# Patient Record
Sex: Female | Born: 1950 | Race: White | Hispanic: No | Smoking: Never smoker
Health system: Southern US, Community
[De-identification: ages and names within clinical notes are randomized; demographics above are authoritative.]

## PROBLEM LIST (undated history)

## (undated) DIAGNOSIS — F410 Panic disorder [episodic paroxysmal anxiety] without agoraphobia: Secondary | ICD-10-CM

## (undated) DIAGNOSIS — F411 Generalized anxiety disorder: Secondary | ICD-10-CM

## (undated) DIAGNOSIS — I10 Essential (primary) hypertension: Secondary | ICD-10-CM

## (undated) DIAGNOSIS — M5430 Sciatica, unspecified side: Secondary | ICD-10-CM

## (undated) DIAGNOSIS — E119 Type 2 diabetes mellitus without complications: Secondary | ICD-10-CM

## (undated) DIAGNOSIS — E039 Hypothyroidism, unspecified: Secondary | ICD-10-CM

## (undated) DIAGNOSIS — M109 Gout, unspecified: Secondary | ICD-10-CM

## (undated) DIAGNOSIS — E079 Disorder of thyroid, unspecified: Secondary | ICD-10-CM

## (undated) DIAGNOSIS — I82409 Acute embolism and thrombosis of unspecified deep veins of unspecified lower extremity: Secondary | ICD-10-CM

## (undated) HISTORY — PX: TONSILLECTOMY: SUR1361

## (undated) HISTORY — PX: CHOLECYSTECTOMY: SHX55

## (undated) HISTORY — PX: ABDOMINAL HYSTERECTOMY: SHX81

---

## 2005-07-05 ENCOUNTER — Emergency Department: Payer: Self-pay | Admitting: Emergency Medicine

## 2005-08-22 ENCOUNTER — Ambulatory Visit (HOSPITAL_BASED_OUTPATIENT_CLINIC_OR_DEPARTMENT_OTHER): Admission: RE | Admit: 2005-08-22 | Discharge: 2005-08-22 | Payer: Self-pay | Admitting: Orthopedic Surgery

## 2006-09-30 ENCOUNTER — Ambulatory Visit: Payer: Self-pay | Admitting: Emergency Medicine

## 2006-10-16 ENCOUNTER — Ambulatory Visit: Payer: Self-pay | Admitting: Internal Medicine

## 2011-08-15 ENCOUNTER — Emergency Department (HOSPITAL_BASED_OUTPATIENT_CLINIC_OR_DEPARTMENT_OTHER): Payer: Self-pay

## 2011-08-15 ENCOUNTER — Encounter (HOSPITAL_BASED_OUTPATIENT_CLINIC_OR_DEPARTMENT_OTHER): Payer: Self-pay | Admitting: *Deleted

## 2011-08-15 ENCOUNTER — Emergency Department (HOSPITAL_BASED_OUTPATIENT_CLINIC_OR_DEPARTMENT_OTHER)
Admission: EM | Admit: 2011-08-15 | Discharge: 2011-08-16 | Disposition: A | Discharge status: Home / Self Care | Payer: Self-pay | Attending: Emergency Medicine | Admitting: Emergency Medicine

## 2011-08-15 DIAGNOSIS — S91109A Unspecified open wound of unspecified toe(s) without damage to nail, initial encounter: Secondary | ICD-10-CM | POA: Insufficient documentation

## 2011-08-15 DIAGNOSIS — IMO0002 Reserved for concepts with insufficient information to code with codable children: Secondary | ICD-10-CM | POA: Insufficient documentation

## 2011-08-15 DIAGNOSIS — L539 Erythematous condition, unspecified: Secondary | ICD-10-CM | POA: Insufficient documentation

## 2011-08-15 DIAGNOSIS — M109 Gout, unspecified: Secondary | ICD-10-CM | POA: Insufficient documentation

## 2011-08-15 DIAGNOSIS — M79609 Pain in unspecified limb: Secondary | ICD-10-CM | POA: Insufficient documentation

## 2011-08-15 HISTORY — DX: Disorder of thyroid, unspecified: E07.9

## 2011-08-15 NOTE — ED Notes (Signed)
Pt c/o left toe pain and injury x last week

## 2011-08-15 NOTE — ED Notes (Signed)
MD at bedside. 

## 2011-08-15 NOTE — ED Provider Notes (Signed)
History     CSN: [id number]  Arrival date & time 08/15/11  2241   First MD Initiated Contact with Patient 08/15/11 2344      Chief Complaint  Patient presents with  . Toe Injury    (Consider location/radiation/quality/duration/timing/severity/associated sxs/prior treatment) HPI This is a 61 year old white female who had her left great toe stepped on about 2 weeks ago. This partially avulse the nail of that toe. She has been treating her toe with elevation and wearing nonconstrictive shoes. Pain in that toe had been improving until this afternoon when she developed severe, burning pain in the left first metatarsophalangeal joint. The pain is worse with movement of the toe, palpation of the joint or ambulation. There is some mild erythema associated with this but no ecchymosis, swelling or warmth. She denies new injury. She has no history of gout.  Past Medical History  Diagnosis Date  . Thyroid disease     Past Surgical History  Procedure Date  . Abdominal hysterectomy   . Tonsillectomy   . Cholecystectomy     History reviewed. No pertinent family history.  History  Substance Use Topics  . Smoking status: Never Smoker   . Smokeless tobacco: Not on file  . Alcohol Use: No    OB History    Grav Para Term Preterm Abortions TAB SAB Ect Mult Living                  Review of Systems  All other systems reviewed and are negative.    Allergies  Ivp dye; Penicillins; and Sulfa antibiotics  Home Medications   Current Outpatient Rx  Name Route Sig Dispense Refill  . THYROID 60 MG PO TABS Oral Take 60 mg by mouth daily.      BP 188/87  Pulse 93  Temp 98.3 F (36.8 C) (Oral)  Ht 5\' 7"  (1.702 m)  Wt 218 lb (98.884 kg)  BMI 34.14 kg/m2  SpO2 100%  Physical Exam General: Well-developed, well-nourished female in no acute distress; appearance consistent with age of record HENT: normocephalic, atraumatic Eyes: Normal appearance Neck: supple Heart: regular rate  and rhythm Lungs: Normal respiratory effort and excursion Abdomen: soft; nondistended Extremities: No deformity; partial avulsion of the left first toenail without significant tenderness; moderate to severe tenderness of the left first metatarsophalangeal joint without warmth, erythema, swelling or ecchymosis Neurologic: Awake, alert and oriented; motor function intact in all extremities and symmetric; no facial droop Skin: Warm and dry Psychiatric: Anxious    ED Course  Procedures (including critical care time)     MDM  Nursing notes and vitals signs, including pulse oximetry, reviewed.  Summary of this visit's results, reviewed by myself:  Results for orders placed during the hospital encounter of 08/15/11  URIC ACID      Component Value Range   Uric Acid, Serum 7.4 (*) 2.4 - 7.0 mg/dL     Imaging Studies: Dg Foot Complete Left  08/15/2011  *RADIOLOGY REPORT*  Clinical Data: Left great toe stepped on by steel-toed boot; burning and pain on palpation.  LEFT FOOT - COMPLETE 3+ VIEW  Comparison: None.  Findings: There is no evidence of fracture or dislocation.  The joint spaces are preserved.  There is no evidence of talar subluxation; the subtalar joint is unremarkable in appearance.  A small os peroneum is noted.  The left great toe is grossly unremarkable in appearance.  No significant soft tissue abnormalities are seen.  IMPRESSION:  1.  No evidence of  fracture or dislocation. 2.  Small os peroneum noted.  Original Report Authenticated By: Tonia Ghent, M.D.   Suspect patient's acute symptoms are due to gout rather than a complication of the patient's distal phalangeal fracture.         Hanley Seamen, MD 08/16/11 [phone]

## 2011-08-16 LAB — URIC ACID: Uric Acid, Serum: 7.4 mg/dL — ABNORMAL HIGH (ref 2.4–7.0)

## 2011-08-16 MED ORDER — NAPROXEN SODIUM 220 MG PO TABS: ORAL_TABLET | ORAL | Status: DC

## 2011-08-16 MED ORDER — HYDROCODONE-ACETAMINOPHEN 5-325 MG PO TABS: 1.0000 | ORAL_TABLET | Freq: Four times a day (QID) | ORAL | Status: AC | PRN

## 2011-08-16 MED ORDER — KETOROLAC TROMETHAMINE 60 MG/2ML IM SOLN
60.0000 mg | Freq: Once | INTRAMUSCULAR | Status: AC
  Administered 2011-08-16: 60 mg via INTRAMUSCULAR
  Filled 2011-08-16: qty 2

## 2011-08-16 NOTE — ED Notes (Signed)
MD at bedside. 

## 2011-08-30 ENCOUNTER — Emergency Department (HOSPITAL_BASED_OUTPATIENT_CLINIC_OR_DEPARTMENT_OTHER)
Admission: EM | Admit: 2011-08-30 | Discharge: 2011-08-31 | Disposition: A | Discharge status: Home / Self Care | Payer: Self-pay | Attending: Emergency Medicine | Admitting: Emergency Medicine

## 2011-08-30 ENCOUNTER — Emergency Department (HOSPITAL_BASED_OUTPATIENT_CLINIC_OR_DEPARTMENT_OTHER): Payer: Self-pay

## 2011-08-30 ENCOUNTER — Encounter (HOSPITAL_BASED_OUTPATIENT_CLINIC_OR_DEPARTMENT_OTHER): Payer: Self-pay

## 2011-08-30 DIAGNOSIS — R0602 Shortness of breath: Secondary | ICD-10-CM | POA: Insufficient documentation

## 2011-08-30 DIAGNOSIS — M546 Pain in thoracic spine: Secondary | ICD-10-CM | POA: Insufficient documentation

## 2011-08-30 DIAGNOSIS — Z7982 Long term (current) use of aspirin: Secondary | ICD-10-CM | POA: Insufficient documentation

## 2011-08-30 DIAGNOSIS — M549 Dorsalgia, unspecified: Secondary | ICD-10-CM

## 2011-08-30 DIAGNOSIS — E079 Disorder of thyroid, unspecified: Secondary | ICD-10-CM | POA: Insufficient documentation

## 2011-08-30 HISTORY — DX: Panic disorder (episodic paroxysmal anxiety): F41.0

## 2011-08-30 HISTORY — DX: Gout, unspecified: M10.9

## 2011-08-30 LAB — CBC WITH DIFFERENTIAL/PLATELET
HCT: 35.5 % — ABNORMAL LOW (ref 36.0–46.0)
Hemoglobin: 12.2 g/dL (ref 12.0–15.0)
Lymphocytes Relative: 39 % (ref 12–46)
Lymphs Abs: 3.1 10*3/uL (ref 0.7–4.0)
Monocytes Absolute: 0.6 10*3/uL (ref 0.1–1.0)
Monocytes Relative: 7 % (ref 3–12)
Neutro Abs: 3.4 10*3/uL (ref 1.7–7.7)
Neutrophils Relative %: 44 % (ref 43–77)
RBC: 3.74 MIL/uL — ABNORMAL LOW (ref 3.87–5.11)

## 2011-08-30 LAB — COMPREHENSIVE METABOLIC PANEL
Albumin: 3.8 g/dL (ref 3.5–5.2)
Alkaline Phosphatase: 88 U/L (ref 39–117)
BUN: 13 mg/dL (ref 6–23)
CO2: 25 mEq/L (ref 19–32)
Chloride: 102 mEq/L (ref 96–112)
Creatinine, Ser: 1 mg/dL (ref 0.50–1.10)
GFR calc non Af Amer: 61 mL/min — ABNORMAL LOW (ref >90)
Glucose, Bld: 173 mg/dL — ABNORMAL HIGH (ref 70–99)
Potassium: 3.3 mEq/L — ABNORMAL LOW (ref 3.5–5.1)
Total Bilirubin: 0.6 mg/dL (ref 0.3–1.2)

## 2011-08-30 LAB — TROPONIN I: Troponin I: <0.3 ng/mL (ref <0.30)

## 2011-08-30 MED ORDER — DIPHENHYDRAMINE HCL 50 MG/ML IJ SOLN
25.0000 mg | Freq: Once | INTRAMUSCULAR | Status: AC
  Administered 2011-08-30: 50 mg via INTRAVENOUS
  Filled 2011-08-30: qty 1

## 2011-08-30 MED ORDER — DIPHENHYDRAMINE HCL 50 MG/ML IJ SOLN
25.0000 mg | Freq: Once | INTRAMUSCULAR | Status: AC
  Administered 2011-08-30: 50 mg via INTRAVENOUS

## 2011-08-30 MED ORDER — HYDROCORTISONE SOD SUCCINATE 100 MG IJ SOLR
200.0000 mg | Freq: Once | INTRAMUSCULAR | Status: AC
  Administered 2011-08-30: 200 mg via INTRAVENOUS
  Filled 2011-08-30 (×2): qty 2

## 2011-08-30 MED ORDER — ONDANSETRON HCL 4 MG/2ML IJ SOLN
4.0000 mg | Freq: Once | INTRAMUSCULAR | Status: AC
  Administered 2011-08-30: 4 mg via INTRAVENOUS
  Filled 2011-08-30: qty 2

## 2011-08-30 MED ORDER — MORPHINE SULFATE 4 MG/ML IJ SOLN
4.0000 mg | Freq: Once | INTRAMUSCULAR | Status: AC
  Administered 2011-08-30: 4 mg via INTRAVENOUS
  Filled 2011-08-30: qty 1

## 2011-08-30 NOTE — ED Provider Notes (Signed)
History     CSN: [id number]  Arrival date & time 08/30/11  4098   First MD Initiated Contact with Patient 08/30/11 2035      Chief Complaint  Patient presents with  . Back Pain    (Consider location/radiation/quality/duration/timing/severity/associated sxs/prior treatment) HPI Pt presents with c/o pain in upper back between her shoulder blades. She states the pain has been present for the past 24 hours, pain is worse with movement and palpation.  She denies any injury.  Pt also states the pain is sharp and worse with deep breathing and cough.  No fevers or productive cough.  No leg swelling.  No hx of DVT/PE, no recent travel or trauma or surgeries, no hormone replacement use.  Denies chest pain or difficulty breathing. Pt very anxious about these symptoms.  Has not taken anything for symptoms prior to arrival.  There are no other associated systemic symptoms, there are no other alleviating or modifying factors.   Past Medical History  Diagnosis Date  . Thyroid disease   . Panic attacks   . Gout     Past Surgical History  Procedure Date  . Abdominal hysterectomy   . Tonsillectomy   . Cholecystectomy     No family history on file.  History  Substance Use Topics  . Smoking status: Never Smoker   . Smokeless tobacco: Not on file  . Alcohol Use: No    OB History    Grav Para Term Preterm Abortions TAB SAB Ect Mult Living                  Review of Systems ROS reviewed and all otherwise negative except for mentioned in HPI  Allergies  Bee venom; Penicillins; Sulfa antibiotics; Hydrocodone; Ivp dye; and Mango flavor  Home Medications   Current Outpatient Rx  Name Route Sig Dispense Refill  . ASPIRIN EC 81 MG PO TBEC Oral Take 81 mg by mouth once.    Marland Kitchen EPINEPHRINE 0.3 MG/0.3ML IJ DEVI Intramuscular Inject 0.3 mg into the muscle as needed. For allergic reaction    . ADULT MULTIVITAMIN W/MINERALS CH Oral Take 1 tablet by mouth daily.    Marland Kitchen NAPROXEN SODIUM 220 MG PO  TABS  Take 2 tablets every 12 hours as needed for pain. Best taken an full stomach.    . THYROID 60 MG PO TABS Oral Take 120 mg by mouth every morning.     . CYCLOBENZAPRINE HCL 5 MG PO TABS Oral Take 1 tablet (5 mg total) by mouth 3 (three) times daily as needed for muscle spasms. 20 tablet 0  . IBUPROFEN 600 MG PO TABS Oral Take 1 tablet (600 mg total) by mouth every 6 (six) hours as needed for pain. 30 tablet 0  . ONDANSETRON HCL 4 MG PO TABS Oral Take 1 tablet (4 mg total) by mouth every 6 (six) hours. 12 tablet 0  . OXYCODONE-ACETAMINOPHEN 5-325 MG PO TABS Oral Take 1-2 tablets by mouth every 6 (six) hours as needed for pain. 15 tablet 0    BP 165/76  Pulse 83  Temp 98.1 F (36.7 C) (Oral)  Resp 18  Ht 5\' 7"  (1.702 m)  Wt 220 lb (99.791 kg)  BMI 34.46 kg/m2  SpO2 96% Vitals reviewed Physical Exam Physical Examination: General appearance - alert, concerned appearing, and in no distress Mental status - alert, oriented to person, place, and time Eyes - no conjunctival injection, no scleral icterus Mouth - mucous membranes moist, pharynx normal without lesions  Chest - clear to auscultation, no wheezes, rales or rhonchi, symmetric air entry Heart - normal rate, regular rhythm, normal S1, S2, no murmurs, rubs, clicks or gallops Abdomen - soft, nontender, nondistended, no masses or organomegaly Back exam - no midline tenderness, ttp over right paraspinous thoracic region and under shoulder blade on right, no CVA tenderness Extremities - peripheral pulses normal, no pedal edema, no clubbing or cyanosis Skin - normal coloration and turgor, no rashes, warm and dry Psych- anxious, tearful, but cooperative  ED Course  Procedures (including critical care time)   Date: 08/30/2011  Rate: 86  Rhythm: normal sinus rhythm  QRS Axis: normal  Intervals: normal  ST/T Wave abnormalities: normal  Conduction Disutrbances: none  Narrative Interpretation: unremarkable, no significant change  compared to prior ekg of 2007     Labs Reviewed  CBC WITH DIFFERENTIAL - Abnormal; Notable for the following:    RBC 3.74 (*)     HCT 35.5 (*)     Eosinophils Relative 8 (*)     All other components within normal limits  COMPREHENSIVE METABOLIC PANEL - Abnormal; Notable for the following:    Potassium 3.3 (*)     Glucose, Bld 173 (*)     GFR calc non Af Amer 61 (*)     GFR calc Af Amer 71 (*)     All other components within normal limits  D-DIMER, QUANTITATIVE - Abnormal; Notable for the following:    D-Dimer, Quant 1.12 (*)     All other components within normal limits  TROPONIN I   Dg Chest 2 View  08/30/2011  *RADIOLOGY REPORT*  Clinical Data: Intermittent interscapular pain for 1 week worsening with deep breathing and coughing.  Mild shortness of breath.  CHEST - 2 VIEW  Comparison: None.  Findings: The heart size and mediastinal contours are normal. There is mild central airway thickening without overt pulmonary edema or confluent airspace opacity.  There is a small calcified granuloma in the left lower lobe.  No pleural effusion or pneumothorax is seen.  There are scattered degenerative changes of the spine.  No acute osseous or paraspinal abnormalities are identified.  Telemetry leads are noted.  IMPRESSION: No acute cardiopulmonary process.  Central airway thickening and left lower lobe granuloma noted.  Original Report Authenticated By: Gerrianne Scale, M.D.   Ct Angio Chest W/cm &/or Wo Cm  08/31/2011  *RADIOLOGY REPORT*  Clinical Data: Shortness of breath, chest pain and back pain.  CT ANGIOGRAPHY CHEST  Technique:  Multidetector CT imaging of the chest using the standard protocol during bolus administration of intravenous contrast. Multiplanar reconstructed images including MIPs were obtained and reviewed to evaluate the vascular anatomy.  Contrast: 80mL OMNIPAQUE IOHEXOL 350 MG/ML SOLN the patient was emergently premedicated with IV steroids and Benadryl prior to the study  due to history of prior contrast allergy.  Comparison: None.  Findings: The pulmonary arteries are adequately opacified.  There is no evidence of pulmonary embolism.  The thoracic aorta is also well opacified and shows no evidence of aneurysmal disease or dissection.  The lungs show scattered scarring and bibasilar atelectasis.  There is a densely calcified granuloma in the left lower lobe measuring approximately 8 mm.  No evidence of pulmonary consolidation, edema or pleural fluid.  Heart size is normal.  No pericardial fluid identified.  No enlarged lymph nodes.  The visualized airway and bronchi are normally patent.  Degenerative changes are present in the spine.  IMPRESSION: No evidence of pulmonary  embolism or other acute process in the chest.  Original Report Authenticated By: Reola Calkins, M.D.     1. Pain, upper back       MDM  Pt presenting with pain in right upper back- seems most c/w muscular pain on examination, but patient with c/o pleuritic nature of symptoms also and quite uncomfortable appearing, d-dimer obtained and elevated.  CT angio ordered. Pt states she has a "sensitivity, not an allergy" to IV contrast and that she needs benadryl before hand.  States she has had benadryl before IV contrast in the past and had no adverse reactions.  Tech d/w radiologist- I have premedicated her with hydrocortisone and benadryl prior to obtaining CT angio.  Other labs and workup reassuring.  Pt's pain improved after pain meds.  If no PE on scan, will treat with ibuprofen, pain meds and muscle relaxer.    12:29 AM  NO PE or other acute process on CT scan.  Of note, patient had NO adverse reaction to the IV contrast.         Ethelda Chick, MD 08/31/11 0030

## 2011-08-30 NOTE — ED Notes (Addendum)
C/o pain between shoulder blades intermittent x 1 week-pt was at [employer] yesterday/had increase in pain last night-drove self here today-c/o jaw feels clenched and nausea-pt drove self to ED-called en route and requested assist upon arrival-pt was assisted by this RN and A Bayse, RN from truck in parking lot to w/c to ED

## 2011-08-30 NOTE — ED Notes (Signed)
Patient transported to X-ray 

## 2011-08-30 NOTE — ED Notes (Signed)
Pt reports intermittent pain in between shoulder blades x 1 week, but has been constant x the past 24hours. Worsens with deep breathing or coughing. Mild SOB-intermittent. Also c/o "flashing lights" in her field of vision, bilateral jaw cramping since yesterday, nausea with no vomiting. Chills, and "fluid on my right side". Pt appears very anxious during exam.

## 2011-08-30 NOTE — ED Notes (Signed)
Pt aware of plan to premedicate with benadryl and solucortef and wait two hours prior to having CT with contrast. Pt reported a reaction involving possible anaphylaxis with a previous scan that had contrast dye

## 2011-08-31 MED ORDER — ONDANSETRON HCL 4 MG PO TABS: 4.0000 mg | ORAL_TABLET | Freq: Four times a day (QID) | ORAL | Status: AC

## 2011-08-31 MED ORDER — OXYCODONE-ACETAMINOPHEN 5-325 MG PO TABS: 1.0000 | ORAL_TABLET | Freq: Four times a day (QID) | ORAL | Status: AC | PRN

## 2011-08-31 MED ORDER — CYCLOBENZAPRINE HCL 5 MG PO TABS: 5.0000 mg | ORAL_TABLET | Freq: Three times a day (TID) | ORAL | Status: AC | PRN

## 2011-08-31 MED ORDER — IBUPROFEN 600 MG PO TABS: 600.0000 mg | ORAL_TABLET | Freq: Four times a day (QID) | ORAL | Status: AC | PRN

## 2011-08-31 MED ORDER — IOHEXOL 350 MG/ML SOLN
80.0000 mL | Freq: Once | INTRAVENOUS | Status: AC | PRN
  Administered 2011-08-31: 80 mL via INTRAVENOUS

## 2013-12-21 DIAGNOSIS — E1165 Type 2 diabetes mellitus with hyperglycemia: Secondary | ICD-10-CM | POA: Insufficient documentation

## 2014-02-05 HISTORY — PX: BACK SURGERY: SHX140

## 2014-03-21 ENCOUNTER — Emergency Department (HOSPITAL_COMMUNITY): Payer: BLUE CROSS/BLUE SHIELD

## 2014-03-21 ENCOUNTER — Emergency Department (HOSPITAL_COMMUNITY)
Admission: EM | Admit: 2014-03-21 | Discharge: 2014-03-21 | Disposition: A | Discharge status: Home / Self Care | Payer: BLUE CROSS/BLUE SHIELD | Attending: Emergency Medicine | Admitting: Emergency Medicine

## 2014-03-21 ENCOUNTER — Encounter (HOSPITAL_COMMUNITY): Payer: Self-pay | Admitting: Emergency Medicine

## 2014-03-21 DIAGNOSIS — R079 Chest pain, unspecified: Secondary | ICD-10-CM | POA: Diagnosis present

## 2014-03-21 DIAGNOSIS — F419 Anxiety disorder, unspecified: Secondary | ICD-10-CM | POA: Diagnosis not present

## 2014-03-21 DIAGNOSIS — Z79899 Other long term (current) drug therapy: Secondary | ICD-10-CM | POA: Diagnosis not present

## 2014-03-21 DIAGNOSIS — E079 Disorder of thyroid, unspecified: Secondary | ICD-10-CM | POA: Insufficient documentation

## 2014-03-21 DIAGNOSIS — R0789 Other chest pain: Secondary | ICD-10-CM | POA: Diagnosis not present

## 2014-03-21 DIAGNOSIS — M791 Myalgia: Secondary | ICD-10-CM | POA: Insufficient documentation

## 2014-03-21 DIAGNOSIS — Z880 Allergy status to penicillin: Secondary | ICD-10-CM | POA: Insufficient documentation

## 2014-03-21 DIAGNOSIS — T148XXA Other injury of unspecified body region, initial encounter: Secondary | ICD-10-CM

## 2014-03-21 DIAGNOSIS — Z7982 Long term (current) use of aspirin: Secondary | ICD-10-CM | POA: Diagnosis not present

## 2014-03-21 LAB — BASIC METABOLIC PANEL
Anion gap: 11 (ref 5–15)
BUN: 22 mg/dL (ref 6–23)
CALCIUM: 9.7 mg/dL (ref 8.4–10.5)
CO2: 24 mmol/L (ref 19–32)
CREATININE: 0.96 mg/dL (ref 0.50–1.10)
Chloride: 101 mmol/L (ref 96–112)
GFR calc Af Amer: 72 mL/min — ABNORMAL LOW (ref >90)
GFR calc non Af Amer: 63 mL/min — ABNORMAL LOW (ref >90)
Glucose, Bld: 127 mg/dL — ABNORMAL HIGH (ref 70–99)
POTASSIUM: 3.5 mmol/L (ref 3.5–5.1)
Sodium: 136 mmol/L (ref 135–145)

## 2014-03-21 LAB — I-STAT TROPONIN, ED
Troponin i, poc: 0.01 ng/mL (ref 0.00–0.08)
Troponin i, poc: 0.01 ng/mL (ref 0.00–0.08)

## 2014-03-21 LAB — CBC
HCT: 38.2 % (ref 36.0–46.0)
Hemoglobin: 12.9 g/dL (ref 12.0–15.0)
MCH: 31.9 pg (ref 26.0–34.0)
MCHC: 33.8 g/dL (ref 30.0–36.0)
MCV: 94.3 fL (ref 78.0–100.0)
Platelets: 270 10*3/uL (ref 150–400)
RBC: 4.05 MIL/uL (ref 3.87–5.11)
RDW: 13.2 % (ref 11.5–15.5)
WBC: 7.4 10*3/uL (ref 4.0–10.5)

## 2014-03-21 LAB — BRAIN NATRIURETIC PEPTIDE: B Natriuretic Peptide: 18.2 pg/mL (ref 0.0–100.0)

## 2014-03-21 LAB — LIPASE, BLOOD: Lipase: 75 U/L — ABNORMAL HIGH (ref 11–59)

## 2014-03-21 MED ORDER — METHOCARBAMOL 500 MG PO TABS
1000.0000 mg | ORAL_TABLET | Freq: Once | ORAL | Status: AC
  Administered 2014-03-21: 1000 mg via ORAL
  Filled 2014-03-21: qty 2

## 2014-03-21 MED ORDER — METHOCARBAMOL 500 MG PO TABS: 1000.0000 mg | ORAL_TABLET | Freq: Three times a day (TID) | ORAL | Status: DC | PRN

## 2014-03-21 NOTE — Discharge Instructions (Signed)
Chest Pain (Nonspecific) °It is often hard to give a specific diagnosis for the cause of chest pain. There is always a chance that your pain could be related to something serious, such as a heart attack or a blood clot in the lungs. You need to follow up with your health care provider for further evaluation. °CAUSES  °· Heartburn. °· Pneumonia or bronchitis. °· Anxiety or stress. °· Inflammation around your heart (pericarditis) or lung (pleuritis or pleurisy). °· A blood clot in the lung. °· A collapsed lung (pneumothorax). It can develop suddenly on its own (spontaneous pneumothorax) or from trauma to the chest. °· Shingles infection (herpes zoster virus). °The chest wall is composed of bones, muscles, and cartilage. Any of these can be the source of the pain. °· The bones can be bruised by injury. °· The muscles or cartilage can be strained by coughing or overwork. °· The cartilage can be affected by inflammation and become sore (costochondritis). °DIAGNOSIS  °Lab tests or other studies may be needed to find the cause of your pain. Your health care provider may have you take a test called an ambulatory electrocardiogram (ECG). An ECG records your heartbeat patterns over a 24-hour period. You may also have other tests, such as: °· Transthoracic echocardiogram (TTE). During echocardiography, sound waves are used to evaluate how blood flows through your heart. °· Transesophageal echocardiogram (TEE). °· Cardiac monitoring. This allows your health care provider to monitor your heart rate and rhythm in real time. °· Holter monitor. This is a portable device that records your heartbeat and can help diagnose heart arrhythmias. It allows your health care provider to track your heart activity for several days, if needed. °· Stress tests by exercise or by giving medicine that makes the heart beat faster. °TREATMENT  °· Treatment depends on what may be causing your chest pain. Treatment may include: °· Acid blockers for  heartburn. °· Anti-inflammatory medicine. °· Pain medicine for inflammatory conditions. °· Antibiotics if an infection is present. °· You may be advised to change lifestyle habits. This includes stopping smoking and avoiding alcohol, caffeine, and chocolate. °· You may be advised to keep your head raised (elevated) when sleeping. This reduces the chance of acid going backward from your stomach into your esophagus. °Most of the time, nonspecific chest pain will improve within 2-3 days with rest and mild pain medicine.  °HOME CARE INSTRUCTIONS  °· If antibiotics were prescribed, take them as directed. Finish them even if you start to feel better. °· For the next few days, avoid physical activities that bring on chest pain. Continue physical activities as directed. °· Do not use any tobacco products, including cigarettes, chewing tobacco, or electronic cigarettes. °· Avoid drinking alcohol. °· Only take medicine as directed by your health care provider. °· Follow your health care provider's suggestions for further testing if your chest pain does not go away. °· Keep any follow-up appointments you made. If you do not go to an appointment, you could develop lasting (chronic) problems with pain. If there is any problem keeping an appointment, call to reschedule. °SEEK MEDICAL CARE IF:  °· Your chest pain does not go away, even after treatment. °· You have a rash with blisters on your chest. °· You have a fever. °SEEK IMMEDIATE MEDICAL CARE IF:  °· You have increased chest pain or pain that spreads to your arm, neck, jaw, back, or abdomen. °· You have shortness of breath. °· You have an increasing cough, or you cough   up blood.  You have severe back or abdominal pain.  You feel nauseous or vomit.  You have severe weakness.  You faint.  You have chills. This is an emergency. Do not wait to see if the pain will go away. Get medical help at once. Call your local emergency services (911 in [place] Do not drive  yourself to the hospital. MAKE SURE YOU:   Understand these instructions.  Will watch your condition.  Will get help right away if you are not doing well or get worse. Document Released: 11/01/2004 Document Revised: 01/27/2013 Document Reviewed: 08/28/2007 [facility] Patient Information 2015 [place], [place]. This information is not intended to replace advice given to you by your health care provider. Make sure you discuss any questions you have with your health care provider.  Muscle Strain A muscle strain is an injury that occurs when a muscle is stretched beyond its normal length. Usually a small number of muscle fibers are torn when this happens. Muscle strain is rated in degrees. First-degree strains have the least amount of muscle fiber tearing and pain. Second-degree and third-degree strains have increasingly more tearing and pain.  Usually, recovery from muscle strain takes 1-2 weeks. Complete healing takes 5-6 weeks.  CAUSES  Muscle strain happens when a sudden, violent force placed on a muscle stretches it too far. This may occur with lifting, sports, or a fall.  RISK FACTORS Muscle strain is especially common in athletes.  SIGNS AND SYMPTOMS At the site of the muscle strain, there may be:  Pain.  Bruising.  Swelling.  Difficulty using the muscle due to pain or lack of normal function. DIAGNOSIS  Your health care provider will perform a physical exam and ask about your medical history. TREATMENT  Often, the best treatment for a muscle strain is resting, icing, and applying cold compresses to the injured area.  HOME CARE INSTRUCTIONS   Use the PRICE method of treatment to promote muscle healing during the first 2-3 days after your injury. The PRICE method involves:  Protecting the muscle from being injured again.  Restricting your activity and resting the injured body part.  Icing your injury. To do this, put ice in a plastic bag. Place a towel between your skin and the  bag. Then, apply the ice and leave it on from 15-20 minutes each hour. After the third day, switch to moist heat packs.  Apply compression to the injured area with a splint or elastic bandage. Be careful not to wrap it too tightly. This may interfere with blood circulation or increase swelling.  Elevate the injured body part above the level of your heart as often as you can.  Only take over-the-counter or prescription medicines for pain, discomfort, or fever as directed by your health care provider.  Warming up prior to exercise helps to prevent future muscle strains. SEEK MEDICAL CARE IF:   You have increasing pain or swelling in the injured area.  You have numbness, tingling, or a significant loss of strength in the injured area. MAKE SURE YOU:   Understand these instructions.  Will watch your condition.  Will get help right away if you are not doing well or get worse. Document Released: 01/22/2005 Document Revised: 11/12/2012 Document Reviewed: 08/21/2012 [facility] Patient Information 2015 [place], [place]. This information is not intended to replace advice given to you by your health care provider. Make sure you discuss any questions you have with your health care provider.  Panic Attacks Panic attacks are sudden, short-livedsurges of  severe anxiety, fear, or discomfort. They may occur for no reason when you are relaxed, when you are anxious, or when you are sleeping. Panic attacks may occur for a number of reasons:   Healthy people occasionally have panic attacks in extreme, life-threatening situations, such as war or natural disasters. Normal anxiety is a protective mechanism of the body that helps us react to danger (fight or flight response).  Panic attacks are often seen with anxiety disorders, such as panic disorder, social anxiety disorder, generalized anxiety disorder, and phobias. Anxiety disorders cause excessive or uncontrollable anxiety. They may interfere with your  relationships or other life activities.  Panic attacks are sometimes seen with other mental illnesses, such as depression and posttraumatic stress disorder.  Certain medical conditions, prescription medicines, and drugs of abuse can cause panic attacks. SYMPTOMS  Panic attacks start suddenly, peak within 20 minutes, and are accompanied by four or more of the following symptoms:  Pounding heart or fast heart rate (palpitations).  Sweating.  Trembling or shaking.  Shortness of breath or feeling smothered.  Feeling choked.  Chest pain or discomfort.  Nausea or strange feeling in your stomach.  Dizziness, light-headedness, or feeling like you will faint.  Chills or hot flushes.  Numbness or tingling in your lips or hands and feet.  Feeling that things are not real or feeling that you are not yourself.  Fear of losing control or going crazy.  Fear of dying. Some of these symptoms can mimic serious medical conditions. For example, you may think you are having a heart attack. Although panic attacks can be very scary, they are not life threatening. DIAGNOSIS  Panic attacks are diagnosed through an assessment by your health care provider. Your health care provider will ask questions about your symptoms, such as where and when they occurred. Your health care provider will also ask about your medical history and use of alcohol and drugs, including prescription medicines. Your health care provider may order blood tests or other studies to rule out a serious medical condition. Your health care provider may refer you to a mental health professional for further evaluation. TREATMENT   Most healthy people who have one or two panic attacks in an extreme, life-threatening situation will not require treatment.  The treatment for panic attacks associated with anxiety disorders or other mental illness typically involves counseling with a mental health professional, medicine, or a combination of  both. Your health care provider will help determine what treatment is best for you.  Panic attacks due to physical illness usually go away with treatment of the illness. If prescription medicine is causing panic attacks, talk with your health care provider about stopping the medicine, decreasing the dose, or substituting another medicine.  Panic attacks due to alcohol or drug abuse go away with abstinence. Some adults need professional help in order to stop drinking or using drugs. HOME CARE INSTRUCTIONS   Take all medicines as directed by your health care provider.   Schedule and attend follow-up visits as directed by your health care provider. It is important to keep all your appointments. SEEK MEDICAL CARE IF:  You are not able to take your medicines as prescribed.  Your symptoms do not improve or get worse. SEEK IMMEDIATE MEDICAL CARE IF:   You experience panic attack symptoms that are different than your usual symptoms.  You have serious thoughts about hurting yourself or others.  You are taking medicine for panic attacks and have a serious side effect. MAKE SURE  YOU:  Understand these instructions.  Will watch your condition.  Will get help right away if you are not doing well or get worse. Document Released: 01/22/2005 Document Revised: 01/27/2013 Document Reviewed: 09/05/2012 [facility] Patient Information 2015 [place], [place]. This information is not intended to replace advice given to you by your health care provider. Make sure you discuss any questions you have with your health care provider.

## 2014-03-21 NOTE — ED Notes (Signed)
Pt. reports left chest pain ( under breast ) radiating to left arm and upper back this evening with mild SOB and nausea.

## 2014-03-21 NOTE — ED Provider Notes (Signed)
CSN: [id number]     Arrival date & time 03/21/14  0239 History   First MD Initiated Contact with Patient 03/21/14 0402     Chief Complaint  Patient presents with  . Chest Pain     (Consider location/radiation/quality/duration/timing/severity/associated sxs/prior Treatment) HPI Patient states she's been under increased stress as of late. This evening she had a sharp left-sided chest pain described as a stab. He was under the left breast. It lasted less than 1 minute. Has completely resolved. Patient became anxious and developed mild shortness of breath. Since that time patient also complains of left sided scapular pain. She describes this pain as dull. It is worse with movement or palpation. She currently has no shortness of breath. No lower extremity swelling or pain. Patient has a history of anxiety disorder for which she takes Xanax twice daily. She is tearful in the room when discussing her mother-in-law.   Past Medical History  Diagnosis Date  . Thyroid disease   . Panic attacks   . Gout    Past Surgical History  Procedure Laterality Date  . Abdominal hysterectomy    . Tonsillectomy    . Cholecystectomy     No family history on file. History  Substance Use Topics  . Smoking status: Never Smoker   . Smokeless tobacco: Not on file  . Alcohol Use: No   OB History    No data available     Review of Systems  Constitutional: Positive for fatigue. Negative for fever and chills.  Respiratory: Positive for shortness of breath. Negative for cough.   Cardiovascular: Positive for chest pain. Negative for palpitations and leg swelling.  Gastrointestinal: Positive for nausea and abdominal pain. Negative for vomiting and diarrhea.  Musculoskeletal: Positive for myalgias and back pain. Negative for neck pain and neck stiffness.  Skin: Negative for rash and wound.  Neurological: Negative for dizziness, weakness, light-headedness, numbness and headaches.  All other systems reviewed and  are negative.     Allergies  Bee venom; Penicillins; Sulfa antibiotics; Hydrocodone; Ivp dye; and Mango flavor  Home Medications   Prior to Admission medications   Medication Sig Start Date End Date Taking? Authorizing Provider  ALPRAZolam Prudy Feeler) 1 MG tablet Take 1 mg by mouth 2 (two) times daily.   Yes Historical Provider, MD  aspirin EC 81 MG tablet Take 81 mg by mouth once.   Yes Historical Provider, MD  Cyanocobalamin (VITAMIN B-12 SL) Place 1 tablet under the tongue daily.   Yes Historical Provider, MD  EPINEPHrine (EPI-PEN) 0.3 mg/0.3 mL DEVI Inject 0.3 mg into the muscle as needed. For allergic reaction   Yes Historical Provider, MD  lisinopril (PRINIVIL,ZESTRIL) 10 MG tablet Take 10 mg by mouth daily.   Yes Historical Provider, MD  metFORMIN (GLUCOPHAGE) 1000 MG tablet Take 500-1,000 mg by mouth See admin instructions. Take 1 tablet in the morning and 0.5 tablet in the evening.   Yes Historical Provider, MD  thyroid (ARMOUR) 60 MG tablet Take 120 mg by mouth every morning.    Yes Historical Provider, MD   BP 139/60 mmHg  Pulse 73  Temp(Src) 97.9 F (36.6 C) (Oral)  Resp 24  Ht  (1.676 m)  Wt 200 lb (90.719 kg)  BMI 32.30 kg/m2  SpO2 94% Physical Exam  Constitutional: She is oriented to person, place, and time. She appears well-developed and well-nourished. No distress.  Patient is tearful and obviously anxious.  HENT:  Head: Normocephalic and atraumatic.  Mouth/Throat: Oropharynx is clear  and moist.  Eyes: EOM are normal. Pupils are equal, round, and reactive to light.  Neck: Normal range of motion. Neck supple.  No meningismus  Cardiovascular: Normal rate and regular rhythm.  Exam reveals no gallop and no friction rub.   No murmur heard. Pulmonary/Chest: Effort normal and breath sounds normal. No respiratory distress. She has no wheezes. She has no rales. She exhibits no tenderness.  Abdominal: Soft. Bowel sounds are normal. She exhibits no distension and no  mass. There is tenderness (patient has very mild epigastric tenderness with palpation.). There is no rebound and no guarding.  Musculoskeletal: Normal range of motion. She exhibits tenderness. She exhibits no edema.  Thoracic back tenderness is reproduced with palpation of the lower border of the left scapula. There is no obvious trauma. No swelling or deformity.  Neurological: She is alert and oriented to person, place, and time.  Patient is alert and oriented x3 with clear, goal oriented speech. Patient has 5/5 motor in all extremities. Sensation is intact to light touch. Bilateral finger-to-nose is normal with no signs of dysmetria. Patient has a normal gait and walks without assistance.  Skin: Skin is warm and dry. No rash noted. No erythema.  Psychiatric: Her behavior is normal.  Nursing note and vitals reviewed.   ED Course  Procedures (including critical care time) Labs Review Labs Reviewed  BASIC METABOLIC PANEL - Abnormal; Notable for the following:    Glucose, Bld 127 (*)    GFR calc non Af Amer 63 (*)    GFR calc Af Amer 72 (*)    All other components within normal limits  LIPASE, BLOOD - Abnormal; Notable for the following:    Lipase 75 (*)    All other components within normal limits  CBC  BRAIN NATRIURETIC PEPTIDE  I-STAT TROPOININ, ED  I-STAT TROPOININ, ED    Imaging Review Dg Chest 2 View  03/21/2014   CLINICAL DATA:  Acute onset of stabbing chest pain, radiating into the left jaw. Weakness and dizziness. Left arm numbness. Shakiness and nausea. Initial encounter.  EXAM: CHEST  2 VIEW  COMPARISON:  Chest radiograph, and CTA of the chest, performed 08/30/2011  FINDINGS: The lungs are well-aerated and clear. There is no evidence of focal opacification, pleural effusion or pneumothorax.  The heart is normal in size; the mediastinal contour is within normal limits. No acute osseous abnormalities are seen. Lateral osteophytes are seen along the lower thoracic spine.   IMPRESSION: No acute cardiopulmonary process seen.   Electronically Signed   By: Roanna RaiderJeffery  Chang M.D.   On: 03/21/2014 03:57     EKG Interpretation   Date/Time:  Sunday March 21 2014 02:46:15 EST Ventricular Rate:  90 PR Interval:  140 QRS Duration: 86 QT Interval:  364 QTC Calculation: 445 R Axis:   -36 Text Interpretation:  Normal sinus rhythm Left axis deviation Moderate  voltage criteria for LVH, may be normal variant Abnormal ECG Confirmed by  Ranae PalmsYELVERTON  MD, Emry Barbato ([id number]) on 03/21/2014 3:34:39 AM Also confirmed by  Ranae PalmsYELVERTON  MD, Davey Limas ([id number])  on 03/21/2014 4:05:32 AM      MDM   Final diagnoses:  Chest pain    Patient presents with multiple symptoms that are now mostly resolved. It seems to be exacerbated by stress and anxiety. Her chest pain lasted less than 1 minute was described as stabbing. She's had no more since. Initial EKG and troponin are normal. Will repeat a 3 hour troponin and if negative a believe that  acute cardiac injury can safely be ruled out. This would be very atypical for coronary artery disease. Patient's back pain is completely reproduced with palpation. This is consistent with muscular skeletal cause for the pain. We'll treat with muscle relaxant. Patient with very mild epigastric tenderness. She also has mild elevation in her lipase. This is of uncertain significance. She has had a cholecystectomy in the past. This will need to be followed by her primary doctor. I have low suspicion for PE in this patient. Her saturations are high 90s with a normal pulse. She's had no recent extended travel or surgeries. No lower extremity swelling or pain.   patient's symptoms have improved. Repeat trop is normal. Pt states she had similar symptoms after [employer] shooting. Will d./c home to f/u with PMD. Also have encouraged pt to see therapist as outpt. Informed of mildy elevated lipase and need to f/u. Return precautions given.   Loren Racer, MD 03/21/14 [phone]

## 2014-03-28 ENCOUNTER — Emergency Department (HOSPITAL_COMMUNITY): Payer: BLUE CROSS/BLUE SHIELD

## 2014-03-28 ENCOUNTER — Emergency Department (HOSPITAL_COMMUNITY)
Admission: EM | Admit: 2014-03-28 | Discharge: 2014-03-28 | Disposition: A | Discharge status: Home / Self Care | Payer: BLUE CROSS/BLUE SHIELD | Attending: Emergency Medicine | Admitting: Emergency Medicine

## 2014-03-28 ENCOUNTER — Encounter (HOSPITAL_COMMUNITY): Payer: Self-pay | Admitting: *Deleted

## 2014-03-28 DIAGNOSIS — F410 Panic disorder [episodic paroxysmal anxiety] without agoraphobia: Secondary | ICD-10-CM | POA: Diagnosis not present

## 2014-03-28 DIAGNOSIS — H938X3 Other specified disorders of ear, bilateral: Secondary | ICD-10-CM | POA: Insufficient documentation

## 2014-03-28 DIAGNOSIS — Z880 Allergy status to penicillin: Secondary | ICD-10-CM | POA: Diagnosis not present

## 2014-03-28 DIAGNOSIS — R05 Cough: Secondary | ICD-10-CM

## 2014-03-28 DIAGNOSIS — E079 Disorder of thyroid, unspecified: Secondary | ICD-10-CM | POA: Diagnosis not present

## 2014-03-28 DIAGNOSIS — M549 Dorsalgia, unspecified: Secondary | ICD-10-CM | POA: Insufficient documentation

## 2014-03-28 DIAGNOSIS — Z7982 Long term (current) use of aspirin: Secondary | ICD-10-CM | POA: Diagnosis not present

## 2014-03-28 DIAGNOSIS — J209 Acute bronchitis, unspecified: Secondary | ICD-10-CM

## 2014-03-28 DIAGNOSIS — Z8739 Personal history of other diseases of the musculoskeletal system and connective tissue: Secondary | ICD-10-CM | POA: Diagnosis not present

## 2014-03-28 DIAGNOSIS — Z79899 Other long term (current) drug therapy: Secondary | ICD-10-CM | POA: Insufficient documentation

## 2014-03-28 DIAGNOSIS — R059 Cough, unspecified: Secondary | ICD-10-CM

## 2014-03-28 LAB — CBG MONITORING, ED: Glucose-Capillary: 119 mg/dL — ABNORMAL HIGH (ref 70–99)

## 2014-03-28 MED ORDER — ACETAMINOPHEN 325 MG PO TABS
650.0000 mg | ORAL_TABLET | Freq: Once | ORAL | Status: AC
  Administered 2014-03-28: 650 mg via ORAL
  Filled 2014-03-28: qty 2

## 2014-03-28 MED ORDER — AEROCHAMBER PLUS W/MASK MISC
1.0000 | Freq: Once | Status: AC
  Administered 2014-03-28: 1
  Filled 2014-03-28: qty 1

## 2014-03-28 MED ORDER — ALBUTEROL SULFATE HFA 108 (90 BASE) MCG/ACT IN AERS
2.0000 | INHALATION_SPRAY | RESPIRATORY_TRACT | Status: DC | PRN
  Administered 2014-03-28: 2 via RESPIRATORY_TRACT
  Filled 2014-03-28: qty 6.7

## 2014-03-28 NOTE — ED Notes (Signed)
She is stressed.  She drove herself here

## 2014-03-28 NOTE — ED Notes (Signed)
The pt was here  Jan 14th and diagnosed with a pulled muscle beneath her lt scapula.  The pain conntinues and she thinks she may have had a temp none now.  She has a productive cough   She cannot sleep due to the coughing.  No labs or xrays at this time recent work-up  .  edp will see

## 2014-03-28 NOTE — ED Provider Notes (Addendum)
CSN: [id number]     Arrival date & time 03/28/14  1630 History   First MD Initiated Contact with Patient 03/28/14 1943     Chief Complaint  Patient presents with  . Cough     (Consider location/radiation/quality/duration/timing/severity/associated sxs/prior Treatment) HPI Complains pain at left parathoracic area onset approximately a week ago. Patient seen in the emergency department for same complaint every 14 2016. Pain is worse  deep inspiration or changing positions. She was treated with Robaxin with out relief. She presents today as she has developed a cough for duct of of green sputum onset yesterday. Maximum temperature approximate 99. No vomiting. No other associated symptoms. Past Medical History  Diagnosis Date  . Thyroid disease   . Panic attacks   . Gout    fibromyalgia Past Surgical History  Procedure Laterality Date  . Abdominal hysterectomy    . Tonsillectomy    . Cholecystectomy     No family history on file. History  Substance Use Topics  . Smoking status: Never Smoker   . Smokeless tobacco: Not on file  . Alcohol Use: No   OB History    No data available     Review of Systems  Constitutional: Negative.   HENT: Negative.        Ears feel clogged  Respiratory: Positive for cough.   Cardiovascular: Negative.   Gastrointestinal: Negative.   Musculoskeletal: Positive for back pain.  Skin: Negative.   Neurological: Negative.   Psychiatric/Behavioral: Negative.   All other systems reviewed and are negative.     Allergies  Bee venom; Penicillins; Sulfa antibiotics; Hydrocodone; and Ivp dye  Home Medications   Prior to Admission medications   Medication Sig Start Date End Date Taking? Authorizing Provider  ALPRAZolam Prudy Feeler) 1 MG tablet Take 1 mg by mouth 2 (two) times daily.   Yes Historical Provider, MD  aspirin EC 81 MG tablet Take 81 mg by mouth daily.    Yes Historical Provider, MD  Cyanocobalamin (VITAMIN B-12 SL) Place 1 tablet under the  tongue daily.   Yes Historical Provider, MD  EPINEPHrine (EPI-PEN) 0.3 mg/0.3 mL DEVI Inject 0.3 mg into the muscle as needed. For allergic reaction   Yes Historical Provider, MD  lisinopril (PRINIVIL,ZESTRIL) 10 MG tablet Take 10 mg by mouth daily.   Yes Historical Provider, MD  metFORMIN (GLUCOPHAGE) 1000 MG tablet Take 500-1,000 mg by mouth See admin instructions. Take 1000 mg in the morning and 500 mgt in the evening.   Yes Historical Provider, MD  methocarbamol (ROBAXIN) 500 MG tablet Take 2 tablets (1,000 mg total) by mouth every 8 (eight) hours as needed for muscle spasms. 03/21/14  Yes Loren Racer, MD  thyroid (ARMOUR) 60 MG tablet Take 120 mg by mouth every morning.    Yes Historical Provider, MD   BP 134/78 mmHg  Pulse 90  Temp(Src) 98.4 F (36.9 C) (Oral)  Resp 20  SpO2 97% Physical Exam  Constitutional: She is oriented to person, place, and time. She appears well-developed and well-nourished.  HENT:  Head: Normocephalic and atraumatic.  Eyes: Conjunctivae are normal. Pupils are equal, round, and reactive to light.  Neck: Neck supple. No tracheal deviation present. No thyromegaly present.  Cardiovascular: Normal rate and regular rhythm.   No murmur heard. Pulmonary/Chest: Effort normal and breath sounds normal.  Abdominal: Soft. Bowel sounds are normal. She exhibits no distension. There is no tenderness.  Obese  Musculoskeletal: Normal range of motion. She exhibits no edema or tenderness.  Neurological:  She is alert and oriented to person, place, and time. No cranial nerve deficit. Coordination normal.  Gait normal  Skin: Skin is warm and dry. No rash noted.  Psychiatric: She has a normal mood and affect.  Nursing note and vitals reviewed.   ED Course  Procedures (including critical care time) Labs Review Labs Reviewed  CBG MONITORING, ED - Abnormal; Notable for the following:    Glucose-Capillary 119 (*)    All other components within normal limits    Imaging  Review No results found.   EKG Interpretation None     9:05 PM requesting Tylenol for headache. Ordered by me. Chest x-ray viewed by me. Results for orders placed or performed during the hospital encounter of 03/28/14  CBG monitoring, ED  Result Value Ref Range   Glucose-Capillary 119 (H) 70 - 99 mg/dL   Dg Chest 2 View  1/61/09602/21/2016   CLINICAL DATA:  Sharp left-sided chest pain for 2 weeks.  Cough.  EXAM: CHEST  2 VIEW  COMPARISON:  03/21/2014  FINDINGS: Normal heart size and pulmonary vascularity. No focal airspace disease or consolidation in the lungs. No blunting of costophrenic angles. No pneumothorax. Mediastinal contours appear intact. Calcified granuloma in the left mid lung superior degenerative changes in the spine.  IMPRESSION: No active cardiopulmonary disease.   Electronically Signed   By: Burman NievesWilliam  Stevens M.D.   On: 03/28/2014 20:44   Dg Chest 2 View  03/21/2014   CLINICAL DATA:  Acute onset of stabbing chest pain, radiating into the left jaw. Weakness and dizziness. Left arm numbness. Shakiness and nausea. Initial encounter.  EXAM: CHEST  2 VIEW  COMPARISON:  Chest radiograph, and CTA of the chest, performed 08/30/2011  FINDINGS: The lungs are well-aerated and clear. There is no evidence of focal opacification, pleural effusion or pneumothorax.  The heart is normal in size; the mediastinal contour is within normal limits. No acute osseous abnormalities are seen. Lateral osteophytes are seen along the lower thoracic spine.  IMPRESSION: No acute cardiopulmonary process seen.   Electronically Signed   By: Roanna RaiderJeffery  Chang M.D.   On: 03/21/2014 03:57    MDM  An albuterol HFA with spacer to go to use 2 puffs every 4 hours when necessary shortness of breath or cough. Back pain is felt to be muscular in etiology Follow-up with her primary care physician is not better week Diagnosis #1 acute bronchitis #2 back pain Final diagnoses:  None        Doug SouSam Brennley Curtice, MD 03/28/14  2114  Doug SouSam Chrystel Barefield, MD 03/28/14 2115

## 2014-03-28 NOTE — Discharge Instructions (Signed)
Acute Bronchitis Use your inhaler 2 puffs every 4 hours as needed for cough or shortness of breath. Take Tylenol as needed for aches. See your primary care physician if not improving in a week Bronchitis is when the airways that extend from the windpipe into the lungs get red, puffy, and painful (inflamed). Bronchitis often causes thick spit (mucus) to develop. This leads to a cough. A cough is the most common symptom of bronchitis. In acute bronchitis, the condition usually begins suddenly and goes away over time (usually in 2 weeks). Smoking, allergies, and asthma can make bronchitis worse. Repeated episodes of bronchitis may cause more lung problems. HOME CARE  Rest.  Drink enough fluids to keep your pee (urine) clear or pale yellow (unless you need to limit fluids as told by your doctor).  Only take over-the-counter or prescription medicines as told by your doctor.  Avoid smoking and secondhand smoke. These can make bronchitis worse. If you are a smoker, think about using nicotine gum or skin patches. Quitting smoking will help your lungs heal faster.  Reduce the chance of getting bronchitis again by:  Washing your hands often.  Avoiding people with cold symptoms.  Trying not to touch your hands to your mouth, nose, or eyes.  Follow up with your doctor as told. GET HELP IF: Your symptoms do not improve after 1 week of treatment. Symptoms include:  Cough.  Fever.  Coughing up thick spit.  Body aches.  Chest congestion.  Chills.  Shortness of breath.  Sore throat. GET HELP RIGHT AWAY IF:   You have an increased fever.  You have chills.  You have severe shortness of breath.  You have bloody thick spit (sputum).  You throw up (vomit) often.  You lose too much body fluid (dehydration).  You have a severe headache.  You faint. MAKE SURE YOU:   Understand these instructions.  Will watch your condition.  Will get help right away if you are not doing well or  get worse. Document Released: 07/11/2007 Document Revised: 09/24/2012 Document Reviewed: 07/15/2012 [facility] Patient Information 2015 [employer], [place]. This information is not intended to replace advice given to you by your health care provider. Make sure you discuss any questions you have with your health care provider.

## 2014-06-22 DIAGNOSIS — F909 Attention-deficit hyperactivity disorder, unspecified type: Secondary | ICD-10-CM | POA: Insufficient documentation

## 2014-06-24 DIAGNOSIS — M48061 Spinal stenosis, lumbar region without neurogenic claudication: Secondary | ICD-10-CM | POA: Insufficient documentation

## 2014-07-12 DIAGNOSIS — I824Z2 Acute embolism and thrombosis of unspecified deep veins of left distal lower extremity: Secondary | ICD-10-CM | POA: Insufficient documentation

## 2014-07-12 DIAGNOSIS — E119 Type 2 diabetes mellitus without complications: Secondary | ICD-10-CM | POA: Insufficient documentation

## 2014-07-12 DIAGNOSIS — G459 Transient cerebral ischemic attack, unspecified: Secondary | ICD-10-CM | POA: Insufficient documentation

## 2014-07-26 ENCOUNTER — Emergency Department (HOSPITAL_COMMUNITY)
Admission: EM | Admit: 2014-07-26 | Discharge: 2014-07-26 | Disposition: A | Discharge status: Home / Self Care | Payer: BLUE CROSS/BLUE SHIELD | Attending: Emergency Medicine | Admitting: Emergency Medicine

## 2014-07-26 ENCOUNTER — Emergency Department (HOSPITAL_COMMUNITY): Payer: BLUE CROSS/BLUE SHIELD

## 2014-07-26 ENCOUNTER — Encounter (HOSPITAL_COMMUNITY): Payer: Self-pay | Admitting: *Deleted

## 2014-07-26 DIAGNOSIS — Z86718 Personal history of other venous thrombosis and embolism: Secondary | ICD-10-CM | POA: Diagnosis not present

## 2014-07-26 DIAGNOSIS — Z8739 Personal history of other diseases of the musculoskeletal system and connective tissue: Secondary | ICD-10-CM | POA: Diagnosis not present

## 2014-07-26 DIAGNOSIS — F1310 Sedative, hypnotic or anxiolytic abuse, uncomplicated: Secondary | ICD-10-CM | POA: Diagnosis not present

## 2014-07-26 DIAGNOSIS — Z7982 Long term (current) use of aspirin: Secondary | ICD-10-CM | POA: Diagnosis not present

## 2014-07-26 DIAGNOSIS — Z7901 Long term (current) use of anticoagulants: Secondary | ICD-10-CM | POA: Insufficient documentation

## 2014-07-26 DIAGNOSIS — F410 Panic disorder [episodic paroxysmal anxiety] without agoraphobia: Secondary | ICD-10-CM | POA: Diagnosis not present

## 2014-07-26 DIAGNOSIS — Z880 Allergy status to penicillin: Secondary | ICD-10-CM | POA: Insufficient documentation

## 2014-07-26 DIAGNOSIS — R200 Anesthesia of skin: Secondary | ICD-10-CM

## 2014-07-26 DIAGNOSIS — E079 Disorder of thyroid, unspecified: Secondary | ICD-10-CM | POA: Diagnosis not present

## 2014-07-26 DIAGNOSIS — Z79899 Other long term (current) drug therapy: Secondary | ICD-10-CM | POA: Diagnosis not present

## 2014-07-26 LAB — URINALYSIS, ROUTINE W REFLEX MICROSCOPIC
BILIRUBIN URINE: NEGATIVE
GLUCOSE, UA: NEGATIVE mg/dL
Hgb urine dipstick: NEGATIVE
Ketones, ur: NEGATIVE mg/dL
Nitrite: NEGATIVE
Protein, ur: NEGATIVE mg/dL
SPECIFIC GRAVITY, URINE: 1.004 — AB (ref 1.005–1.030)
Urobilinogen, UA: 0.2 mg/dL (ref 0.0–1.0)
pH: 6.5 (ref 5.0–8.0)

## 2014-07-26 LAB — RAPID URINE DRUG SCREEN, HOSP PERFORMED
AMPHETAMINES: NOT DETECTED
BENZODIAZEPINES: POSITIVE — AB
Barbiturates: NOT DETECTED
Cocaine: NOT DETECTED
Opiates: NOT DETECTED
Tetrahydrocannabinol: NOT DETECTED

## 2014-07-26 LAB — CBC WITH DIFFERENTIAL/PLATELET
BASOS PCT: 1 % (ref 0–1)
Basophils Absolute: 0.1 10*3/uL (ref 0.0–0.1)
EOS PCT: 12 % — AB (ref 0–5)
Eosinophils Absolute: 0.9 10*3/uL — ABNORMAL HIGH (ref 0.0–0.7)
HCT: 33.7 % — ABNORMAL LOW (ref 36.0–46.0)
HEMOGLOBIN: 11.5 g/dL — AB (ref 12.0–15.0)
LYMPHS ABS: 3.1 10*3/uL (ref 0.7–4.0)
Lymphocytes Relative: 41 % (ref 12–46)
MCH: 33.1 pg (ref 26.0–34.0)
MCHC: 34.1 g/dL (ref 30.0–36.0)
MCV: 97.1 fL (ref 78.0–100.0)
MONOS PCT: 7 % (ref 3–12)
Monocytes Absolute: 0.5 10*3/uL (ref 0.1–1.0)
NEUTROS PCT: 39 % — AB (ref 43–77)
Neutro Abs: 3 10*3/uL (ref 1.7–7.7)
Platelets: 233 10*3/uL (ref 150–400)
RBC: 3.47 MIL/uL — ABNORMAL LOW (ref 3.87–5.11)
RDW: 12.3 % (ref 11.5–15.5)
WBC: 7.6 10*3/uL (ref 4.0–10.5)

## 2014-07-26 LAB — COMPREHENSIVE METABOLIC PANEL
ALT: 17 U/L (ref 14–54)
ANION GAP: 8 (ref 5–15)
AST: 25 U/L (ref 15–41)
Albumin: 3.7 g/dL (ref 3.5–5.0)
Alkaline Phosphatase: 57 U/L (ref 38–126)
BUN: 14 mg/dL (ref 6–20)
CO2: 26 mmol/L (ref 22–32)
Calcium: 9.6 mg/dL (ref 8.9–10.3)
Chloride: 103 mmol/L (ref 101–111)
Creatinine, Ser: 0.76 mg/dL (ref 0.44–1.00)
GFR calc non Af Amer: >60 mL/min (ref >60)
GLUCOSE: 99 mg/dL (ref 65–99)
Potassium: 3.9 mmol/L (ref 3.5–5.1)
SODIUM: 137 mmol/L (ref 135–145)
TOTAL PROTEIN: 7.2 g/dL (ref 6.5–8.1)
Total Bilirubin: 0.7 mg/dL (ref 0.3–1.2)

## 2014-07-26 LAB — CBG MONITORING, ED: Glucose-Capillary: 98 mg/dL (ref 65–99)

## 2014-07-26 LAB — ETHANOL

## 2014-07-26 LAB — URINE MICROSCOPIC-ADD ON

## 2014-07-26 NOTE — ED Notes (Signed)
Pt states that she had spinal surgery and had a clot in her left leg (dc'd 07/15/14), states that she takes xarelto. States that she has been experiencing rt sided numbness off and on for 1 week. States that the current numbness started at 8p.

## 2014-07-26 NOTE — Discharge Instructions (Signed)

## 2014-07-26 NOTE — ED Provider Notes (Signed)
CSN: [id number]     Arrival date & time 07/26/14  0215 History   First MD Initiated Contact with Patient 07/26/14 0234     Chief Complaint  Patient presents with  . Numbness     (Consider location/radiation/quality/duration/timing/severity/associated sxs/prior Treatment) Patient is a 64 y.o. female presenting with neurologic complaint.  Neurologic Problem This is a recurrent problem. The current episode started yesterday. The problem occurs constantly. The problem has been gradually improving. Pertinent negatives include no chest pain, no abdominal pain, no headaches and no shortness of breath. Nothing aggravates the symptoms. Nothing relieves the symptoms. She has tried nothing for the symptoms.    Past Medical History  Diagnosis Date  . Thyroid disease   . Panic attacks   . Gout    Past Surgical History  Procedure Laterality Date  . Abdominal hysterectomy    . Tonsillectomy    . Cholecystectomy     No family history on file. History  Substance Use Topics  . Smoking status: Never Smoker   . Smokeless tobacco: Not on file  . Alcohol Use: No   OB History    No data available     Review of Systems  Respiratory: Negative for shortness of breath.   Cardiovascular: Negative for chest pain.  Gastrointestinal: Negative for abdominal pain.  Neurological: Negative for headaches.  All other systems reviewed and are negative.     Allergies  Bee venom; Penicillins; Sulfa antibiotics; Hydrocodone; and Ivp dye  Home Medications   Prior to Admission medications   Medication Sig Start Date End Date Taking? Authorizing Provider  ALPRAZolam Prudy Feeler) 1 MG tablet Take 1 mg by mouth 2 (two) times daily.   Yes Historical Provider, MD  aspirin EC 81 MG tablet Take 81 mg by mouth daily.    Yes Historical Provider, MD  Cyanocobalamin (VITAMIN B-12 SL) Place 1 tablet under the tongue daily.   Yes Historical Provider, MD  cyclobenzaprine (FLEXERIL) 10 MG tablet Take 10 mg by mouth at  bedtime as needed for muscle spasms.   Yes Historical Provider, MD  lisinopril (PRINIVIL,ZESTRIL) 10 MG tablet Take 10 mg by mouth daily.   Yes Historical Provider, MD  metFORMIN (GLUCOPHAGE) 1000 MG tablet Take 500-1,000 mg by mouth See admin instructions. Take 1000 mg in the morning and 500 mgt in the evening.   Yes Historical Provider, MD  methocarbamol (ROBAXIN) 500 MG tablet Take 2 tablets (1,000 mg total) by mouth every 8 (eight) hours as needed for muscle spasms. 03/21/14  Yes Loren Racer, MD  Rivaroxaban (XARELTO) 15 MG TABS tablet Take 15 mg by mouth 2 (two) times daily with a meal.   Yes Historical Provider, MD  thyroid (ARMOUR) 60 MG tablet Take 120 mg by mouth every morning.    Yes Historical Provider, MD  EPINEPHrine (EPI-PEN) 0.3 mg/0.3 mL DEVI Inject 0.3 mg into the muscle as needed. For allergic reaction    Historical Provider, MD   BP 142/73 mmHg  Pulse 61  Temp(Src) 97.5 F (36.4 C) (Oral)  Resp 21  SpO2 98% Physical Exam  Constitutional: She is oriented to person, place, and time. She appears well-developed and well-nourished.  HENT:  Head: Normocephalic and atraumatic.  Right Ear: External ear normal.  Left Ear: External ear normal.  Eyes: Conjunctivae and EOM are normal. Pupils are equal, round, and reactive to light.  Neck: Normal range of motion. Neck supple.  Cardiovascular: Normal rate, regular rhythm, normal heart sounds and intact distal pulses.   Pulmonary/Chest:  Effort normal and breath sounds normal.  Abdominal: Soft. Bowel sounds are normal. There is no tenderness.  Musculoskeletal: Normal range of motion.  Neurological: She is alert and oriented to person, place, and time. She has normal strength and normal reflexes. A sensory deficit (subj decrease in R side, including forehead) is present. Gait normal.  Skin: Skin is warm and dry.  Vitals reviewed.   ED Course  Procedures (including critical care time) Labs Review Labs Reviewed  CBC WITH  DIFFERENTIAL/PLATELET - Abnormal; Notable for the following:    RBC 3.47 (*)    Hemoglobin 11.5 (*)    HCT 33.7 (*)    Neutrophils Relative % 39 (*)    Eosinophils Relative 12 (*)    Eosinophils Absolute 0.9 (*)    All other components within normal limits  URINE RAPID DRUG SCREEN, HOSP PERFORMED - Abnormal; Notable for the following:    Benzodiazepines POSITIVE (*)    All other components within normal limits  URINALYSIS, ROUTINE W REFLEX MICROSCOPIC (NOT AT [facility]) - Abnormal; Notable for the following:    Specific Gravity, Urine 1.004 (*)    Leukocytes, UA SMALL (*)    All other components within normal limits  COMPREHENSIVE METABOLIC PANEL  ETHANOL  URINE MICROSCOPIC-ADD ON  CBG MONITORING, ED    Imaging Review No results found.   EKG Interpretation None      MDM   Final diagnoses:  Numbness    64 y.o. 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 On arrival the patient has vital signs and physical exam as above. She has no objective neurologic deficit, however has subjective sensory decrease of the entire right side including the forehead.  Her other primary complaint is shaking of the left side of her body, this appears volitional.  Workup as above unremarkable, feel her symptoms likely anxiety mediated versus routine neurologic recovery from spinal procedure, which the patient had prior to onset of symptoms.  Do not feel that this demonstrates an acute radiculopathy or other emergent pathology. Discharged home in stable condition.  I have reviewed all laboratory and imaging studies if ordered as above  1. Numbness         Mirian Mo, MD 07/29/14 [phone]

## 2015-02-06 DIAGNOSIS — I82409 Acute embolism and thrombosis of unspecified deep veins of unspecified lower extremity: Secondary | ICD-10-CM

## 2015-02-06 HISTORY — DX: Acute embolism and thrombosis of unspecified deep veins of unspecified lower extremity: I82.409

## 2016-08-27 ENCOUNTER — Encounter (HOSPITAL_COMMUNITY): Payer: Self-pay | Admitting: *Deleted

## 2016-08-27 ENCOUNTER — Inpatient Hospital Stay (HOSPITAL_COMMUNITY)
Admission: RE | Admit: 2016-08-27 | Discharge: 2016-09-04 | DRG: 885 | Disposition: A | Discharge status: Home / Self Care | Payer: Federal, State, Local not specified - Other | Attending: Psychiatry | Admitting: Psychiatry

## 2016-08-27 DIAGNOSIS — R45851 Suicidal ideations: Secondary | ICD-10-CM | POA: Diagnosis not present

## 2016-08-27 DIAGNOSIS — F325 Major depressive disorder, single episode, in full remission: Secondary | ICD-10-CM | POA: Diagnosis not present

## 2016-08-27 DIAGNOSIS — Z7982 Long term (current) use of aspirin: Secondary | ICD-10-CM | POA: Diagnosis not present

## 2016-08-27 DIAGNOSIS — Z7984 Long term (current) use of oral hypoglycemic drugs: Secondary | ICD-10-CM | POA: Diagnosis not present

## 2016-08-27 DIAGNOSIS — E039 Hypothyroidism, unspecified: Secondary | ICD-10-CM | POA: Diagnosis present

## 2016-08-27 DIAGNOSIS — I10 Essential (primary) hypertension: Secondary | ICD-10-CM | POA: Diagnosis present

## 2016-08-27 DIAGNOSIS — Z79899 Other long term (current) drug therapy: Secondary | ICD-10-CM | POA: Diagnosis not present

## 2016-08-27 DIAGNOSIS — F13239 Sedative, hypnotic or anxiolytic dependence with withdrawal, unspecified: Secondary | ICD-10-CM | POA: Diagnosis not present

## 2016-08-27 DIAGNOSIS — F19939 Other psychoactive substance use, unspecified with withdrawal, unspecified: Secondary | ICD-10-CM | POA: Diagnosis not present

## 2016-08-27 DIAGNOSIS — F329 Major depressive disorder, single episode, unspecified: Secondary | ICD-10-CM | POA: Diagnosis present

## 2016-08-27 DIAGNOSIS — G4700 Insomnia, unspecified: Secondary | ICD-10-CM | POA: Diagnosis not present

## 2016-08-27 DIAGNOSIS — F333 Major depressive disorder, recurrent, severe with psychotic symptoms: Principal | ICD-10-CM | POA: Diagnosis present

## 2016-08-27 DIAGNOSIS — F419 Anxiety disorder, unspecified: Secondary | ICD-10-CM | POA: Diagnosis not present

## 2016-08-27 DIAGNOSIS — F19239 Other psychoactive substance dependence with withdrawal, unspecified: Secondary | ICD-10-CM

## 2016-08-27 LAB — GLUCOSE, CAPILLARY
Glucose-Capillary: 115 mg/dL — ABNORMAL HIGH (ref 65–99)
Glucose-Capillary: 106 mg/dL — ABNORMAL HIGH (ref 65–99)
Glucose-Capillary: 104 mg/dL — ABNORMAL HIGH (ref 65–99)

## 2016-08-27 MED ORDER — THYROID 30 MG PO TABS
90.0000 mg | ORAL_TABLET | Freq: Every morning | ORAL | Status: DC
  Administered 2016-08-28 – 2016-09-04 (×8): 90 mg via ORAL
  Filled 2016-08-27 (×11): qty 3

## 2016-08-27 MED ORDER — ACETAMINOPHEN 325 MG PO TABS
650.0000 mg | ORAL_TABLET | Freq: Four times a day (QID) | ORAL | Status: DC | PRN
  Administered 2016-08-28 – 2016-09-02 (×6): 650 mg via ORAL
  Filled 2016-08-27 (×6): qty 2

## 2016-08-27 MED ORDER — ALUM & MAG HYDROXIDE-SIMETH 200-200-20 MG/5ML PO SUSP: 30.0000 mL | ORAL | Status: DC | PRN

## 2016-08-27 MED ORDER — MAGNESIUM HYDROXIDE 400 MG/5ML PO SUSP: 30.0000 mL | Freq: Every day | ORAL | Status: DC | PRN

## 2016-08-27 MED ORDER — HYDROXYZINE HCL 25 MG PO TABS
25.0000 mg | ORAL_TABLET | Freq: Three times a day (TID) | ORAL | Status: DC | PRN
  Administered 2016-08-27 – 2016-08-28 (×2): 25 mg via ORAL
  Filled 2016-08-27 (×2): qty 1

## 2016-08-27 MED ORDER — METFORMIN HCL 500 MG PO TABS
1000.0000 mg | ORAL_TABLET | Freq: Two times a day (BID) | ORAL | Status: DC
  Administered 2016-08-27 – 2016-08-31 (×3): 1000 mg via ORAL
  Filled 2016-08-27 (×13): qty 2

## 2016-08-27 MED ORDER — LORAZEPAM 0.5 MG PO TABS
0.5000 mg | ORAL_TABLET | Freq: Four times a day (QID) | ORAL | Status: DC | PRN
  Administered 2016-08-27 – 2016-08-28 (×3): 0.5 mg via ORAL
  Filled 2016-08-27 (×5): qty 1

## 2016-08-27 MED ORDER — ASPIRIN EC 81 MG PO TBEC
81.0000 mg | DELAYED_RELEASE_TABLET | Freq: Every day | ORAL | Status: DC
  Administered 2016-08-27 – 2016-09-04 (×9): 81 mg via ORAL
  Filled 2016-08-27 (×11): qty 1

## 2016-08-27 MED ORDER — TRAZODONE HCL 50 MG PO TABS
50.0000 mg | ORAL_TABLET | Freq: Every evening | ORAL | Status: DC | PRN
  Administered 2016-08-27 – 2016-08-28 (×2): 50 mg via ORAL
  Filled 2016-08-27: qty 1

## 2016-08-27 MED ORDER — LISINOPRIL 20 MG PO TABS
20.0000 mg | ORAL_TABLET | Freq: Every day | ORAL | Status: DC
  Administered 2016-08-28 – 2016-09-04 (×8): 20 mg via ORAL
  Filled 2016-08-27 (×10): qty 1

## 2016-08-27 MED ORDER — TRAZODONE HCL 50 MG PO TABS
50.0000 mg | ORAL_TABLET | Freq: Once | ORAL | Status: AC
  Administered 2016-08-27: 50 mg via ORAL
  Filled 2016-08-27: qty 1

## 2016-08-27 MED ORDER — EPINEPHRINE 0.3 MG/0.3ML IJ SOAJ: 0.3000 mg | INTRAMUSCULAR | Status: DC | PRN

## 2016-08-27 NOTE — H&P (Signed)
Behavioral Health Medical Screening Exam  Tiffany Cardenas is an 66 y.o. female who [facility] accompanied by her significant other with complaints of overwhelming anxiety. Patient denies any SI/HI/VAH.   Total Time spent with patient: 30 minutes  Psychiatric Specialty Exam: Physical Exam  Constitutional: She is oriented to person, place, and time. She appears well-developed and well-nourished.  HENT:  Head: Normocephalic.  Eyes: Pupils are equal, round, and reactive to light.  Neck: Normal range of motion. Neck supple.  Cardiovascular: Normal rate and regular rhythm.   Respiratory: Effort normal and breath sounds normal.  GI: Soft. Bowel sounds are normal.  Genitourinary:  Genitourinary Comments: Deferred  Musculoskeletal: Normal range of motion.  Neurological: She is alert and oriented to person, place, and time.  Skin: Skin is warm and dry.    Review of Systems  Psychiatric/Behavioral: Positive for depression. Negative for hallucinations, memory loss, substance abuse and suicidal ideas. The patient is nervous/anxious and has insomnia.   All other systems reviewed and are negative.   Blood pressure (!) 155/87, pulse 82, temperature 98.7 F (37.1 C), temperature source Oral, resp. rate 16, SpO2 99 %.There is no height or weight on file to calculate BMI.  General Appearance: Casual  Eye Contact:  Good  Speech:  Clear and Coherent and Normal Rate  Volume:  Normal  Mood:  Anxious and Depressed  Affect:  Congruent, Depressed and Tearful  Thought Process:  Coherent and Goal Directed  Orientation:  Full (Time, Place, and Person)  Thought Content:  WDL and Logical  Suicidal Thoughts:  No  Homicidal Thoughts:  No  Memory:  Immediate;   Good Recent;   Good Remote;   Fair  Judgement:  Intact  Insight:  Present  Psychomotor Activity:  Normal  Concentration: Concentration: Good and Attention Span: Good  Recall:  Good  Fund of Knowledge:Good  Language: Good   Akathisia:  Negative  Handed:  Right  AIMS (if indicated):     Assets:  Communication Skills Desire for Improvement Financial Resources/Insurance Housing Intimacy Leisure Time Social Support  Sleep:       Musculoskeletal: Strength & Muscle Tone: within normal limits Gait & Station: normal Patient leans: N/A  Blood pressure (!) 155/87, pulse 82, temperature 98.7 F (37.1 C), temperature source Oral, resp. rate 16, SpO2 99 %.  Recommendations:  [facility] with the recommendation for IP and was referred to here for IP hospitalization. Based on my evaluation the patient appears to have an emergency medical condition for which I recommend the patient be transferred to the emergency department for further evaluation.  Delila PereyraJustina A Jamesia Linnen, NP 08/27/2016, 10:29 AM

## 2016-08-27 NOTE — Progress Notes (Signed)
Patient noted to be anxious, tremulous, and tearful.  Patient has taken anitanxitety medications without any relief. Consulted with physician and new orders placed for anti anxiety medications.

## 2016-08-27 NOTE — Tx Team (Signed)
Initial Treatment Plan 08/27/2016 1:51 PM Tiffany Cardenas ZOX:[id number]    PATIENT STRESSORS: Medication change or noncompliance   PATIENT STRENGTHS: Average or above average intelligence Capable of independent living General fund of knowledge Motivation for treatment/growth Supportive family/friends Work skills   PATIENT IDENTIFIED PROBLEMS:   "To feel better."    "I want the panic attacks to go away."               DISCHARGE CRITERIA:  Improved stabilization in mood, thinking, and/or behavior Need for constant or close observation no longer present Verbal commitment to aftercare and medication compliance  PRELIMINARY DISCHARGE PLAN: Outpatient therapy Return to previous living arrangement Return to previous work or school arrangements  PATIENT/FAMILY INVOLVEMENT: This treatment plan has been presented to and reviewed with the patient, Tiffany Cardenas, and/or family member.  The patient and family have been given the opportunity to ask questions and make suggestions.  Lawrence MarseillesFriedman, Dimitrius Steedman Eakes, RN 08/27/2016, 1:51 PM

## 2016-08-27 NOTE — Plan of Care (Signed)
Problem: Safety: Goal: Periods of time without injury will increase Outcome: Not Applicable Date Met: 25/83/46 Patient is not suicidal, no hx of self harm.
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
## 2016-08-28 ENCOUNTER — Encounter (HOSPITAL_COMMUNITY): Payer: Self-pay | Admitting: Behavioral Health

## 2016-08-28 DIAGNOSIS — G4700 Insomnia, unspecified: Secondary | ICD-10-CM

## 2016-08-28 DIAGNOSIS — F333 Major depressive disorder, recurrent, severe with psychotic symptoms: Secondary | ICD-10-CM | POA: Insufficient documentation

## 2016-08-28 DIAGNOSIS — R45851 Suicidal ideations: Secondary | ICD-10-CM

## 2016-08-28 LAB — GLUCOSE, CAPILLARY
GLUCOSE-CAPILLARY: 105 mg/dL — AB (ref 65–99)
Glucose-Capillary: 99 mg/dL (ref 65–99)
Glucose-Capillary: 84 mg/dL (ref 65–99)

## 2016-08-28 MED ORDER — DULOXETINE HCL 30 MG PO CPEP
30.0000 mg | ORAL_CAPSULE | Freq: Every day | ORAL | Status: DC
  Administered 2016-08-28: 30 mg via ORAL
  Filled 2016-08-28 (×4): qty 1

## 2016-08-28 MED ORDER — ADULT MULTIVITAMIN W/MINERALS CH
1.0000 | ORAL_TABLET | Freq: Every day | ORAL | Status: DC
  Administered 2016-08-28 – 2016-09-04 (×8): 1 via ORAL
  Filled 2016-08-28 (×9): qty 1

## 2016-08-28 MED ORDER — HYDROXYZINE HCL 25 MG PO TABS
25.0000 mg | ORAL_TABLET | Freq: Four times a day (QID) | ORAL | Status: AC | PRN
  Administered 2016-08-28 – 2016-08-31 (×5): 25 mg via ORAL
  Filled 2016-08-28 (×5): qty 1

## 2016-08-28 MED ORDER — LORAZEPAM 1 MG PO TABS
1.0000 mg | ORAL_TABLET | Freq: Four times a day (QID) | ORAL | Status: AC
  Administered 2016-08-28 – 2016-08-29 (×3): 1 mg via ORAL
  Filled 2016-08-28 (×4): qty 1

## 2016-08-28 MED ORDER — PRAZOSIN HCL 1 MG PO CAPS
1.0000 mg | ORAL_CAPSULE | Freq: Every day | ORAL | Status: DC
  Administered 2016-08-28 – 2016-09-03 (×6): 1 mg via ORAL
  Filled 2016-08-28 (×8): qty 1

## 2016-08-28 MED ORDER — PRAZOSIN HCL 1 MG PO CAPS
ORAL_CAPSULE | ORAL | Status: AC
  Filled 2016-08-28: qty 1

## 2016-08-28 MED ORDER — LORAZEPAM 1 MG PO TABS
1.0000 mg | ORAL_TABLET | Freq: Four times a day (QID) | ORAL | Status: AC | PRN
  Administered 2016-08-28 – 2016-08-30 (×4): 1 mg via ORAL
  Filled 2016-08-28 (×4): qty 1

## 2016-08-28 MED ORDER — ONDANSETRON 4 MG PO TBDP: 4.0000 mg | ORAL_TABLET | Freq: Four times a day (QID) | ORAL | Status: AC | PRN

## 2016-08-28 MED ORDER — LORAZEPAM 1 MG PO TABS
1.0000 mg | ORAL_TABLET | Freq: Two times a day (BID) | ORAL | Status: AC
  Administered 2016-08-30 – 2016-08-31 (×2): 1 mg via ORAL
  Filled 2016-08-28: qty 1

## 2016-08-28 MED ORDER — THIAMINE HCL 100 MG/ML IJ SOLN
100.0000 mg | Freq: Once | INTRAMUSCULAR | Status: AC
  Administered 2016-08-28: 100 mg via INTRAMUSCULAR
  Filled 2016-08-28: qty 2

## 2016-08-28 MED ORDER — LORAZEPAM 1 MG PO TABS
1.0000 mg | ORAL_TABLET | Freq: Three times a day (TID) | ORAL | Status: AC
  Administered 2016-08-29 – 2016-08-30 (×3): 1 mg via ORAL
  Filled 2016-08-28 (×3): qty 1

## 2016-08-28 MED ORDER — VITAMIN B-1 100 MG PO TABS
100.0000 mg | ORAL_TABLET | Freq: Every day | ORAL | Status: DC
  Administered 2016-08-29 – 2016-09-04 (×7): 100 mg via ORAL
  Filled 2016-08-28 (×9): qty 1

## 2016-08-28 MED ORDER — LOPERAMIDE HCL 2 MG PO CAPS: 2.0000 mg | ORAL_CAPSULE | ORAL | Status: AC | PRN

## 2016-08-28 MED ORDER — LORAZEPAM 1 MG PO TABS
1.0000 mg | ORAL_TABLET | Freq: Every day | ORAL | Status: AC
  Administered 2016-08-31: 1 mg via ORAL
  Filled 2016-08-28 (×2): qty 1

## 2016-08-28 MED ORDER — TRAZODONE HCL 50 MG PO TABS
50.0000 mg | ORAL_TABLET | Freq: Every evening | ORAL | Status: DC | PRN
  Administered 2016-08-28 – 2016-09-03 (×10): 50 mg via ORAL
  Filled 2016-08-28 (×16): qty 1
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
## 2016-08-28 NOTE — Progress Notes (Signed)
At 2345, pt came to the NS stating that she was not able to get back to sleep after sleeping for a short time.  Writer spoke with the provider on call who gave pt repeat dose of Trazodone 50 mg.  Pt is resting in bed with eyes closed at this time.

## 2016-08-28 NOTE — Tx Team (Signed)
Interdisciplinary Treatment and Diagnostic Plan Update  08/28/2016 Time of Session: [place] MRN: [id number]  Principal Diagnosis: MDD, recurrent, severe  Secondary Diagnoses: Active Problems:   MDD (major depressive disorder)   Current Medications:  Current Facility-Administered Medications  Medication Dose Route Frequency Provider Last Rate Last Dose  . acetaminophen (TYLENOL) tablet 650 mg  650 mg Oral Q6H PRN Okonkwo, Justina A, NP      . alum & mag hydroxide-simeth (MAALOX/MYLANTA) 200-200-20 MG/5ML suspension 30 mL  30 mL Oral Q4H PRN Okonkwo, Justina A, NP      . aspirin EC tablet 81 mg  81 mg Oral Daily Okonkwo, Justina A, NP   81 mg at 08/28/16 0841  . EPINEPHrine (EPI-PEN) injection 0.3 mg  0.3 mg Intramuscular PRN Okonkwo, Justina A, NP      . hydrOXYzine (ATARAX/VISTARIL) tablet 25 mg  25 mg Oral TID PRN Lu Duffel, Justina A, NP   25 mg at 08/28/16 0929  . lisinopril (PRINIVIL,ZESTRIL) tablet 20 mg  20 mg Oral Daily Okonkwo, Justina A, NP   20 mg at 08/28/16 0841  . LORazepam (ATIVAN) tablet 0.5 mg  0.5 mg Oral Q6H PRN Cobos, Myer Peer, MD   0.5 mg at 08/28/16 0608  . magnesium hydroxide (MILK OF MAGNESIA) suspension 30 mL  30 mL Oral Daily PRN Okonkwo, Justina A, NP      . metFORMIN (GLUCOPHAGE) tablet 1,000 mg  1,000 mg Oral BID WC Okonkwo, Justina A, NP   1,000 mg at 08/28/16 0840  . thyroid (ARMOUR) tablet 90 mg  90 mg Oral q morning - 10a Okonkwo, Justina A, NP   90 mg at 08/28/16 0924  . traZODone (DESYREL) tablet 50 mg  50 mg Oral QHS PRN Lu Duffel, Justina A, NP   50 mg at 08/27/16 2116   PTA Medications: Prescriptions Prior to Admission  Medication Sig Dispense Refill Last Dose  . ALPRAZolam (XANAX) 1 MG tablet Take 1 mg by mouth 2 (two) times daily.   08/26/2016  . aspirin 81 MG chewable tablet Chew 81 mg by mouth daily.   08/27/2016  . Calcium Carb-Cholecalciferol (CALCIUM 600 + D PO) Take 1 tablet by mouth every morning.   08/26/2016  . Cyanocobalamin  (VITAMIN B-12 SL) Place 1 tablet under the tongue daily.   Past Week  . EPINEPHrine (EPI-PEN) 0.3 mg/0.3 mL DEVI Inject 0.3 mg into the muscle as needed (For allergic reaction).    years ago  . lisinopril (PRINIVIL,ZESTRIL) 20 MG tablet Take 20 mg by mouth every morning.   08/27/2016  . Lysine 500 MG CAPS Take 500 mg by mouth every morning.   08/25/2016  . metFORMIN (GLUCOPHAGE) 1000 MG tablet Take 500 mg by mouth See admin instructions. Takes half a tablet ('500mg'$ ) only if her blood sugar is greater than 140.   08/26/2016  . senna-docusate (SENOKOT-S) 8.6-50 MG tablet Take 2 tablets by mouth as needed for mild constipation.    Past Month  . thyroid (ARMOUR) 60 MG tablet Take 90 mg by mouth every morning.    08/27/2016    Patient Stressors: Medication change or noncompliance  Patient Strengths: Average or above average intelligence Capable of independent living General fund of knowledge Motivation for treatment/growth Supportive family/friends Work skills  Treatment Modalities: Medication Management, Group therapy, Case management,  1 to 1 session with clinician, Psychoeducation, Recreational therapy.   Physician Treatment Plan for Primary Diagnosis:MDD, recurrent, severe  Medication Management: Evaluate patient's response, side effects, and tolerance of medication  regimen.  Therapeutic Interventions: 1 to 1 sessions, Unit Group sessions and Medication administration.  Evaluation of Outcomes: Not Met  Physician Treatment Plan for Secondary Diagnosis: Active Problems:   MDD (major depressive disorder)  Long Term Goal(s):     Short Term Goals:       Medication Management: Evaluate patient's response, side effects, and tolerance of medication regimen.  Therapeutic Interventions: 1 to 1 sessions, Unit Group sessions and Medication administration.  Evaluation of Outcomes: Not Met   RN Treatment Plan for Primary Diagnosis: MDD, recurrent, severe Long Term Goal(s): Knowledge of  disease and therapeutic regimen to maintain health will improve  Short Term Goals: Ability to remain free from injury will improve, Ability to verbalize feelings will improve and Ability to disclose and discuss suicidal ideas  Medication Management: RN will administer medications as ordered by provider, will assess and evaluate patient's response and provide education to patient for prescribed medication. RN will report any adverse and/or side effects to prescribing provider.  Therapeutic Interventions: 1 on 1 counseling sessions, Psychoeducation, Medication administration, Evaluate responses to treatment, Monitor vital signs and CBGs as ordered, Perform/monitor CIWA, COWS, AIMS and Fall Risk screenings as ordered, Perform wound care treatments as ordered.  Evaluation of Outcomes: Not Met   LCSW Treatment Plan for Primary Diagnosis: MDD, recurrent, severe Long Term Goal(s): Safe transition to appropriate next level of care at discharge, Engage patient in therapeutic group addressing interpersonal concerns.  Short Term Goals: Engage patient in aftercare planning with referrals and resources, Facilitate patient progression through stages of change regarding substance use diagnoses and concerns and Identify triggers associated with mental health/substance abuse issues  Therapeutic Interventions: Assess for all discharge needs, 1 to 1 time with Social worker, Explore available resources and support systems, Assess for adequacy in community support network, Educate family and significant other(s) on suicide prevention, Complete Psychosocial Assessment, Interpersonal group therapy.  Evaluation of Outcomes: Not Met   Progress in Treatment: Attending groups: No. New to unit. Continuing to assess.  Participating in groups: No. Taking medication as prescribed: Yes. Toleration medication: Yes. Family/Significant other contact made: No, will contact:  family member if patient consents Patient  understands diagnosis: Yes. Discussing patient identified problems/goals with staff: Yes. Medical problems stabilized or resolved: Yes. Denies suicidal/homicidal ideation: Yes. Issues/concerns per patient self-inventory: No. Other: n/a   New problem(s) identified: No, Describe:  n/a   New Short Term/Long Term Goal(s): detox, medication management for mood stabilization; development of comprehensive mental wellness/sobriety plan.   Discharge Plan or Barriers: CSW assessing for appropriate referrals. Pt sees her PCP only per chart notes.  Reason for Continuation of Hospitalization: Anxiety Depression Medication stabilization Suicidal ideation Withdrawal symptoms  Estimated Length of Stay: Friday 08/31/16  Attendees: Patient: 08/28/2016 10:36 AM  Physician: Dr. Dwyane Dee MD; Dr. Parke Poisson MD 08/28/2016 10:36 AM  Nursing: Minta Balsam RN 08/28/2016 10:36 AM  RN Care Manager: Lars Pinks CM 08/28/2016 10:36 AM  Social Worker: Maxie Better, LCSW 08/28/2016 10:36 AM  Recreational Therapist: x 08/28/2016 10:36 AM  Other: Lindell Spar NP; Mordecai Maes NP; Darnelle Maffucci Money NP 08/28/2016 10:36 AM  Other:  08/28/2016 10:36 AM  Other: 08/28/2016 10:36 AM    Scribe for Treatment Team: Dixon, LCSW 08/28/2016 10:36 AM
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
## 2016-08-28 NOTE — Progress Notes (Signed)
DAR NOTE: Patient presents with anxious affect and depressed mood.  Denies pain, auditory and visual hallucinations.  Described energy level as low and concentration as poor.  Reports withdrawal symptoms of tremors on self inventory form.  Rates depression at 10, hopelessness at 9, and anxiety at 10.  Maintained on routine safety checks.  Medications given as prescribed.  Support and encouragement offered as needed.  States goal for today is "see the doctor".  Patient is withdrawn and isolative to her room most of this shift.
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
## 2016-08-28 NOTE — [facility] Group Notes (Signed)
[facility] LCSW Group Therapy  08/28/2016 3:24 PM  Type of Therapy:  Group Therapy  Participation Level:  Did Not Attend-pt invited. Chose to remain in bed.   Summary of Progress/Problems: MHA Speaker came to talk about his personal journey with substance abuse and addiction. The pt processed ways by which to relate to the speaker. MHA speaker provided handouts and educational information pertaining to groups and services offered by [facility].   Sheralyn Pinegar N Smart LCSW 08/28/2016, 3:24 PM

## 2016-08-29 ENCOUNTER — Encounter (HOSPITAL_COMMUNITY): Payer: Self-pay | Admitting: Behavioral Health

## 2016-08-29 DIAGNOSIS — F13239 Sedative, hypnotic or anxiolytic dependence with withdrawal, unspecified: Secondary | ICD-10-CM

## 2016-08-29 DIAGNOSIS — F419 Anxiety disorder, unspecified: Secondary | ICD-10-CM

## 2016-08-29 LAB — URINALYSIS, ROUTINE W REFLEX MICROSCOPIC
BILIRUBIN URINE: NEGATIVE
GLUCOSE, UA: NEGATIVE mg/dL
HGB URINE DIPSTICK: NEGATIVE
KETONES UR: NEGATIVE mg/dL
LEUKOCYTES UA: NEGATIVE
Nitrite: NEGATIVE
PH: 7 (ref 5.0–8.0)
PROTEIN: NEGATIVE mg/dL
Specific Gravity, Urine: 1.008 (ref 1.005–1.030)

## 2016-08-29 LAB — CBC WITH DIFFERENTIAL/PLATELET
BASOS PCT: 1 %
Basophils Absolute: 0.1 10*3/uL (ref 0.0–0.1)
EOS ABS: 0.3 10*3/uL (ref 0.0–0.7)
EOS PCT: 5 %
HCT: 35.5 % — ABNORMAL LOW (ref 36.0–46.0)
Hemoglobin: 12.4 g/dL (ref 12.0–15.0)
LYMPHS ABS: 1.7 10*3/uL (ref 0.7–4.0)
Lymphocytes Relative: 35 %
MCH: 33.2 pg (ref 26.0–34.0)
MCHC: 34.9 g/dL (ref 30.0–36.0)
MCV: 95.2 fL (ref 78.0–100.0)
Monocytes Absolute: 0.3 10*3/uL (ref 0.1–1.0)
Monocytes Relative: 6 %
Neutro Abs: 2.6 10*3/uL (ref 1.7–7.7)
Neutrophils Relative %: 53 %
PLATELETS: 226 10*3/uL (ref 150–400)
RBC: 3.73 MIL/uL — AB (ref 3.87–5.11)
RDW: 12.6 % (ref 11.5–15.5)
WBC: 4.9 10*3/uL (ref 4.0–10.5)

## 2016-08-29 LAB — COMPREHENSIVE METABOLIC PANEL
ALT: 18 U/L (ref 14–54)
ANION GAP: 9 (ref 5–15)
AST: 19 U/L (ref 15–41)
Albumin: 4.2 g/dL (ref 3.5–5.0)
Alkaline Phosphatase: 64 U/L (ref 38–126)
BUN: 16 mg/dL (ref 6–20)
CHLORIDE: 101 mmol/L (ref 101–111)
CO2: 27 mmol/L (ref 22–32)
CREATININE: 0.89 mg/dL (ref 0.44–1.00)
Calcium: 9.5 mg/dL (ref 8.9–10.3)
GFR calc non Af Amer: >60 mL/min (ref >60)
Glucose, Bld: 157 mg/dL — ABNORMAL HIGH (ref 65–99)
Potassium: 3.9 mmol/L (ref 3.5–5.1)
SODIUM: 137 mmol/L (ref 135–145)
Total Bilirubin: 0.9 mg/dL (ref 0.3–1.2)
Total Protein: 7.2 g/dL (ref 6.5–8.1)

## 2016-08-29 LAB — LIPID PANEL
CHOL/HDL RATIO: 3.5 ratio
Cholesterol: 173 mg/dL (ref 0–200)
HDL: 50 mg/dL (ref >40)
LDL CALC: 107 mg/dL — AB (ref 0–99)
Triglycerides: 78 mg/dL (ref <150)
VLDL: 16 mg/dL (ref 0–40)

## 2016-08-29 LAB — TSH: TSH: 0.55 u[IU]/mL (ref 0.350–4.500)

## 2016-08-29 LAB — ACETAMINOPHEN LEVEL

## 2016-08-29 LAB — GLUCOSE, CAPILLARY
GLUCOSE-CAPILLARY: 136 mg/dL — AB (ref 65–99)
Glucose-Capillary: 144 mg/dL — ABNORMAL HIGH (ref 65–99)

## 2016-08-29 LAB — RAPID URINE DRUG SCREEN, HOSP PERFORMED
AMPHETAMINES: NOT DETECTED
BARBITURATES: NOT DETECTED
Benzodiazepines: POSITIVE — AB
COCAINE: NOT DETECTED
Opiates: NOT DETECTED
TETRAHYDROCANNABINOL: NOT DETECTED

## 2016-08-29 LAB — SALICYLATE LEVEL

## 2016-08-29 LAB — ETHANOL: Alcohol, Ethyl (B): <5 mg/dL (ref <5)

## 2016-08-29 MED ORDER — SERTRALINE HCL 50 MG PO TABS
50.0000 mg | ORAL_TABLET | Freq: Every day | ORAL | Status: DC
  Administered 2016-08-30 – 2016-09-01 (×4): 50 mg via ORAL
  Filled 2016-08-29 (×7): qty 1

## 2016-08-29 MED ORDER — GABAPENTIN 100 MG PO CAPS
100.0000 mg | ORAL_CAPSULE | Freq: Two times a day (BID) | ORAL | Status: DC
  Administered 2016-08-29 – 2016-09-02 (×8): 100 mg via ORAL
  Filled 2016-08-29 (×11): qty 1

## 2016-08-29 NOTE — Progress Notes (Signed)
Recreation Therapy Notes  Date:08/29/2016 Time:9:30am Location: 300 Hall Dayroom  Group Topic: Stress Management  Goal Area(s) Addresses:  Patient will verbalize importance of using healthy stress management.  Patient will identify positive emotions associated with healthy stress management.   Intervention: Stress Management  Activity: Calming Color Relaxation Visualization . Recreation Therapy Intern introduced the stress management technique of visualization. Recreation Therapy Intern read a script that allowed patients to mentally visualize different colors. Recreation Therapy Intern played calming flute music. Patients were to follow along as script was read to engage in the activity.  Education:Stress Management, Discharge Planning.   Education Outcome:Acknowledges edcuation  Clinical Observations/Feedback:Pt did not attend group.  Rachel Meyer, Recreation Therapy Intern  Iliyana Convey, LRT/CTRS 

## 2016-08-29 NOTE — Progress Notes (Signed)
Pt was up at 0240, and came to the NS asking for her "prn" meaning the Ativan.  She was informed that it was too early, but she could get the Vistaril 25 mg which she was given.  Pt seemed a little up set that she could not get the Ativan, and while standing at the med window, stated that she felt like she was going to faint.  Writer called for assistance, and an MHT came to assist pt to her bed.  The vistaril was taken to her room and given.  She then declared that her BP was up and that her blood sugar was too low.  These were checked and her BP was 126/64 and her CBG was 144.  Pt was assured that these were good reading for the middle of the night.  Writer spent a few minutes talking with pt, and she was able to settle down and go back to bed.  Safety checks continue q15 minutes.

## 2016-08-29 NOTE — Progress Notes (Signed)
DAR NOTE: Patient presents with anxious affect and mood.  Reports period of restlessness and brief panic attack.  Patient reminded to use deep breathing exercise.  Patient reports being more anxious when around people and places.  Remained withdrawn and isolative most of this shift.  Denies auditory and visual hallucinations.  Refused Metformin due to fear of blood sugar dropping after use.  RNP made aware.  Maintained on routine safety checks.  Medications given as prescribed.  Support and encouragement offered as needed.

## 2016-08-29 NOTE — [facility] Suicide Risk Assessment (Signed)
[facility] INPATIENT:  Family/Significant Other Suicide Prevention Education  Suicide Prevention Education:  Education Completed; Tora PerchesMark Neese (pt's friend) [phone] has been identified by the patient as the family member/significant other with whom the patient will be residing, and identified as the person(s) who will aid the patient in the event of a mental health crisis (suicidal ideations/suicide attempt).  With written consent from the patient, the family member/significant other has been provided the following suicide prevention education, prior to the and/or following the discharge of the patient.  The suicide prevention education provided includes the following:  Suicide risk factors  Suicide prevention and interventions  National Suicide Hotline telephone number  [facility] assessment telephone number  [facility] Assistance 911  [facility] and/or [facility]idential Mobile Crisis Unit telephone number  Request made of family/significant other to:  Remove weapons (e.g., guns, rifles, knives), all items previously/currently identified as safety concern.    Remove drugs/medications (over-the-counter, prescriptions, illicit drugs), all items previously/currently identified as a safety concern.  The family member/significant other verbalizes understanding of the suicide prevention education information provided.  The family member/significant other agrees to remove the items of safety concern listed above.  Pt's roommate/friend had no further questions or concerns. SPE completed/aftercare plan reviewed. Mark asked to speak with pt's nurse regarding medical information he wished to share. Call then transferred to unit.   Calyx Hawker N Smart LCSW 08/29/2016, 11:04 AM

## 2016-08-29 NOTE — Progress Notes (Signed)
Pt continues to be very anxious this evening.  She has been to the nurse's station often with worried, somatic complaints.  She is very focused on receiving prns and often comes to ask for them before they are due.  She denies SI/HI/AVH at this time, but states her depression and anxiety are very high.  She stays in her room unless she is coming to the nurse's station.  Pt has not been observed in the dayroom this evening.  Pt is worried that the doctor is not going to find medication that will decrease her anxiety as this has been an issue for her since she was in her 8120's.  Support and encouragement offered.  Provider on call made some changes to her meds that was encouraging to her.  Discharge plans are in process.  Safety maintained with q15 minute checks.

## 2016-08-29 NOTE — Progress Notes (Signed)
[facility] MD Progress Note  08/29/2016 12:42 PM Tiffany Cardenas  MRN:  [id number]  Subjective: " I feel horrible. My blood pressure was really high yesterday. I have a headache today. The doctor started my on Cymbalta and its causing my thoughts to race even more. Its also increasing my panic attacks."  Evaluation on the unit: Face to face evaluation completed, case discussed during treatment team, chart reviewed. Patient admitted to [facility]  For worsening depression and anxiety following discontinuation of her psychiatric medications by her PCP. As per patient her Percocet,  Xanax, Ritlan and Adderall.  During this evaluation, patient continues to present very anxious and depressed. She continues to endorse hopelessness and anxiety without improvement. She endorses she continues to feel paranoid although she feels safe here. States, " I feel like when someone hands me a cup, they have spit in it although I know they have not." Denies SI, HI, or AVH and does not appear to be preoccupied with internal stimuli. Cymbalta initiated yesterday by MD and as per patient, she was on Cymbalta in the past with no relief of depression or anxiety. Endorses that while on Cymbalta in the past, she experienced increased anxiety and vision changes (seeing black spots). She endorses poor sleeping pattern and appetite as fair. At this time, she is able to contract for safety on the unit.  Principal Problem: MDD (major depressive disorder), recurrent, severe, with psychosis ([place]) Diagnosis:   Patient Active Problem List   Diagnosis Date Noted  . MDD (major depressive disorder), recurrent, severe, with psychosis ([place]) [F33.3] 08/28/2016    Priority: High  . MDD (major depressive disorder) [F32.9] 08/27/2016   Total Time spent with patient: 20 minutes  Past Psychiatric History: MDD, ADD, Bipolar disorder, GAD, panic attacks. Trial of psychiatric medications include Ritalin, Adderall (reports PCP would alternate the two) and  Xanax. No current therapist, counselor, psychiatrists. Reports multiple acute psychiatric hospitalizations in the past. She reports her last hospitalization was in 2008 for depression. No current therapist, psychiatrists or counselor.  Past Medical History:  Past Medical History:  Diagnosis Date  . Gout   . Panic attacks   . Thyroid disease     Past Surgical History:  Procedure Laterality Date  . ABDOMINAL HYSTERECTOMY    . CHOLECYSTECTOMY    . TONSILLECTOMY     Family History: History reviewed. No pertinent family history. Family Psychiatric  History: Unknown as per patient Social History:  History  Alcohol Use No     History  Drug Use No    Social History   Social History  . Marital status: Married    Spouse name: N/A  . Number of children: N/A  . Years of education: N/A   Social History Main Topics  . Smoking status: Never Smoker  . Smokeless tobacco: Never Used  . Alcohol use No  . Drug use: No  . Sexual activity: Not Asked   Other Topics Concern  . None   Social History Narrative  . None   Additional Social History:    Pain Medications: PCP just stopped prescribing her percocet Prescriptions: PCP stopping prescribing her ritlin and wants her to detox from xanex History of alcohol / drug use?: No history of alcohol / drug abuse    Sleep: Poor  Appetite:  Fair  Current Medications: Current Facility-Administered Medications  Medication Dose Route Frequency Provider Last Rate Last Dose  . acetaminophen (TYLENOL) tablet 650 mg  650 mg Oral Q6H PRN Vicenta Aly, NP  650 mg at 08/29/16 0932  . alum & mag hydroxide-simeth (MAALOX/MYLANTA) 200-200-20 MG/5ML suspension 30 mL  30 mL Oral Q4H PRN Okonkwo, Justina A, NP      . aspirin EC tablet 81 mg  81 mg Oral Daily Okonkwo, Justina A, NP   81 mg at 08/29/16 0932  . DULoxetine (CYMBALTA) DR capsule 30 mg  30 mg Oral Daily Phala Schraeder, Myer Peer, MD   30 mg at 08/28/16 1539  . EPINEPHrine (EPI-PEN) injection  0.3 mg  0.3 mg Intramuscular PRN Okonkwo, Justina A, NP      . hydrOXYzine (ATARAX/VISTARIL) tablet 25 mg  25 mg Oral Q6H PRN Chayla Shands, Myer Peer, MD   25 mg at 08/29/16 0235  . lisinopril (PRINIVIL,ZESTRIL) tablet 20 mg  20 mg Oral Daily Okonkwo, Justina A, NP   20 mg at 08/29/16 0932  . loperamide (IMODIUM) capsule 2-4 mg  2-4 mg Oral PRN Kelda Azad, Myer Peer, MD      . LORazepam (ATIVAN) tablet 1 mg  1 mg Oral Q6H PRN Trestin Vences, Myer Peer, MD   1 mg at 08/29/16 6213  . LORazepam (ATIVAN) tablet 1 mg  1 mg Oral QID Shakina Choy, Myer Peer, MD   1 mg at 08/29/16 1207   Followed by  . LORazepam (ATIVAN) tablet 1 mg  1 mg Oral TID Jmarion Christiano, Myer Peer, MD       Followed by  . [START ON 08/30/2016] LORazepam (ATIVAN) tablet 1 mg  1 mg Oral BID Tashanna Dolin, Myer Peer, MD       Followed by  . [START ON 09/01/2016] LORazepam (ATIVAN) tablet 1 mg  1 mg Oral Daily Latosha Gaylord A, MD      . magnesium hydroxide (MILK OF MAGNESIA) suspension 30 mL  30 mL Oral Daily PRN Okonkwo, Justina A, NP      . metFORMIN (GLUCOPHAGE) tablet 1,000 mg  1,000 mg Oral BID WC Okonkwo, Justina A, NP   1,000 mg at 08/29/16 0935  . multivitamin with minerals tablet 1 tablet  1 tablet Oral Daily Briyah Wheelwright, Myer Peer, MD   1 tablet at 08/29/16 0933  . ondansetron (ZOFRAN-ODT) disintegrating tablet 4 mg  4 mg Oral Q6H PRN Gaile Allmon A, MD      . prazosin (MINIPRESS) capsule 1 mg  1 mg Oral QHS Patriciaann Clan E, PA-C   1 mg at 08/28/16 2310  . thiamine (VITAMIN B-1) tablet 100 mg  100 mg Oral Daily Jackelynn Hosie, Myer Peer, MD   100 mg at 08/29/16 0933  . thyroid (ARMOUR) tablet 90 mg  90 mg Oral q morning - 10a Okonkwo, Justina A, NP   90 mg at 08/29/16 0934  . traZODone (DESYREL) tablet 50 mg  50 mg Oral QHS,MR X 1 Laverle Hobby, PA-C   50 mg at 08/28/16 2310    Lab Results:  Results for orders placed or performed during the hospital encounter of 08/27/16 (from the past 48 hour(s))  Glucose, capillary     Status: Abnormal   Collection Time:  08/27/16  5:26 PM  Result Value Ref Range   Glucose-Capillary 106 (H) 65 - 99 mg/dL   Comment 1 Notify RN    Comment 2 Document in Chart   Glucose, capillary     Status: Abnormal   Collection Time: 08/27/16  8:43 PM  Result Value Ref Range   Glucose-Capillary 104 (H) 65 - 99 mg/dL  Glucose, capillary     Status: None   Collection Time: 08/28/16  6:06  AM  Result Value Ref Range   Glucose-Capillary 99 65 - 99 mg/dL  Glucose, capillary     Status: None   Collection Time: 08/28/16  2:34 PM  Result Value Ref Range   Glucose-Capillary 84 65 - 99 mg/dL  Urine rapid drug screen (hosp performed)     Status: Abnormal   Collection Time: 08/28/16  4:11 PM  Result Value Ref Range   Opiates NONE DETECTED NONE DETECTED   Cocaine NONE DETECTED NONE DETECTED   Benzodiazepines POSITIVE (A) NONE DETECTED   Amphetamines NONE DETECTED NONE DETECTED   Tetrahydrocannabinol NONE DETECTED NONE DETECTED   Barbiturates NONE DETECTED NONE DETECTED    Comment:        DRUG SCREEN FOR MEDICAL PURPOSES ONLY.  IF CONFIRMATION IS NEEDED FOR ANY PURPOSE, NOTIFY LAB WITHIN 5 DAYS.        LOWEST DETECTABLE LIMITS FOR URINE DRUG SCREEN Drug Class       Cutoff (ng/mL) Amphetamine      1000 Barbiturate      200 Benzodiazepine   200 Tricyclics       300 Opiates          300 Cocaine          300 THC              50 Performed at [facility], [address], [place], [place] [address]   Urinalysis, Routine w reflex microscopic     Status: Abnormal   Collection Time: 08/28/16  4:11 PM  Result Value Ref Range   Color, Urine STRAW (A) YELLOW   APPearance CLEAR CLEAR   Specific Gravity, Urine 1.008 1.005 - 1.030   pH 7.0 5.0 - 8.0   Glucose, UA NEGATIVE NEGATIVE mg/dL   Hgb urine dipstick NEGATIVE NEGATIVE   Bilirubin Urine NEGATIVE NEGATIVE   Ketones, ur NEGATIVE NEGATIVE mg/dL   Protein, ur NEGATIVE NEGATIVE mg/dL   Nitrite NEGATIVE NEGATIVE   Leukocytes, UA NEGATIVE NEGATIVE     Comment: Performed at [facility], [address] [place], [place] [address]  Glucose, capillary     Status: Abnormal   Collection Time: 08/28/16  5:19 PM  Result Value Ref Range   Glucose-Capillary 105 (H) 65 - 99 mg/dL   Comment 1 Notify RN   Glucose, capillary     Status: Abnormal   Collection Time: 08/29/16  2:42 AM  Result Value Ref Range   Glucose-Capillary 144 (H) 65 - 99 mg/dL  Glucose, capillary     Status: Abnormal   Collection Time: 08/29/16  6:05 AM  Result Value Ref Range   Glucose-Capillary 136 (H) 65 - 99 mg/dL  TSH     Status: None   Collection Time: 08/29/16  6:18 AM  Result Value Ref Range   TSH 0.550 0.350 - 4.500 uIU/mL    Comment: Performed by a 3rd Generation assay with a functional sensitivity of <=0.01 uIU/mL. Performed at [facility], [address] [place], [place] [address]   Lipid panel     Status: Abnormal   Collection Time: 08/29/16  6:18 AM  Result Value Ref Range   Cholesterol 173 0 - 200 mg/dL   Triglycerides 78 <930 mg/dL   HDL 50 >50 mg/dL   Total CHOL/HDL Ratio 3.5 RATIO   VLDL 16 0 - 40 mg/dL   LDL Cholesterol 159 (H) 0 - 99 mg/dL    Comment:        Total Cholesterol/HDL:CHD Risk  Coronary Heart Disease Risk Table                     Men   Women  1/2 Average Risk   3.4   3.3  Average Risk       5.0   4.4  2 X Average Risk   9.6   7.1  3 X Average Risk  23.4   11.0        Use the calculated Patient Ratio above and the CHD Risk Table to determine the patient's CHD Risk.        ATP III CLASSIFICATION (LDL):  <100     mg/dL   Optimal  100-129  mg/dL   Near or Above                    Optimal  130-159  mg/dL   Borderline  160-189  mg/dL   High  >190     mg/dL   Very High Performed at [place] [address] [place], [place] [address]   CBC with Differential/Platelet     Status: Abnormal   Collection Time: 08/29/16  6:18 AM  Result Value Ref Range   WBC 4.9 4.0 - 10.5 K/uL   RBC  3.73 (L) 3.87 - 5.11 MIL/uL   Hemoglobin 12.4 12.0 - 15.0 g/dL   HCT 35.5 (L) 36.0 - 46.0 %   MCV 95.2 78.0 - 100.0 fL   MCH 33.2 26.0 - 34.0 pg   MCHC 34.9 30.0 - 36.0 g/dL   RDW 12.6 11.5 - 15.5 %   Platelets 226 150 - 400 K/uL   Neutrophils Relative % 53 %   Neutro Abs 2.6 1.7 - 7.7 K/uL   Lymphocytes Relative 35 %   Lymphs Abs 1.7 0.7 - 4.0 K/uL   Monocytes Relative 6 %   Monocytes Absolute 0.3 0.1 - 1.0 K/uL   Eosinophils Relative 5 %   Eosinophils Absolute 0.3 0.0 - 0.7 K/uL   Basophils Relative 1 %   Basophils Absolute 0.1 0.0 - 0.1 K/uL    Comment: Performed at [facility], [place] [address] [place], [place] [address]  Comprehensive metabolic panel     Status: Abnormal   Collection Time: 08/29/16  6:18 AM  Result Value Ref Range   Sodium 137 135 - 145 mmol/L   Potassium 3.9 3.5 - 5.1 mmol/L   Chloride 101 101 - 111 mmol/L   CO2 27 22 - 32 mmol/L   Glucose, Bld 157 (H) 65 - 99 mg/dL   BUN 16 6 - 20 mg/dL   Creatinine, Ser 0.89 0.44 - 1.00 mg/dL   Calcium 9.5 8.9 - 10.3 mg/dL   Total Protein 7.2 6.5 - 8.1 g/dL   Albumin 4.2 3.5 - 5.0 g/dL   AST 19 15 - 41 U/L   ALT 18 14 - 54 U/L   Alkaline Phosphatase 64 38 - 126 U/L   Total Bilirubin 0.9 0.3 - 1.2 mg/dL   GFR calc non Af Amer >60 >60 mL/min   GFR calc Af Amer >60 >60 mL/min    Comment: (NOTE) The eGFR has been calculated using the CKD EPI equation. This calculation has not been validated in all clinical situations. eGFR's persistently <60 mL/min signify possible Chronic Kidney Disease.    Anion gap 9 5 - 15    Comment: Performed at [facility], [place] [address], [place], [place] [address]  Ethanol  Status: None   Collection Time: 08/29/16  6:18 AM  Result Value Ref Range   Alcohol, Ethyl (B) <5 <5 mg/dL    Comment:        LOWEST DETECTABLE LIMIT FOR SERUM ALCOHOL IS 5 mg/dL FOR MEDICAL PURPOSES ONLY Performed at [place] [address]  [place], [place] [address]   Salicylate level     Status: None   Collection Time: 08/29/16  6:18 AM  Result Value Ref Range   Salicylate Lvl <3.8 2.8 - 30.0 mg/dL    Comment: Performed at [facility], [place] [address], [place], [place] [address]  Acetaminophen level     Status: Abnormal   Collection Time: 08/29/16  6:18 AM  Result Value Ref Range   Acetaminophen (Tylenol), Serum <10 (L) 10 - 30 ug/mL    Comment:        THERAPEUTIC CONCENTRATIONS VARY SIGNIFICANTLY. A RANGE OF 10-30 ug/mL MAY BE AN EFFECTIVE CONCENTRATION FOR MANY PATIENTS. HOWEVER, SOME ARE BEST TREATED AT CONCENTRATIONS OUTSIDE THIS RANGE. ACETAMINOPHEN CONCENTRATIONS >150 ug/mL AT 4 HOURS AFTER INGESTION AND >50 ug/mL AT 12 HOURS AFTER INGESTION ARE OFTEN ASSOCIATED WITH TOXIC REACTIONS. Performed at [facility], [place] [address] [place], [place] [address]     Blood Alcohol level:  Lab Results  Component Value Date   ETH <5 08/29/2016   ETH <5 52/77/8242    Metabolic Disorder Labs: No results found for: HGBA1C, MPG No results found for: PROLACTIN Lab Results  Component Value Date   CHOL 173 08/29/2016   TRIG 78 08/29/2016   HDL 50 08/29/2016   CHOLHDL 3.5 08/29/2016   VLDL 16 08/29/2016   LDLCALC 107 (H) 08/29/2016    Physical Findings: AIMS: Facial and Oral Movements Muscles of Facial Expression: None, normal Lips and Perioral Area: None, normal Jaw: None, normal Tongue: None, normal,Extremity Movements Upper (arms, wrists, hands, fingers): None, normal Lower (legs, knees, ankles, toes): None, normal, Trunk Movements Neck, shoulders, hips: None, normal, Overall Severity Severity of abnormal movements (highest score from questions above): None, normal Incapacitation due to abnormal movements: None, normal Patient's awareness of abnormal movements (rate only patient's report): No Awareness, Dental Status Current problems with teeth and/or dentures?: No Does  patient usually wear dentures?: No  CIWA:  CIWA-Ar Total: 10 COWS:  COWS Total Score: 10  Musculoskeletal: Strength & Muscle Tone: within normal limits Gait & Station: normal Patient leans: N/A  Psychiatric Specialty Exam: Physical Exam  Nursing note and vitals reviewed. Constitutional: She is oriented to person, place, and time.  Neurological: She is alert and oriented to person, place, and time.    Review of Systems  Psychiatric/Behavioral: Positive for depression and substance abuse. Negative for hallucinations, memory loss and suicidal ideas. The patient is nervous/anxious and has insomnia.   All other systems reviewed and are negative.   Blood pressure (!) 137/59, pulse (!) 116, temperature 98.6 F (37 C), temperature source Oral, resp. rate 18, height '5\' 6"'$  (1.676 m), weight 190 lb (86.2 kg), SpO2 99 %.Body mass index is 30.67 kg/m.  General Appearance: Fairly Groomed  Eye Contact:  Fair  Speech:  Clear and Coherent and Normal Rate  Volume:  Decreased  Mood:  Anxious and Depressed  Affect:  Depressed and anxious  Thought Process:  Coherent, Linear and Descriptions of Associations: Intact  Orientation:  Full (Time, Place, and Person)  Thought Content:  Paranoid Ideation  Suicidal Thoughts:  No  Homicidal Thoughts:  No  Memory:  Immediate;   Fair Recent;   Fair  Judgement:  Fair  Insight:  Fair  Psychomotor Activity:  mild tremors noted   Concentration:  Concentration: Fair and Attention Span: Fair  Recall:  [employer] of Knowledge:  Fair  Language:  Good  Akathisia:  Negative  Handed:  Right  AIMS (if indicated):     Assets:  Desire for Improvement Resilience  ADL's:  Intact  Cognition:  WNL  Sleep:  Number of Hours: 5.25     Treatment Plan Summary: Daily contact with patient to assess and evaluate symptoms and progress in treatment   Medication management: Psychiatric conditions are unstable at this time. To reduce current symptoms to base line and  improve the patient's overall level of functioning will continue the following treatment plan with adjustments as noted;   Continue Ativan detox protocol to minimize risk of severe BZD withdrawal  Discontinue Cymbalta 30 mgrs QDAY  for depression, anxiety , history of fibromyalgia. Patient endorses possible side effects related to administration. Discussed presenting concerns with MD. We agreed to start a trial of Zoloft 50 mg po daily for depression and anxiety/   Will start Neurontin 100 mg po bid for anxiety.   Other:  Safety: Will continue 15 minute observation for safety checks. Patient is able to contract for safety on the unit at this time  Labs:TSH normal, HgbA1c, lipid panel LDL 107, CMP glucose 157, CBC low RBC and HCT, ethanol WNL, UDS, UA, salicylate normal. acetaminophen level WNL.  Continue to develop treatment plan to decrease risk of relapse upon discharge and to reduce the need for readmission.  Psycho-social education regarding relapse prevention and self care.  Health care follow up as needed for medical problems.   Continue to attend and participate in therapy.     Mordecai Maes, NP 08/29/2016, 12:42 PM  Agree with  NP Progress Note

## 2016-08-30 ENCOUNTER — Encounter (HOSPITAL_COMMUNITY): Payer: Self-pay | Admitting: Behavioral Health

## 2016-08-30 LAB — GLUCOSE, CAPILLARY
Glucose-Capillary: 105 mg/dL — ABNORMAL HIGH (ref 65–99)
Glucose-Capillary: 161 mg/dL — ABNORMAL HIGH (ref 65–99)

## 2016-08-30 LAB — HEMOGLOBIN A1C
Hgb A1c MFr Bld: 5.6 % (ref 4.8–5.6)
Mean Plasma Glucose: 114 mg/dL

## 2016-08-30 NOTE — Progress Notes (Signed)
[facility] MD Progress Note  08/30/2016 10:23 AM Tiffany Cardenas  MRN:  [id number]  Subjective: " I am having a hard day. I am still withdrawing. I feel shaky. I am scared and paranoid because I came off my medications to fast."  Evaluation on the unit: Face to face evaluation completed, case discussed during treatment team, chart reviewed. Patient admitted to [facility] for worsening depression and anxiety following discontinuation of her psychiatric medications by her PCP.   During this evaluation, her psychiatric condition shows no improvement. Patient remains very anxious and paranoid. She is focused on the discontinuation of her medications and the adverse effects that could occur. She continues to present as restless and continues to endorse panic like symptoms throughout the day. She endorses depression as 10, anxiety as 10, and hopelessness as 9. She becomes very paranoid when discussing medications or adjustments. She was switched from Cymbalta to Zoloft starting today and thus far, seems to be tolerating well. She denies suicidal ideations with plan or intent, homicidal ideas, AVH. There are no signs of response to internal stimuli. Reports sleeping pattern as good and appetite as fair. She remains isolative with little participation in unit milieu.  At this time, she is able to contract for safety on the unit.  Patient continues to decline Metformin. She has insisted since admission that taking the 1000 mg dose cause hypoglycemia. Reports this has been going on prior to her admission and she has spoken to her PCP regarding not taking the medication as prescribed.   As per nursing;  Patient presents with anxious affect and mood.  Reports period of restlessness and brief panic attack.  Patient reminded to use deep breathing exercise.  Patient reports being more anxious when around people and places.  Remained withdrawn and isolative most of this shift.  Denies auditory and visual hallucinations.  Refused  Metformin due to fear of blood sugar dropping after use.   Principal Problem: MDD (major depressive disorder), recurrent, severe, with psychosis ([place]) Diagnosis:   Patient Active Problem List   Diagnosis Date Noted  . MDD (major depressive disorder), recurrent, severe, with psychosis ([place]) [F33.3] 08/28/2016    Priority: High  . MDD (major depressive disorder) [F32.9] 08/27/2016   Total Time spent with patient: 20 minutes  Past Psychiatric History: MDD, ADD, Bipolar disorder, GAD, panic attacks. Trial of psychiatric medications include Ritalin, Adderall (reports PCP would alternate the two) and Xanax. No current therapist, counselor, psychiatrists. Reports multiple acute psychiatric hospitalizations in the past. She reports her last hospitalization was in 2008 for depression. No current therapist, psychiatrists or counselor.  Past Medical History:  Past Medical History:  Diagnosis Date  . Gout   . Panic attacks   . Thyroid disease     Past Surgical History:  Procedure Laterality Date  . ABDOMINAL HYSTERECTOMY    . CHOLECYSTECTOMY    . TONSILLECTOMY     Family History: History reviewed. No pertinent family history. Family Psychiatric  History: Unknown as per patient Social History:  History  Alcohol Use No     History  Drug Use No    Social History   Social History  . Marital status: Married    Spouse name: N/A  . Number of children: N/A  . Years of education: N/A   Social History Main Topics  . Smoking status: Never Smoker  . Smokeless tobacco: Never Used  . Alcohol use No  . Drug use: No  . Sexual activity: Not Asked   Other  Topics Concern  . None   Social History Narrative  . None   Additional Social History:    Pain Medications: PCP just stopped prescribing her percocet Prescriptions: PCP stopping prescribing her ritlin and wants her to detox from xanex History of alcohol / drug use?: No history of alcohol / drug abuse    Sleep: Good  Appetite:   Fair  Current Medications: Current Facility-Administered Medications  Medication Dose Route Frequency Provider Last Rate Last Dose  . acetaminophen (TYLENOL) tablet 650 mg  650 mg Oral Q6H PRN Okonkwo, Justina A, NP   650 mg at 08/29/16 1813  . alum & mag hydroxide-simeth (MAALOX/MYLANTA) 200-200-20 MG/5ML suspension 30 mL  30 mL Oral Q4H PRN Okonkwo, Justina A, NP      . aspirin EC tablet 81 mg  81 mg Oral Daily Okonkwo, Justina A, NP   81 mg at 08/30/16 0808  . EPINEPHrine (EPI-PEN) injection 0.3 mg  0.3 mg Intramuscular PRN Okonkwo, Justina A, NP      . gabapentin (NEURONTIN) capsule 100 mg  100 mg Oral BID Mordecai Maes, NP   100 mg at 08/30/16 0808  . hydrOXYzine (ATARAX/VISTARIL) tablet 25 mg  25 mg Oral Q6H PRN Cobos, Myer Peer, MD   25 mg at 08/30/16 0212  . lisinopril (PRINIVIL,ZESTRIL) tablet 20 mg  20 mg Oral Daily Okonkwo, Justina A, NP   20 mg at 08/30/16 0807  . loperamide (IMODIUM) capsule 2-4 mg  2-4 mg Oral PRN Cobos, Myer Peer, MD      . LORazepam (ATIVAN) tablet 1 mg  1 mg Oral Q6H PRN Cobos, Myer Peer, MD   1 mg at 08/30/16 0615  . LORazepam (ATIVAN) tablet 1 mg  1 mg Oral TID Cobos, Myer Peer, MD   1 mg at 08/30/16 0800   Followed by  . LORazepam (ATIVAN) tablet 1 mg  1 mg Oral BID Cobos, Myer Peer, MD       Followed by  . [START ON 09/01/2016] LORazepam (ATIVAN) tablet 1 mg  1 mg Oral Daily Cobos, Fernando A, MD      . magnesium hydroxide (MILK OF MAGNESIA) suspension 30 mL  30 mL Oral Daily PRN Okonkwo, Justina A, NP      . metFORMIN (GLUCOPHAGE) tablet 1,000 mg  1,000 mg Oral BID WC Okonkwo, Justina A, NP   1,000 mg at 08/28/16 0840  . multivitamin with minerals tablet 1 tablet  1 tablet Oral Daily Cobos, Myer Peer, MD   1 tablet at 08/30/16 [id number]  . ondansetron (ZOFRAN-ODT) disintegrating tablet 4 mg  4 mg Oral Q6H PRN Cobos, Fernando A, MD      . prazosin (MINIPRESS) capsule 1 mg  1 mg Oral QHS Simon, Spencer E, PA-C   1 mg at 08/29/16 2200  . sertraline  (ZOLOFT) tablet 50 mg  50 mg Oral Daily Mordecai Maes, NP   50 mg at 08/30/16 0809  . thiamine (VITAMIN B-1) tablet 100 mg  100 mg Oral Daily Cobos, Myer Peer, MD   100 mg at 08/30/16 0809  . thyroid (ARMOUR) tablet 90 mg  90 mg Oral q morning - 10a Okonkwo, Justina A, NP   90 mg at 08/30/16 1008  . traZODone (DESYREL) tablet 50 mg  50 mg Oral QHS,MR X 1 Laverle Hobby, PA-C   50 mg at 08/28/16 2310    Lab Results:  Results for orders placed or performed during the hospital encounter of 08/27/16 (from the past 48 hour(s))  Glucose, capillary     Status: None   Collection Time: 08/28/16  2:34 PM  Result Value Ref Range   Glucose-Capillary 84 65 - 99 mg/dL  Urine rapid drug screen (hosp performed)     Status: Abnormal   Collection Time: 08/28/16  4:11 PM  Result Value Ref Range   Opiates NONE DETECTED NONE DETECTED   Cocaine NONE DETECTED NONE DETECTED   Benzodiazepines POSITIVE (A) NONE DETECTED   Amphetamines NONE DETECTED NONE DETECTED   Tetrahydrocannabinol NONE DETECTED NONE DETECTED   Barbiturates NONE DETECTED NONE DETECTED    Comment:        DRUG SCREEN FOR MEDICAL PURPOSES ONLY.  IF CONFIRMATION IS NEEDED FOR ANY PURPOSE, NOTIFY LAB WITHIN 5 DAYS.        LOWEST DETECTABLE LIMITS FOR URINE DRUG SCREEN Drug Class       Cutoff (ng/mL) Amphetamine      1000 Barbiturate      200 Benzodiazepine   283 Tricyclics       151 Opiates          300 Cocaine          300 THC              50 Performed at [facility], [place] [address] [place], [place] [address]   Urinalysis, Routine w reflex microscopic     Status: Abnormal   Collection Time: 08/28/16  4:11 PM  Result Value Ref Range   Color, Urine STRAW (A) YELLOW   APPearance CLEAR CLEAR   Specific Gravity, Urine 1.008 1.005 - 1.030   pH 7.0 5.0 - 8.0   Glucose, UA NEGATIVE NEGATIVE mg/dL   Hgb urine dipstick NEGATIVE NEGATIVE   Bilirubin Urine NEGATIVE NEGATIVE   Ketones, ur NEGATIVE NEGATIVE mg/dL    Protein, ur NEGATIVE NEGATIVE mg/dL   Nitrite NEGATIVE NEGATIVE   Leukocytes, UA NEGATIVE NEGATIVE    Comment: Performed at [facility] [address], [place],  [address]  Glucose, capillary     Status: Abnormal   Collection Time: 08/28/16  5:19 PM  Result Value Ref Range   Glucose-Capillary 105 (H) 65 - 99 mg/dL   Comment 1 Notify RN   Glucose, capillary     Status: Abnormal   Collection Time: 08/29/16  2:42 AM  Result Value Ref Range   Glucose-Capillary 144 (H) 65 - 99 mg/dL  Glucose, capillary     Status: Abnormal   Collection Time: 08/29/16  6:05 AM  Result Value Ref Range   Glucose-Capillary 136 (H) 65 - 99 mg/dL  TSH     Status: None   Collection Time: 08/29/16  6:18 AM  Result Value Ref Range   TSH 0.550 0.350 - 4.500 uIU/mL    Comment: Performed by a 3rd Generation assay with a functional sensitivity of <=0.01 uIU/mL. Performed at [facility], [place] [address], [place],  [address]   Hemoglobin A1c     Status: None   Collection Time: 08/29/16  6:18 AM  Result Value Ref Range   Hgb A1c MFr Bld 5.6 4.8 - 5.6 %    Comment: (NOTE)         Pre-diabetes: 5.7 - 6.4         Diabetes: >6.4         Glycemic control for adults with diabetes: <7.0    Mean Plasma Glucose 114 mg/dL    Comment: (NOTE) Performed At: [facility] [address]  [place], [place] [id number] Lindon Romp MD AS:[id number] Performed at [facility], [place] [address], [place], [place] [address]   Lipid panel     Status: Abnormal   Collection Time: 08/29/16  6:18 AM  Result Value Ref Range   Cholesterol 173 0 - 200 mg/dL   Triglycerides 78 <150 mg/dL   HDL 50 >40 mg/dL   Total CHOL/HDL Ratio 3.5 RATIO   VLDL 16 0 - 40 mg/dL   LDL Cholesterol 107 (H) 0 - 99 mg/dL    Comment:        Total Cholesterol/HDL:CHD Risk Coronary Heart Disease Risk Table                     Men   Women  1/2 Average Risk   3.4   3.3   Average Risk       5.0   4.4  2 X Average Risk   9.6   7.1  3 X Average Risk  23.4   11.0        Use the calculated Patient Ratio above and the CHD Risk Table to determine the patient's CHD Risk.        ATP III CLASSIFICATION (LDL):  <100     mg/dL   Optimal  100-129  mg/dL   Near or Above                    Optimal  130-159  mg/dL   Borderline  160-189  mg/dL   High  >190     mg/dL   Very High Performed at [facility] [address] [place], [place] [address]   CBC with Differential/Platelet     Status: Abnormal   Collection Time: 08/29/16  6:18 AM  Result Value Ref Range   WBC 4.9 4.0 - 10.5 K/uL   RBC 3.73 (L) 3.87 - 5.11 MIL/uL   Hemoglobin 12.4 12.0 - 15.0 g/dL   HCT 35.5 (L) 36.0 - 46.0 %   MCV 95.2 78.0 - 100.0 fL   MCH 33.2 26.0 - 34.0 pg   MCHC 34.9 30.0 - 36.0 g/dL   RDW 12.6 11.5 - 15.5 %   Platelets 226 150 - 400 K/uL   Neutrophils Relative % 53 %   Neutro Abs 2.6 1.7 - 7.7 K/uL   Lymphocytes Relative 35 %   Lymphs Abs 1.7 0.7 - 4.0 K/uL   Monocytes Relative 6 %   Monocytes Absolute 0.3 0.1 - 1.0 K/uL   Eosinophils Relative 5 %   Eosinophils Absolute 0.3 0.0 - 0.7 K/uL   Basophils Relative 1 %   Basophils Absolute 0.1 0.0 - 0.1 K/uL    Comment: Performed at [facility], [place] [address] [place],  [address]  Comprehensive metabolic panel     Status: Abnormal   Collection Time: 08/29/16  6:18 AM  Result Value Ref Range   Sodium 137 135 - 145 mmol/L   Potassium 3.9 3.5 - 5.1 mmol/L   Chloride 101 101 - 111 mmol/L   CO2 27 22 - 32 mmol/L   Glucose, Bld 157 (H) 65 - 99 mg/dL   BUN 16 6 - 20 mg/dL   Creatinine, Ser 0.89 0.44 - 1.00 mg/dL   Calcium 9.5 8.9 - 10.3 mg/dL   Total Protein 7.2 6.5 - 8.1 g/dL   Albumin 4.2 3.5 - 5.0 g/dL   AST 19 15 - 41 U/L   ALT 18 14 -  54 U/L   Alkaline Phosphatase 64 38 - 126 U/L   Total Bilirubin 0.9 0.3 - 1.2 mg/dL   GFR calc non Af Amer >60 >60 mL/min   GFR calc Af Amer >60 >60 mL/min     Comment: (NOTE) The eGFR has been calculated using the CKD EPI equation. This calculation has not been validated in all clinical situations. eGFR's persistently <60 mL/min signify possible Chronic Kidney Disease.    Anion gap 9 5 - 15    Comment: Performed at [facility], [place] [address] [place], [place] [address]  Ethanol     Status: None   Collection Time: 08/29/16  6:18 AM  Result Value Ref Range   Alcohol, Ethyl (B) <5 <5 mg/dL    Comment:        LOWEST DETECTABLE LIMIT FOR SERUM ALCOHOL IS 5 mg/dL FOR MEDICAL PURPOSES ONLY Performed at [facility] [address] [place], [place] [address]   Salicylate level     Status: None   Collection Time: 08/29/16  6:18 AM  Result Value Ref Range   Salicylate Lvl <9.2 2.8 - 30.0 mg/dL    Comment: Performed at [facility], [place] [address] [place], [place] [address]  Acetaminophen level     Status: Abnormal   Collection Time: 08/29/16  6:18 AM  Result Value Ref Range   Acetaminophen (Tylenol), Serum <10 (L) 10 - 30 ug/mL    Comment:        THERAPEUTIC CONCENTRATIONS VARY SIGNIFICANTLY. A RANGE OF 10-30 ug/mL MAY BE AN EFFECTIVE CONCENTRATION FOR MANY PATIENTS. HOWEVER, SOME ARE BEST TREATED AT CONCENTRATIONS OUTSIDE THIS RANGE. ACETAMINOPHEN CONCENTRATIONS >150 ug/mL AT 4 HOURS AFTER INGESTION AND >50 ug/mL AT 12 HOURS AFTER INGESTION ARE OFTEN ASSOCIATED WITH TOXIC REACTIONS. Performed at [facility], [place] [address] [place], [place] [address]   Glucose, capillary     Status: Abnormal   Collection Time: 08/30/16  6:05 AM  Result Value Ref Range   Glucose-Capillary 105 (H) 65 - 99 mg/dL   Comment 1 Notify RN    Comment 2 Document in Chart     Blood Alcohol level:  Lab Results  Component Value Date   ETH <5 08/29/2016   ETH <5 44/81/8563    Metabolic Disorder Labs: Lab Results  Component Value Date   HGBA1C 5.6 08/29/2016   MPG 114  08/29/2016   No results found for: PROLACTIN Lab Results  Component Value Date   CHOL 173 08/29/2016   TRIG 78 08/29/2016   HDL 50 08/29/2016   CHOLHDL 3.5 08/29/2016   VLDL 16 08/29/2016   LDLCALC 107 (H) 08/29/2016    Physical Findings: AIMS: Facial and Oral Movements Muscles of Facial Expression: None, normal Lips and Perioral Area: None, normal Jaw: None, normal Tongue: None, normal,Extremity Movements Upper (arms, wrists, hands, fingers): None, normal Lower (legs, knees, ankles, toes): None, normal, Trunk Movements Neck, shoulders, hips: None, normal, Overall Severity Severity of abnormal movements (highest score from questions above): None, normal Incapacitation due to abnormal movements: None, normal Patient's awareness of abnormal movements (rate only patient's report): No Awareness, Dental Status Current problems with teeth and/or dentures?: No Does patient usually wear dentures?: No  CIWA:  CIWA-Ar Total: 8 COWS:  COWS Total Score: 10  Musculoskeletal: Strength & Muscle Tone: within normal limits Gait & Station: normal Patient leans: N/A  Psychiatric Specialty Exam: Physical Exam  Nursing note and vitals reviewed. Constitutional: She is oriented to  person, place, and time.  Neurological: She is alert and oriented to person, place, and time.    Review of Systems  Psychiatric/Behavioral: Positive for depression and substance abuse. Negative for hallucinations, memory loss and suicidal ideas. The patient is nervous/anxious and has insomnia.   All other systems reviewed and are negative.   Blood pressure 109/79, pulse (!) 126, temperature 97.8 F (36.6 C), temperature source Oral, resp. rate 18, height '5\' 6"'$  (1.676 m), weight 190 lb (86.2 kg), SpO2 99 %.Body mass index is 30.67 kg/m.  General Appearance: Fairly Groomed  Eye Contact:  Fair  Speech:  Clear and Coherent and Normal Rate  Volume:  Decreased  Mood:  Anxious and Depressed  Affect:  Depressed and  anxious  Thought Process:  Coherent, Linear and Descriptions of Associations: Intact  Orientation:  Full (Time, Place, and Person)  Thought Content:  Paranoid Ideation and Rumination  Suicidal Thoughts:  No denies. Is able to contract for safety   Homicidal Thoughts:  No   Memory:  Immediate;   Fair Recent;   Fair  Judgement:  Fair  Insight:  Fair  Psychomotor Activity:  mild tremors noted   Concentration:  Concentration: Fair and Attention Span: Fair  Recall:  [employer] of Knowledge:  Fair  Language:  Good  Akathisia:  Negative  Handed:  Right  AIMS (if indicated):     Assets:  Desire for Improvement Resilience  ADL's:  Intact  Cognition:  WNL  Sleep:  Number of Hours: 6.75     Treatment Plan Summary: Daily contact with patient to assess and evaluate symptoms and progress in treatment   Medication management: Psychiatric conditions remain unstable at this time. Patient continues to present with anxiety without improvement and intermittent panic like-symptoms. She remains very focused on benzodiazeopine discontinuation. To reduce current symptoms to base line and improve the patient's overall level of functioning will continue the following treatment plan without adjustments;   Continue Ativan detox protocol to minimize risk of severe BZD withdrawal  Continue Zoloft 50 mg po daily for depression and anxiety. Patient seems to be tolerating well.   Continue Neurontin 100 mg po bid for anxiety with titrations as appropriate.  Other:  Safety: Will continue 15 minute observation for safety checks. Patient is able to contract for safety on the unit at this time  Labs: No new labs resulted  as of today.   Continue to develop treatment plan to decrease risk of relapse upon discharge and to reduce the need for readmission.  Psycho-social education regarding relapse prevention and self care.  Health care follow up as needed for medical problems.   Continue to attend and  participate in therapy.     Mordecai Maes, NP 08/30/2016, 10:23 AM   Patient ID: Tiffany Cardenas, female   DOB: 12-19-1950, 66 y.o.   MRN: [id number]

## 2016-08-30 NOTE — Progress Notes (Signed)
  DATA ACTION RESPONSE  Objective- Pt. is visible in the room, seen resting in bed while reading a book.  Presents with an anxious    affect and mood.C/o of anxiety while endorsing  withdrawal s/s. Pt states that she is not in a hurry to be d/c.  Subjective- Denies having any SI/HI/AVH/Pain at this time. Pt. states "I need something for my anxiety, I've been having racing thoughts".Is cooperative and remain safe on the unit.  1:1 interaction in private to establish rapport. Encouragement, education, & support given from staff.  PRN Ativan (CIWA 10) and  Vistaril requested and will re-eval accordingly.   Safety maintained with Q 15 checks. Continue with POC.

## 2016-08-30 NOTE — Progress Notes (Signed)
Patient did not attend the evening speaker NA meeting. Pt remained sleeping during group time.   

## 2016-08-30 NOTE — Progress Notes (Signed)
Did not attend group 

## 2016-08-30 NOTE — Plan of Care (Signed)
Problem: Activity: Goal: Interest or engagement in activities will improve Outcome: Not Progressing Pt did not attended wrap-up group this evening provided with encouragement.

## 2016-08-30 NOTE — Progress Notes (Signed)
Pt has been asleep all evening since the beginning of the shift.  She just woke up and came to the NS asking for her night meds.  Pt also asked for Vistaril for anxiety at this time.  Pt denies SI/HI/AVH.  She was given the Minipress 1 mg after checking her VS along with the Vistaril she requested.  Pt is still anxious, but does not appear to be as anxious as in the previous two nights.  She says she feels a little rested since she was able to sleep.  She denies having any withdrawal symptoms at this time.  Support and encouragement offered.  Discharge plans are in process.  Safety maintained with q15 minute checks.

## 2016-08-30 NOTE — Progress Notes (Signed)
Patient ID: Tiffany LynnMargaret A Cardenas, female   DOB: [birth year], 66 y.o.   MRN: [id number] D  ---  Pt agrees to contract for safety.  She maintains an anxious , worried affect.  Pt has good eye contact and is cooperative with staff.  She has spent most of today in bed in her room.  Her CBG was taken this AM and at aprox. 1445 hrs , see results column. The DR held the 1700 hr dose for fear of bottoming the pt out overnight.  Pt wants the dosage changed to 500 mg BID.  Dr. Is aware.   She refused the Metformin this AM .  Pt gets along well with room mate.  She tends to be somatic at times  .  No negative behaviors.  ---  A  Provide support and safety  --  R ---  Pt remains safe on unit

## 2016-08-30 NOTE — [facility] Group Notes (Signed)
[facility] LCSW Group Therapy  08/30/2016 2:32 PM  Type of Therapy:  Group Therapy  Participation Level:  Did Not Attend-pt invited. Chose to remain in bed.   Summary of Progress/Problems:  Finding Balance in Life. Today's group focused on defining balance in one's own words, identifying things that can knock one off balance, and exploring healthy ways to maintain balance in life. Group members were asked to provide an example of a time when they felt off balance, describe how they handled that situation,and process healthier ways to regain balance in the future. Group members were asked to share the most important tool for maintaining balance that they [facility] and how they plan to apply this method after discharge.   Christianjames Soule N Smart LCSW 08/30/2016, 2:32 PM

## 2016-08-31 DIAGNOSIS — F19939 Other psychoactive substance use, unspecified with withdrawal, unspecified: Secondary | ICD-10-CM

## 2016-08-31 DIAGNOSIS — F19239 Other psychoactive substance dependence with withdrawal, unspecified: Secondary | ICD-10-CM

## 2016-08-31 LAB — GLUCOSE, CAPILLARY
Glucose-Capillary: 105 mg/dL — ABNORMAL HIGH (ref 65–99)
Glucose-Capillary: 130 mg/dL — ABNORMAL HIGH (ref 65–99)
Glucose-Capillary: 170 mg/dL — ABNORMAL HIGH (ref 65–99)

## 2016-08-31 MED ORDER — LORAZEPAM 1 MG PO TABS
1.0000 mg | ORAL_TABLET | Freq: Four times a day (QID) | ORAL | Status: DC | PRN
  Administered 2016-08-31 – 2016-09-02 (×4): 1 mg via ORAL
  Filled 2016-08-31 (×4): qty 1

## 2016-08-31 MED ORDER — METFORMIN HCL 500 MG PO TABS
500.0000 mg | ORAL_TABLET | Freq: Two times a day (BID) | ORAL | Status: DC
  Administered 2016-08-31 – 2016-09-04 (×3): 500 mg via ORAL
  Filled 2016-08-31 (×9): qty 1

## 2016-08-31 MED ORDER — HYDROXYZINE HCL 25 MG PO TABS
25.0000 mg | ORAL_TABLET | Freq: Four times a day (QID) | ORAL | Status: DC | PRN
  Administered 2016-08-31 – 2016-09-01 (×2): 25 mg via ORAL
  Filled 2016-08-31 (×2): qty 1

## 2016-08-31 NOTE — Progress Notes (Signed)
D Pt endorses depression, anxiety and worry. A She completed her daily assessment and on it she wrote she denied SI today and she rated her anxiety, depression,and hopelessness " 11/14/08", respectively.   R Safety in place.

## 2016-08-31 NOTE — Progress Notes (Signed)
Patient would like to continue taking metformin as she does at home. Will change metformin to 500 mg BID.

## 2016-08-31 NOTE — Progress Notes (Signed)
[facility] MD Progress Note  08/31/2016 2:04 PM Tiffany Cardenas  MRN:  [id number]   Subjective:  Patient reports feeling anxious, while lying still in bed and staring at the ceiling. "My thoughts are racing while I am laying here." States she plans to go outside today and get up more.  She feels that this will take a long time because she has been on Xanax for 40 years.   Objective: Patient is found lying in the bed and the patient has bee asked numerous times to be more active, but she hasn't done it. I encourage her to get out of bed, go to group, go outside when allowed, and interact with people. She did say her anxious feeling and reported racing thoughts go away when interacting with others. She appears depressed and asks how long she should be like this due to the withdrawal symptoms and she was informed that more active she is the better the results. After reviewing chart found that patient's story doesn't match her PCP's documentation.   07/24/16: Dr. Reed Pandy: Patient presents today in follow-up from a recent emergency room visit at an outside facility. She was seen for her blood pressure which was high. EMS had initially seen her blood pressure was 194/123. Gotten as high as 211/120. She was taking her lisinopril 10 mg daily. States nothing else is changed. Any chest pain or shortness of breath. No headache or visual changes. Blood pressure here is 140/72. Her weight is up 9 pounds since December 2017. She also reports that she decided to come off of her Ritalin and oxycodone and has slowly weaned herself off. Pain score today is 0.  08/25/16: [facility]: "Tiffany Cardenas is a 66 y.o. year old female brought to the ED by friend with previous psychiatric history significant for panic disorder and depression presenting to the ED today for evaluation of chest pain, panic attacks and depression. Patient is coming from [place] where she recently moved to. She reports that she suffers from "horrible panic  attacks" and has been taking Xanax since 1978. She used to take 6 mg xanax daily from [phone] and then was tapered to 2 mg daily since then which she has continued to take. She is being prescribed Xanax from her PCP. She recently moved from [place] to [place] 3 months ago, and feels that this has stressed her out and increased the frequency of her panic attacks. She has called the EMT many times in the last few weeks for panic attacks and they have told her to take Xanax so she has been taking 3 mg daily sometimes and is now running out of her Xanax. She also reports she has not been able to sleep more than an hour every night for 14 days now. She denies any seizures or perceptual disturbances or hallucinations. Patient was educated on how she might be developing tolerance to benzodiazepines and now developing withdrawal symptoms. We had a conversation about how, unfortunately, benzodiazepines are addictive and should not be prescribed in the long term. We discussed options like going to a crisis center for benzo withdrawal symptoms, where she can be monitored and put on a long acting benzo and then eventually tapered off all benzo. Patient did not feel that will help. She says she wants to continue Xanax because that's the only thing that helps. She has tried Klonopin, Ativan and Valium and they don't help. She has also been tried on SSRI at some point in the past for depression but  is not on them currently.  She then went on to say that "this is all a misunderstanding, I was brought here because I thought I was having a heart attack. I only wanted to see a psychiatrist for depression, not for my panic attacks. Please don't ask my PCP to stop prescribing my Xanax because its the only thing that works. I just want to go home." Patient was reassured that this is not meant to be punitive, we understand the difficult situation she is in. and the recommendations are for her best health. She also reports a history  of depressed mood, but denies anhedonia, reports good energy levels, says she volunteers, and is eating alright. She occasionally has thoughts of guilt and worthlessness but she prays and they go away. She denies ever having SI, denies HI and denies AVH or paranoid ideations. She reports her friend Tiffany Cardenas who brought her to the hospital today is her good support system. She denies any other substance use. She is willing to [facility] and is also willing to follow up with her psychiatrist intake [facility] aug[facility]"  On 07/24/16 patient was prescribed Xanax 1 mg PO BID #60 with 3 refills. On 08-25-16 she was out of medications early and she informed me today she could not get refills until 09-01-16. Patient was confronted and patient stated that her PCP lied in her note and that she will never go see her again. She retracted her story in a previous note about her PCP discontinuing her Xanax and then said that [facility] discontinued it but [facility] gave [facility] 6 tablets of [facility]ax. Patient continues to state she wants to be off of Xanax and is currently in detox with Ativan taper.    Principal Problem: MDD (major depressive disorder), recurrent, severe, with psychosis (HCC) Diagnosis:   Patient Active Problem List   Diagnosis Date Noted  . MDD (major depressive disorder), recurrent, severe, with psychosis (HCC) [F33.3] 08/28/2016  . MDD (major depressive disorder) [F32.9] 08/27/2016   Total Time spent with patient: 25 minutes  Past Psychiatric History: See H&P  Past Medical History:  Past Medical History:  Diagnosis Date  . Gout   . Panic attacks   . Thyroid disease     Past Surgical History:  Procedure Laterality Date  . ABDOMINAL HYSTERECTOMY    . CHOLECYSTECTOMY    . TONSILLECTOMY     Family History: History reviewed. No pertinent family history. Family Psychiatric  History: See H&P Social History:  History  Alcohol Use No     History  Drug Use No    Social History    Social History  . Marital status: Married    Spouse name: N/A  . Number of children: N/A  . Years of education: N/A   Social History Main Topics  . Smoking status: Never Smoker  . Smokeless tobacco: Never Used  . Alcohol use No  . Drug use: No  . Sexual activity: Not Asked   Other Topics Concern  . None   Social History Narrative  . None   Additional Social History:    Pain Medications: PCP just stopped prescribing her percocet Prescriptions: PCP stopping prescribing her ritlin and wants her to detox from xanex History of alcohol / drug use?: No history of alcohol / drug abuse  Sleep: Good  Appetite:  Good  Current Medications: Current Facility-Administered Medications  Medication Dose Route Frequency Provider Last Rate Last Dose  . acetaminophen (TYLENOL) tablet 650  mg  650 mg Oral Q6H PRN Okonkwo, Justina A, NP   650 mg at 08/29/16 1813  . alum & mag hydroxide-simeth (MAALOX/MYLANTA) 200-200-20 MG/5ML suspension 30 mL  30 mL Oral Q4H PRN Okonkwo, Justina A, NP      . aspirin EC tablet 81 mg  81 mg Oral Daily Okonkwo, Justina A, NP   81 mg at 08/31/16 0853  . EPINEPHrine (EPI-PEN) injection 0.3 mg  0.3 mg Intramuscular PRN Okonkwo, Justina A, NP      . gabapentin (NEURONTIN) capsule 100 mg  100 mg Oral BID Denzil Magnuson, NP   100 mg at 08/31/16 0853  . hydrOXYzine (ATARAX/VISTARIL) tablet 25 mg  25 mg Oral Q6H PRN Cobos, Rockey Situ, MD   25 mg at 08/31/16 0618  . lisinopril (PRINIVIL,ZESTRIL) tablet 20 mg  20 mg Oral Daily Okonkwo, Justina A, NP   20 mg at 08/31/16 0853  . loperamide (IMODIUM) capsule 2-4 mg  2-4 mg Oral PRN Cobos, Rockey Situ, MD      . LORazepam (ATIVAN) tablet 1 mg  1 mg Oral Q6H PRN Cobos, Rockey Situ, MD   1 mg at 08/30/16 2105  . [START ON 09/01/2016] LORazepam (ATIVAN) tablet 1 mg  1 mg Oral Daily Cobos, Fernando A, MD      . magnesium hydroxide (MILK OF MAGNESIA) suspension 30 mL  30 mL Oral Daily PRN Okonkwo, Justina A, NP      . metFORMIN  (GLUCOPHAGE) tablet 1,000 mg  1,000 mg Oral BID WC Okonkwo, Justina A, NP   1,000 mg at 08/31/16 0852  . multivitamin with minerals tablet 1 tablet  1 tablet Oral Daily Cobos, Rockey Situ, MD   1 tablet at 08/31/16 0853  . ondansetron (ZOFRAN-ODT) disintegrating tablet 4 mg  4 mg Oral Q6H PRN Cobos, Fernando A, MD      . prazosin (MINIPRESS) capsule 1 mg  1 mg Oral QHS Donell Sievert E, PA-C   1 mg at 08/30/16 2105  . sertraline (ZOLOFT) tablet 50 mg  50 mg Oral Daily Denzil Magnuson, NP   50 mg at 08/31/16 0858  . thiamine (VITAMIN B-1) tablet 100 mg  100 mg Oral Daily Cobos, Rockey Situ, MD   100 mg at 08/31/16 0857  . thyroid (ARMOUR) tablet 90 mg  90 mg Oral q morning - 10a Okonkwo, Justina A, NP   90 mg at 08/31/16 1214  . traZODone (DESYREL) tablet 50 mg  50 mg Oral QHS,MR X 1 Kerry Hough, PA-C   50 mg at 08/30/16 2146    Lab Results:  Results for orders placed or performed during the hospital encounter of 08/27/16 (from the past 48 hour(s))  Glucose, capillary     Status: Abnormal   Collection Time: 08/30/16  6:05 AM  Result Value Ref Range   Glucose-Capillary 105 (H) 65 - 99 mg/dL   Comment 1 Notify RN    Comment 2 Document in Chart   Glucose, capillary     Status: Abnormal   Collection Time: 08/30/16  4:51 PM  Result Value Ref Range   Glucose-Capillary 161 (H) 65 - 99 mg/dL   Comment 1 Notify RN   Glucose, capillary     Status: Abnormal   Collection Time: 08/31/16  5:45 AM  Result Value Ref Range   Glucose-Capillary 105 (H) 65 - 99 mg/dL    Blood Alcohol level:  Lab Results  Component Value Date   ETH <5 08/29/2016   ETH <5 07/26/2014  Metabolic Disorder Labs: Lab Results  Component Value Date   HGBA1C 5.6 08/29/2016   MPG 114 08/29/2016   No results found for: PROLACTIN Lab Results  Component Value Date   CHOL 173 08/29/2016   TRIG 78 08/29/2016   HDL 50 08/29/2016   CHOLHDL 3.5 08/29/2016   VLDL 16 08/29/2016   LDLCALC 107 (H) 08/29/2016     Physical Findings: AIMS: Facial and Oral Movements Muscles of Facial Expression: None, normal Lips and Perioral Area: None, normal Jaw: None, normal Tongue: None, normal,Extremity Movements Upper (arms, wrists, hands, fingers): None, normal Lower (legs, knees, ankles, toes): None, normal, Trunk Movements Neck, shoulders, hips: None, normal, Overall Severity Severity of abnormal movements (highest score from questions above): None, normal Incapacitation due to abnormal movements: None, normal Patient's awareness of abnormal movements (rate only patient's report): No Awareness, Dental Status Current problems with teeth and/or dentures?: No Does patient usually wear dentures?: No  CIWA:  CIWA-Ar Total: 6 COWS:  COWS Total Score: 10  Musculoskeletal: Strength & Muscle Tone: within normal limits Gait & Station: normal Patient leans: N/A  Psychiatric Specialty Exam: Physical Exam  Nursing note and vitals reviewed. Constitutional: She is oriented to person, place, and time. She appears well-developed and well-nourished.  Cardiovascular: Normal rate.   Respiratory: Effort normal.  Neurological: She is alert and oriented to person, place, and time.    Review of Systems  Constitutional: Negative.   HENT: Negative.   Eyes: Negative.   Respiratory: Negative.   Cardiovascular: Negative.   Gastrointestinal: Negative.   Genitourinary: Negative.   Musculoskeletal: Negative.   Skin: Negative.   Neurological: Negative.   Endo/Heme/Allergies: Negative.     Blood pressure (!) 135/57, pulse 72, temperature 97.7 F (36.5 C), temperature source Oral, resp. rate 18, height 5\' 6"  (1.676 m), weight 86.2 kg (190 lb), SpO2 99 %.Body mass index is 30.67 kg/m.  General Appearance: Disheveled  Eye Contact:  Good  Speech:  Clear and Coherent  Volume:  Normal  Mood:  Euthymic  Affect:  Flat  Thought Process:  Coherent and Descriptions of Associations: Intact  Orientation:  Full (Time,  Place, and Person)  Thought Content:  WDL  Suicidal Thoughts:  No  Homicidal Thoughts:  No  Memory:  Immediate;   Good  Judgement:  Fair  Insight:  Fair  Psychomotor Activity:  Normal  Concentration:  Concentration: Good  Recall:  Good  Fund of Knowledge:  Fair  Language:  Good  Akathisia:  No  Handed:  Right  AIMS (if indicated):     Assets:  Financial Resources/Insurance  ADL's:  Intact  Cognition:  WNL  Sleep:  Number of Hours: 6.75     Treatment Plan Summary: Daily contact with patient to assess and evaluate symptoms and progress in treatment, Medication management and Plan is to:  -Continue CIWA and COWS protocols -Encourage increased activities -Encourage group therapy participation -Will have physician review notes and discuss thorough safety plan when patient is discharged.   Gerlene Burdockravis B Annalea Alguire, FNP 08/31/2016, 2:04 PM

## 2016-08-31 NOTE — Progress Notes (Signed)
Patient did not attend AA group meeting. 

## 2016-08-31 NOTE — [facility] Group Notes (Signed)
[facility] LCSW Group Therapy  08/31/2016 3:22 PM  Type of Therapy:  Group Therapy  Participation Level:  Did Not Attend-pt invited. Chose to remain in bed.   Summary of Progress/Problems: Today's Topic: Overcoming Obstacles. Patients identified one short term goal and potential obstacles in reaching this goal. Patients processed barriers involved in overcoming these obstacles. Patients identified steps necessary for overcoming these obstacles and explored motivation (internal and external) for facing these difficulties head on.   Terryl Molinelli N Smart LCSW 08/31/2016, 3:22 PM

## 2016-08-31 NOTE — Progress Notes (Signed)
Patient refused to take her nightly vital sign

## 2016-08-31 NOTE — Tx Team (Signed)
Interdisciplinary Treatment and Diagnostic Plan Update  08/31/2016 Time of Session: [id number] Tiffany LynnMargaret A Rhodes MRN: [id number]  Principal Diagnosis: MDD, recurrent, severe  Secondary Diagnoses: Principal Problem:   MDD (major depressive disorder), recurrent, severe, with psychosis (HCC) Active Problems:   MDD (major depressive disorder)   Current Medications:  Current Facility-Administered Medications  Medication Dose Route Frequency Provider Last Rate Last Dose  . acetaminophen (TYLENOL) tablet 650 mg  650 mg Oral Q6H PRN Okonkwo, Justina A, NP   650 mg at 08/29/16 1813  . alum & mag hydroxide-simeth (MAALOX/MYLANTA) 200-200-20 MG/5ML suspension 30 mL  30 mL Oral Q4H PRN Okonkwo, Justina A, NP      . aspirin EC tablet 81 mg  81 mg Oral Daily Okonkwo, Justina A, NP   81 mg at 08/30/16 0808  . EPINEPHrine (EPI-PEN) injection 0.3 mg  0.3 mg Intramuscular PRN Okonkwo, Justina A, NP      . gabapentin (NEURONTIN) capsule 100 mg  100 mg Oral BID Denzil Magnusonhomas, Lashunda, NP   100 mg at 08/30/16 1622  . hydrOXYzine (ATARAX/VISTARIL) tablet 25 mg  25 mg Oral Q6H PRN Cobos, Rockey SituFernando A, MD   25 mg at 08/31/16 0618  . lisinopril (PRINIVIL,ZESTRIL) tablet 20 mg  20 mg Oral Daily Okonkwo, Justina A, NP   20 mg at 08/30/16 0807  . loperamide (IMODIUM) capsule 2-4 mg  2-4 mg Oral PRN Cobos, Rockey SituFernando A, MD      . LORazepam (ATIVAN) tablet 1 mg  1 mg Oral Q6H PRN Cobos, Rockey SituFernando A, MD   1 mg at 08/30/16 2105  . LORazepam (ATIVAN) tablet 1 mg  1 mg Oral BID Cobos, Rockey SituFernando A, MD   1 mg at 08/30/16 1621   Followed by  . [START ON 09/01/2016] LORazepam (ATIVAN) tablet 1 mg  1 mg Oral Daily Cobos, Fernando A, MD      . magnesium hydroxide (MILK OF MAGNESIA) suspension 30 mL  30 mL Oral Daily PRN Okonkwo, Justina A, NP      . metFORMIN (GLUCOPHAGE) tablet 1,000 mg  1,000 mg Oral BID WC Okonkwo, Justina A, NP   1,000 mg at 08/28/16 0840  . multivitamin with minerals tablet 1 tablet  1 tablet Oral Daily Cobos, Rockey SituFernando  A, MD   1 tablet at 08/30/16 [phone]  . ondansetron (ZOFRAN-ODT) disintegrating tablet 4 mg  4 mg Oral Q6H PRN Cobos, Fernando A, MD      . prazosin (MINIPRESS) capsule 1 mg  1 mg Oral QHS Donell SievertSimon, Spencer E, PA-C   1 mg at 08/30/16 2105  . sertraline (ZOLOFT) tablet 50 mg  50 mg Oral Daily Denzil Magnusonhomas, Lashunda, NP   50 mg at 08/30/16 0809  . thiamine (VITAMIN B-1) tablet 100 mg  100 mg Oral Daily Cobos, Rockey SituFernando A, MD   100 mg at 08/30/16 0809  . thyroid (ARMOUR) tablet 90 mg  90 mg Oral q morning - 10a Okonkwo, Justina A, NP   90 mg at 08/30/16 1008  . traZODone (DESYREL) tablet 50 mg  50 mg Oral QHS,MR X 1 Kerry HoughSimon, Spencer E, PA-C   50 mg at 08/30/16 2146   PTA Medications: Prescriptions Prior to Admission  Medication Sig Dispense Refill Last Dose  . ALPRAZolam (XANAX) 1 MG tablet Take 1 mg by mouth 2 (two) times daily.   08/26/2016  . aspirin 81 MG chewable tablet Chew 81 mg by mouth daily.   08/27/2016  . Calcium Carb-Cholecalciferol (CALCIUM 600 + D PO) Take 1 tablet by mouth  every morning.   08/26/2016  . Cyanocobalamin (VITAMIN B-12 SL) Place 1 tablet under the tongue daily.   Past Week  . EPINEPHrine (EPI-PEN) 0.3 mg/0.3 mL DEVI Inject 0.3 mg into the muscle as needed (For allergic reaction).    years ago  . lisinopril (PRINIVIL,ZESTRIL) 20 MG tablet Take 20 mg by mouth every morning.   08/27/2016  . Lysine 500 MG CAPS Take 500 mg by mouth every morning.   08/25/2016  . metFORMIN (GLUCOPHAGE) 1000 MG tablet Take 500 mg by mouth See admin instructions. Takes half a tablet (500mg ) only if her blood sugar is greater than 140.   08/26/2016  . senna-docusate (SENOKOT-S) 8.6-50 MG tablet Take 2 tablets by mouth as needed for mild constipation.    Past Month  . thyroid (ARMOUR) 60 MG tablet Take 90 mg by mouth every morning.    08/27/2016    Patient Stressors: Medication change or noncompliance  Patient Strengths: Average or above average intelligence Capable of independent living General fund of  knowledge Motivation for treatment/growth Supportive family/friends Work skills  Treatment Modalities: Medication Management, Group therapy, Case management,  1 to 1 session with clinician, Psychoeducation, Recreational therapy.   Physician Treatment Plan for Primary Diagnosis:MDD, recurrent, severe  Medication Management: Evaluate patient's response, side effects, and tolerance of medication regimen.  Therapeutic Interventions: 1 to 1 sessions, Unit Group sessions and Medication administration.  Evaluation of Outcomes: Progressing   Physician Treatment Plan for Secondary Diagnosis: Principal Problem:   MDD (major depressive disorder), recurrent, severe, with psychosis (HCC) Active Problems:   MDD (major depressive disorder)  Long Term Goal(s): Improvement in symptoms so as ready for discharge Improvement in symptoms so as ready for discharge   Short Term Goals: Ability to identify changes in lifestyle to reduce recurrence of condition will improve Ability to verbalize feelings will improve Compliance with prescribed medications will improve Ability to identify triggers associated with substance abuse/mental health issues will improve Ability to disclose and discuss suicidal ideas Ability to identify and develop effective coping behaviors will improve     Medication Management: Evaluate patient's response, side effects, and tolerance of medication regimen.  Therapeutic Interventions: 1 to 1 sessions, Unit Group sessions and Medication administration.  Evaluation of Outcomes: Progressing    RN Treatment Plan for Primary Diagnosis: MDD, recurrent, severe Long Term Goal(s): Knowledge of disease and therapeutic regimen to maintain health will improve  Short Term Goals: Ability to remain free from injury will improve, Ability to verbalize feelings will improve and Ability to disclose and discuss suicidal ideas  Medication Management: RN will administer medications as ordered by  provider, will assess and evaluate patient's response and provide education to patient for prescribed medication. RN will report any adverse and/or side effects to prescribing provider.  Therapeutic Interventions: 1 on 1 counseling sessions, Psychoeducation, Medication administration, Evaluate responses to treatment, Monitor vital signs and CBGs as ordered, Perform/monitor CIWA, COWS, AIMS and Fall Risk screenings as ordered, Perform wound care treatments as ordered.  Evaluation of Outcomes: Progressing    LCSW Treatment Plan for Primary Diagnosis: MDD, recurrent, severe Long Term Goal(s): Safe transition to appropriate next level of care at discharge, Engage patient in therapeutic group addressing interpersonal concerns.  Short Term Goals: Engage patient in aftercare planning with referrals and resources, Facilitate patient progression through stages of change regarding substance use diagnoses and concerns and Identify triggers associated with mental health/substance abuse issues  Therapeutic Interventions: Assess for all discharge needs, 1 to 1 time with Social  worker, Explore available resources and support systems, Assess for adequacy in community support network, Educate family and significant other(s) on suicide prevention, Complete Psychosocial Assessment, Interpersonal group therapy.  Evaluation of Outcomes: Progressing    Progress in Treatment: Attending groups: no-pt continues to stay in bed for most of day; somatic symptoms persist Participating in groups: No.  Taking medication as prescribed: Yes. Toleration medication: Yes. Family/Significant other contact made: Yes, SPE completed with pt's friend and roommate, Loraine Leriche.  Patient understands diagnosis: Yes.  Discussing patient identified problems/goals with staff: Yes. Medical problems stabilized or resolved: Yes. Denies suicidal/homicidal ideation: Yes. Issues/concerns per patient self-inventory: No. Other: n/a   New  problem(s) identified: No, Describe:  n/a   New Short Term/Long Term Goal(s): detox, medication management for mood stabilization; development of comprehensive mental wellness/sobriety plan.   Discharge Plan or Barriers: Follow-up with [facility]-pt insists that her [employer] insurance will take effect in August. In the meantime, pt set up with [facility]. She plans to return home with her roommate.   Reason for Continuation of Hospitalization: Anxiety Depression Medication stabilization Withdrawal symptoms  Estimated Length of Stay: Monday, 09/03/16  Attendees: Patient: 08/31/2016 8:28 AM  Physician: Dr. Lucianne Muss MD; Dr. Jama Flavors MD 08/31/2016 8:28 AM  Nursing: Stacy Gardner RN 08/31/2016 8:28 AM  RN Care Manager: Onnie Boer CM 08/31/2016 8:28 AM  Social Worker: Trula Slade, LCSW 08/31/2016 8:28 AM  Recreational Therapist: x 08/31/2016 8:28 AM  Other: Armandina Stammer NP; Hillery Jacks NP Feliz Beam Money NP 08/31/2016 8:28 AM  Other:  08/31/2016 8:28 AM  Other: 08/31/2016 8:28 AM    Scribe for Treatment Team: Ledell Peoples Smart, LCSW 08/31/2016 8:28 AM

## 2016-08-31 NOTE — Progress Notes (Signed)
Recreation Therapy Notes  Date: 08/31/2016 Time: 9:30am Location: [address] Dayroom  Group Topic: Stress Management  Goal Area(s) Addresses:  Patient will verbalize importance of using healthy stress management.  Patient will identify positive emotions associated with healthy stress management.   Intervention: Stress Management  Activity : Self-Esteem Meditation. Recreation Therapy Intern introduced the stress management technique of meditation. Recreation Therapy Intern read a script that allowed patients to focus on boosting their self-esteem. Recreation Therapy Intern played calming music. Patients were to follow along as script was read to engage in the activity.  Education: Stress Management, Discharge Planning.   Education Outcome: Needs additional education  Clinical Observations/Feedback: Pt did not attend group.  Rachel Meyer, Recreation Therapy Intern  Georgeanne Frankland, LRT/CTRS 

## 2016-08-31 NOTE — Progress Notes (Signed)
Envelope of a sum of $10 given to Clinical research associatewriter by security to give to Pt. Envelope given to Pt. Pt is aware.

## 2016-09-01 ENCOUNTER — Encounter (HOSPITAL_COMMUNITY): Payer: Self-pay | Admitting: Behavioral Health

## 2016-09-01 LAB — GLUCOSE, CAPILLARY
GLUCOSE-CAPILLARY: 98 mg/dL (ref 65–99)
Glucose-Capillary: 106 mg/dL — ABNORMAL HIGH (ref 65–99)
Glucose-Capillary: 132 mg/dL — ABNORMAL HIGH (ref 65–99)

## 2016-09-01 MED ORDER — SERTRALINE HCL 100 MG PO TABS
100.0000 mg | ORAL_TABLET | Freq: Every day | ORAL | Status: DC
  Administered 2016-09-02: 100 mg via ORAL
  Filled 2016-09-01 (×2): qty 1

## 2016-09-01 MED ORDER — HYDROXYZINE HCL 50 MG PO TABS
50.0000 mg | ORAL_TABLET | Freq: Four times a day (QID) | ORAL | Status: AC | PRN
  Administered 2016-09-01 – 2016-09-03 (×6): 50 mg via ORAL
  Filled 2016-09-01 (×7): qty 1

## 2016-09-01 MED ORDER — SERTRALINE HCL 50 MG PO TABS
50.0000 mg | ORAL_TABLET | Freq: Every day | ORAL | Status: AC
  Administered 2016-09-01: 50 mg via ORAL
  Filled 2016-09-01: qty 1

## 2016-09-01 NOTE — [facility] Group Notes (Signed)
The Power of Vulnerability  Date:  09/01/2016  Time:  1300  Type of Therapy:  Nurse Education  /  The group focuses on watching a 20 min You Tube video labeled " The Power of Vulnerability". The group is then guided to discuss their feelings and what they can learn form this.   Participation Level:  Active  Participation Quality:  Attentive  Affect:  Anxious  Cognitive:  Confused  Insight:  Limited  Engagement in Group:  Limited  Modes of Intervention:  Education  Summary of Progress/Problems:  Rich BraveDuke, Latrisa Hellums Lynn 09/01/2016, 4:55 PM

## 2016-09-01 NOTE — Progress Notes (Signed)
D    Pt came to the nurses station complaining of increased anxiety and said she felt like her blood sugar was high and she needed to take glucophage and that she hasnt taken any in the past 2 or 3 days because the dosage was 1000mg  and her most recent prescribed dose was 500 mg  A   Spoke to Np and received orders for ativan and her glucophage orders were changed to the way her doctor had prescribed them    Medications administered and effectiveness monitored   Q 15 min checks   Verbal support given   Talked to the patient about the new medications she will be taking for the chemical imbalance causing her depression and anxiety R    Pt is safe and receptive to verbal support and became less anxious after taking the Ativan and getting her medications adjusted

## 2016-09-01 NOTE — Progress Notes (Signed)
D Pt has had a difficult time today with handling her intense feelings of guilt, sadness, loneliness and fear. She has shared with this Clinical research associatewriter that she has struggled with OCD for " over  40 years now" and has been on adderrall, xanax and ritalin " for at least that long". A She completed her daily assessment and on this she wrote she denied experiencing SI today yet she rated her feelings of depression, hopelessness and anxeity " 11/14/08", respectively. She spent much time talking to this Clinical research associatewriter today.Marland Kitchen.about her years of struggles, alone, no job, nobody to care for her , the ritualistic behaviors she experiences because of   "my OCD" . She called for writer to " come see about me" x2 today. When writer found he rin her room , she said "I don't feel so good...what do you think is wrong with me?". VS were checked and were stable. Writer sat with pt x2, processing her feelings and behaviors  And reassuring her. Vistaril was increased to 50 mg po prn q6' and zoloft was increased to 100 mg ( and pt received one-time order of zoloft 50 mg). R Safety in place.

## 2016-09-01 NOTE — Progress Notes (Signed)
[facility] MD Progress Note  09/01/2016 11:36 AM Tiffany Cardenas  MRN:  [id number]   Subjective:  Patient states, " I am just feeling bad. I am still very anxious. I feel like I cant get up. I try to go to group but feel so anxious. I keep having racing thoughts. I been on Xanax for 40 years and It does not work anymore. I don't know what to do. "  Objective: Face to face evaluation completed, case discussed with MD, chart reviewed. Patient  For several days in a row, is noted in the bed laying down. Patient has been encouraged several times by staff to be more participative however, her participation remains limited in therapeutic milieu.  Patient continues to endorse heightened level of anxiety and remains focused on benzodiazepines. Dicussed discontinuation of Xanax again with patient as she initially reported and patient is now reporting that her PCP did not discontinue this medication and the medication was discontinued by [facility]. Advised patient that she currently has refills on her Xanax by her PCP and patient acknowledged this however reported she would not have the medication refilled as she does not want to continue this medication after 40 years of use and she prefers to start Ativan. Advised  patient that the Ativan protocol was completed and would not be restarted and patient visibly upset. Discussed coping mechanisms with patient as well as long term use of benzodiazepines and patient still seems focused on benzo use.   Patient continues to present with a depressed mood. She denies SI, HI, AVH and does not appear to be internally preoccupied. She endorses improvement in resting pattern and fair appetite. Her insight remains very poor. At this time, she is able to contract for safety on the unit.     Principal Problem: <principal problem not specified> Diagnosis:   Patient Active Problem List   Diagnosis Date Noted  . Withdrawal symptoms, drug or narcotic (HCC) [F19.939] 08/31/2016  .  MDD (major depressive disorder) [F32.9] 08/27/2016   Total Time spent with patient: 25 minutes  Past Psychiatric History: See H&P  Past Medical History:  Past Medical History:  Diagnosis Date  . Gout   . Panic attacks   . Thyroid disease     Past Surgical History:  Procedure Laterality Date  . ABDOMINAL HYSTERECTOMY    . CHOLECYSTECTOMY    . TONSILLECTOMY     Family History: History reviewed. No pertinent family history. Family Psychiatric  History: See H&P Social History:  History  Alcohol Use No     History  Drug Use No    Social History   Social History  . Marital status: Divorced    Spouse name: N/A  . Number of children: N/A  . Years of education: N/A   Social History Main Topics  . Smoking status: Never Smoker  . Smokeless tobacco: Never Used  . Alcohol use No  . Drug use: No  . Sexual activity: Not Asked   Other Topics Concern  . None   Social History Narrative  . None   Additional Social History:    Pain Medications: PCP just stopped prescribing her percocet Prescriptions: PCP stopping prescribing her ritlin and wants her to detox from xanex History of alcohol / drug use?: No history of alcohol / drug abuse  Sleep: improving   Appetite:  Good  Current Medications: Current Facility-Administered Medications  Medication Dose Route Frequency Provider Last Rate Last Dose  . acetaminophen (TYLENOL) tablet 650 mg  650  mg Oral Q6H PRN Beryle Lathekonkwo, Justina A, NP   650 mg at 08/31/16 1553  . alum & mag hydroxide-simeth (MAALOX/MYLANTA) 200-200-20 MG/5ML suspension 30 mL  30 mL Oral Q4H PRN Okonkwo, Justina A, NP      . aspirin EC tablet 81 mg  81 mg Oral Daily Okonkwo, Justina A, NP   81 mg at 09/01/16 0820  . EPINEPHrine (EPI-PEN) injection 0.3 mg  0.3 mg Intramuscular PRN Okonkwo, Justina A, NP      . gabapentin (NEURONTIN) capsule 100 mg  100 mg Oral BID Denzil Magnusonhomas, Zymier Rodgers, NP   100 mg at 09/01/16 [id number]  . hydrOXYzine (ATARAX/VISTARIL) tablet 25 mg  25 mg  Oral Q6H PRN Nira ConnBerry, Jason A, NP   25 mg at 09/01/16 0824  . lisinopril (PRINIVIL,ZESTRIL) tablet 20 mg  20 mg Oral Daily Okonkwo, Justina A, NP   20 mg at 09/01/16 0819  . LORazepam (ATIVAN) tablet 1 mg  1 mg Oral Q6H PRN Nira ConnBerry, Jason A, NP   1 mg at 09/01/16 0610  . magnesium hydroxide (MILK OF MAGNESIA) suspension 30 mL  30 mL Oral Daily PRN Okonkwo, Justina A, NP      . metFORMIN (GLUCOPHAGE) tablet 500 mg  500 mg Oral BID WC Nira ConnBerry, Jason A, NP   500 mg at 08/31/16 2155  . multivitamin with minerals tablet 1 tablet  1 tablet Oral Daily Cobos, Rockey SituFernando A, MD   1 tablet at 09/01/16 0819  . prazosin (MINIPRESS) capsule 1 mg  1 mg Oral QHS Donell SievertSimon, Spencer E, PA-C   1 mg at 08/31/16 2155  . sertraline (ZOLOFT) tablet 50 mg  50 mg Oral Daily Denzil Magnusonhomas, Karlis Cregg, NP   50 mg at 09/01/16 0820  . thiamine (VITAMIN B-1) tablet 100 mg  100 mg Oral Daily Cobos, Rockey SituFernando A, MD   100 mg at 09/01/16 0819  . thyroid (ARMOUR) tablet 90 mg  90 mg Oral q morning - 10a Okonkwo, Justina A, NP   90 mg at 08/31/16 1214  . traZODone (DESYREL) tablet 50 mg  50 mg Oral QHS,MR X 1 Kerry HoughSimon, Spencer E, PA-C   50 mg at 08/31/16 2303    Lab Results:  Results for orders placed or performed during the hospital encounter of 08/27/16 (from the past 48 hour(s))  Glucose, capillary     Status: Abnormal   Collection Time: 08/30/16  4:51 PM  Result Value Ref Range   Glucose-Capillary 161 (H) 65 - 99 mg/dL   Comment 1 Notify RN   Glucose, capillary     Status: Abnormal   Collection Time: 08/31/16  5:45 AM  Result Value Ref Range   Glucose-Capillary 105 (H) 65 - 99 mg/dL  Glucose, capillary     Status: Abnormal   Collection Time: 08/31/16  5:01 PM  Result Value Ref Range   Glucose-Capillary 130 (H) 65 - 99 mg/dL  Glucose, capillary     Status: Abnormal   Collection Time: 08/31/16  9:49 PM  Result Value Ref Range   Glucose-Capillary 170 (H) 65 - 99 mg/dL  Glucose, capillary     Status: Abnormal   Collection Time: 09/01/16  5:50  AM  Result Value Ref Range   Glucose-Capillary 106 (H) 65 - 99 mg/dL    Blood Alcohol level:  Lab Results  Component Value Date   ETH <5 08/29/2016   ETH <5 07/26/2014    Metabolic Disorder Labs: Lab Results  Component Value Date   HGBA1C 5.6 08/29/2016   MPG 114  08/29/2016   No results found for: PROLACTIN Lab Results  Component Value Date   CHOL 173 08/29/2016   TRIG 78 08/29/2016   HDL 50 08/29/2016   CHOLHDL 3.5 08/29/2016   VLDL 16 08/29/2016   LDLCALC 107 (H) 08/29/2016    Physical Findings: AIMS: Facial and Oral Movements Muscles of Facial Expression: None, normal Lips and Perioral Area: None, normal Jaw: None, normal Tongue: None, normal,Extremity Movements Upper (arms, wrists, hands, fingers): None, normal Lower (legs, knees, ankles, toes): None, normal, Trunk Movements Neck, shoulders, hips: None, normal, Overall Severity Severity of abnormal movements (highest score from questions above): None, normal Incapacitation due to abnormal movements: None, normal Patient's awareness of abnormal movements (rate only patient's report): No Awareness, Dental Status Current problems with teeth and/or dentures?: No Does patient usually wear dentures?: No  CIWA:  CIWA-Ar Total: 3 COWS:  COWS Total Score: 10  Musculoskeletal: Strength & Muscle Tone: within normal limits Gait & Station: normal Patient leans: N/A  Psychiatric Specialty Exam: Physical Exam  Nursing note and vitals reviewed. Constitutional: She is oriented to person, place, and time. She appears well-developed and well-nourished.  Cardiovascular: Normal rate.   Respiratory: Effort normal.  Neurological: She is alert and oriented to person, place, and time.    Review of Systems  Constitutional: Negative.   HENT: Negative.   Eyes: Negative.   Respiratory: Negative.   Cardiovascular: Negative.   Gastrointestinal: Negative.   Genitourinary: Negative.   Musculoskeletal: Negative.   Skin:  Negative.   Neurological: Negative.   Endo/Heme/Allergies: Negative.   Psychiatric/Behavioral: Positive for depression and substance abuse. Negative for hallucinations, memory loss and suicidal ideas. The patient is nervous/anxious. The patient does not have insomnia.   All other systems reviewed and are negative.   Blood pressure (!) 126/91, pulse (!) 178, temperature 98.2 F (36.8 C), temperature source Oral, resp. rate 16, height 5\' 6"  (1.676 m), weight 190 lb (86.2 kg), SpO2 99 %.Body mass index is 30.67 kg/m.  General Appearance: Disheveled  Eye Contact:  Good  Speech:  Clear and Coherent  Volume:  Normal  Mood:  Depressed  Affect:  Flat and anxious   Thought Process:  Coherent and Descriptions of Associations: Intact  Orientation:  Full (Time, Place, and Person)  Thought Content:  WDL Denies AVH. continues to ruminate about benzo use   Suicidal Thoughts:  No  Homicidal Thoughts:  No  Memory:  Immediate;   Good  Judgement:  Fair  Insight:  Lacking  Psychomotor Activity:  Normal  Concentration:  Concentration: Good  Recall:  Good  Fund of Knowledge:  Fair  Language:  Good  Akathisia:  No  Handed:  Right  AIMS (if indicated):     Assets:  Financial Resources/Insurance  ADL's:  Intact  Cognition:  WNL  Sleep:  Number of Hours: 5.75     Treatment Plan Summary: Daily contact with patient to assess and evaluate symptoms and progress in treatment, Medication management and Plan is to:   Medication management: Patient seems to be presenting with manipulative behaviors. She remains high focused on benzo use. Her initial admission reports has changed. She now focuses on not wanting to continue with Xanax and hopes to start Ativan. To continue  reduce current symptoms to base line and improve the patient's overall level of functioning will continue the following treatment plan with  adjustments as noted;   Continue Ativan detox protocol however, will not reorder once complete.  Increased Vistaril to 50 mg po q6hrs  as needed for anxiety.  Discussed this with MD who agreed with plan.    Increase Zoloft to 100 mg po daily for depression and anxiety. Patient seems to be tolerating well.   Continue Neurontin 100 mg po bid for anxiety with titrations as appropriate.   Other:  Safety: Will continue 15 minute observation for safety checks. Patient is able to contract for safety on the unit at this time  Labs: No new labs resulted  as of today.   Continue to develop treatment plan to decrease risk of relapse upon discharge and to reduce the need for readmission.  Psycho-social education regarding relapse prevention and self care.  Health care follow up as needed for medical problems.   Continue to attend and participate in therapy.      Denzil Magnuson, NP 09/01/2016, 11:36 AM   Patient ID: Tiffany Cardenas, female   DOB: 04-16-1950, 66 y.o.   MRN: [id number]

## 2016-09-01 NOTE — [facility] Group Notes (Signed)
  [facility] LCSW Group Therapy Note  09/01/2016  and  10:00 AM  Type of Therapy and Topic:  Group Therapy: Avoiding Self-Sabotaging and Enabling Behaviors  Participation Level:  Minimal  Participation Quality:  Attentive  Affect:  Flat and Lethargic  Cognitive:  Oriented  Insight:  Limited  Engagement in Therapy:  Limited   Therapeutic models used: Cognitive Behavioral Therapy,  Person-Centered Therapy and Motivational Interviewing  Modes of Intervention:  Discussion, Exploration, Orientation, Rapport Building, Socialization and Support   Summary of Progress/Problems:  The main focus of today's process group was for the patient to identify ways in which they have in the past sabotaged their own recovery. Motivational Interviewing was utilized to identify motivation they may have for wanting to change. The Stages of Change were explained using a handout, and patients identified where they currently are with regard to stages of change. Patient reports she is in pre contemplation stage and has no motivation to develop plan for change at present. Patient was unable to share any event or activity she is looking forward to in the remaining time of 2018. Patient received encouragement from others.    Carney Bernatherine C Yohan Samons, LCSW

## 2016-09-01 NOTE — Progress Notes (Signed)
Patient did not attend the evening speaker AA meeting. Pt was notified that group was beginning but remained in bed.   

## 2016-09-02 ENCOUNTER — Encounter (HOSPITAL_COMMUNITY): Payer: Self-pay | Admitting: Behavioral Health

## 2016-09-02 LAB — GLUCOSE, CAPILLARY
GLUCOSE-CAPILLARY: 114 mg/dL — AB (ref 65–99)
GLUCOSE-CAPILLARY: 101 mg/dL — AB (ref 65–99)
Glucose-Capillary: 134 mg/dL — ABNORMAL HIGH (ref 65–99)

## 2016-09-02 MED ORDER — SERTRALINE HCL 50 MG PO TABS
50.0000 mg | ORAL_TABLET | Freq: Every day | ORAL | Status: DC
  Administered 2016-09-02 – 2016-09-04 (×3): 50 mg via ORAL
  Filled 2016-09-02 (×4): qty 1

## 2016-09-02 MED ORDER — QUETIAPINE FUMARATE 25 MG PO TABS
25.0000 mg | ORAL_TABLET | Freq: Two times a day (BID) | ORAL | Status: DC
  Administered 2016-09-02 – 2016-09-04 (×5): 25 mg via ORAL
  Filled 2016-09-02 (×7): qty 1

## 2016-09-02 MED ORDER — QUETIAPINE FUMARATE 50 MG PO TABS
50.0000 mg | ORAL_TABLET | Freq: Every day | ORAL | Status: DC
  Administered 2016-09-02 – 2016-09-03 (×2): 50 mg via ORAL
  Filled 2016-09-02 (×3): qty 1

## 2016-09-02 NOTE — Progress Notes (Signed)
D Pt presents to med window this am, she has worried, anxious expression on her face. She is wringing her hands a she speaks to writer. She speaks in a barelyClinical research associate-audible voice. She is worried. She refused the scheduled 100 mg zoloft, saying " I want them to lower my zoloft back to 50 mg". She requests and is given a prn dose of 50 mg vistaril po, for c/o nervousness and constant worry. A SHe does complete her daily assessment and on thsi she wrote she deneid SI today and she rated ehr depression, hopelessness and anxiety " 11/14/08", respectively. She says over and over and over" I don't know what I'm going to do.Marland Kitchen.Marland Kitchen.I feel awful". R Safety is in place. Writer discussed pt status with NP LT and NP adjusting meds. NP tospeak with pharmacist about medicaiton change.

## 2016-09-02 NOTE — Progress Notes (Signed)
[facility] Progress Note  09/02/2016 9:15 AM Tiffany Cardenas  MRN:  [id number]   Subjective:  Patient states, " I took the increased dose of Zoloft yesterday. It made my anxiety worse. I don't want to take that high dose anymore. "  Objective: Face to face evaluation completed chart reviewed. Doing this evaluation, patient is alert and oriented x4 and cooperative. She endorses some side effects to increased dose of Zoloft as noted above. She continues to present as very anxious and somatic when discussing medications especially discontinuation of benzo's.  When discussing medications, she continues to ask the exact schedule and time those medications can be administered. She was observed to be more participative in unit milieu yesterday and today she is noted sitting up in bed instead of laying down. Endorses that when she closes her eyes, she sees, " dark places and cemeteries. She continues to endorse racing thoughts however is unable to explain those thoughts. She denies SI, AVH, or homicidal ideas. She does not appear to be internally preoccupied. Continued to encourage development of coping skills for anxiety. She rates depression as 10, anxiety as 10, hopelessness as 10. At this time, she is able to contract for safety on the unit.   As per nurses;   Pt was very anxious and tearful when approaching the writer    She was talking about the increased dose of Zoloft causing her to not feel right and she said she is coming out of her skin  She asked if it could be changed back to 50 MG.      Principal Problem: MDD (major depressive disorder) Diagnosis:   Patient Active Problem List   Diagnosis Date Noted  . MDD (major depressive disorder), recurrent, severe, with psychosis (HCC) [F33.3] 08/28/2016    Priority: High  . Withdrawal symptoms, drug or narcotic (HCC) [F19.939] 08/31/2016  . MDD (major depressive disorder) [F32.9] 08/27/2016   Total Time spent with patient: 25 minutes  Past Psychiatric  History: See H&P  Past Medical History:  Past Medical History:  Diagnosis Date  . Gout   . Panic attacks   . Thyroid disease     Past Surgical History:  Procedure Laterality Date  . ABDOMINAL HYSTERECTOMY    . CHOLECYSTECTOMY    . TONSILLECTOMY     Family History: History reviewed. No pertinent family history. Family Psychiatric  History: See H&P Social History:  History  Alcohol Use No     History  Drug Use No    Social History   Social History  . Marital status: Divorced    Spouse name: N/A  . Number of children: N/A  . Years of education: N/A   Social History Main Topics  . Smoking status: Never Smoker  . Smokeless tobacco: Never Used  . Alcohol use No  . Drug use: No  . Sexual activity: Not Asked   Other Topics Concern  . None   Social History Narrative  . None   Additional Social History:    Pain Medications: PCP just stopped prescribing her percocet Prescriptions: PCP stopping prescribing her ritlin and wants her to detox from xanex History of alcohol / drug use?: No history of alcohol / drug abuse  Sleep: improving   Appetite:  Good  Current Medications: Current Facility-Administered Medications  Medication Dose Route Frequency Provider Last Rate Last Dose  . acetaminophen (TYLENOL) tablet 650 mg  650 mg Oral Q6H PRN Okonkwo, Justina A, NP   650 mg at 09/01/16 1338  .  alum & mag hydroxide-simeth (MAALOX/MYLANTA) 200-200-20 MG/5ML suspension 30 mL  30 mL Oral Q4H PRN Okonkwo, Justina A, NP      . aspirin EC tablet 81 mg  81 mg Oral Daily Okonkwo, Justina A, NP   81 mg at 09/02/16 0806  . EPINEPHrine (EPI-PEN) injection 0.3 mg  0.3 mg Intramuscular PRN Okonkwo, Justina A, NP      . gabapentin (NEURONTIN) capsule 100 mg  100 mg Oral BID Denzil Magnusonhomas, Emonni Depasquale, NP   100 mg at 09/02/16 0806  . hydrOXYzine (ATARAX/VISTARIL) tablet 50 mg  50 mg Oral Q6H PRN Denzil Magnusonhomas, Audriella Blakeley, NP   50 mg at 09/02/16 0808  . lisinopril (PRINIVIL,ZESTRIL) tablet 20 mg  20 mg Oral  Daily Okonkwo, Justina A, NP   20 mg at 09/02/16 0807  . LORazepam (ATIVAN) tablet 1 mg  1 mg Oral Q6H PRN Nira ConnBerry, Jason A, NP   1 mg at 09/02/16 0454  . magnesium hydroxide (MILK OF MAGNESIA) suspension 30 mL  30 mL Oral Daily PRN Okonkwo, Justina A, NP      . metFORMIN (GLUCOPHAGE) tablet 500 mg  500 mg Oral BID WC Nira ConnBerry, Jason A, NP   500 mg at 08/31/16 2155  . multivitamin with minerals tablet 1 tablet  1 tablet Oral Daily Cobos, Rockey SituFernando A, MD   1 tablet at 09/02/16 0806  . prazosin (MINIPRESS) capsule 1 mg  1 mg Oral QHS Donell SievertSimon, Spencer E, PA-C   1 mg at 08/31/16 2155  . sertraline (ZOLOFT) tablet 100 mg  100 mg Oral Daily Denzil Magnusonhomas, Juergen Hardenbrook, NP   100 mg at 09/02/16 0806  . thiamine (VITAMIN B-1) tablet 100 mg  100 mg Oral Daily Cobos, Rockey SituFernando A, MD   100 mg at 09/02/16 0806  . thyroid (ARMOUR) tablet 90 mg  90 mg Oral q morning - 10a Okonkwo, Justina A, NP   90 mg at 09/01/16 1258  . traZODone (DESYREL) tablet 50 mg  50 mg Oral QHS,MR X 1 Kerry HoughSimon, Spencer E, PA-C   50 mg at 09/01/16 2241    Lab Results:  Results for orders placed or performed during the hospital encounter of 08/27/16 (from the past 48 hour(s))  Glucose, capillary     Status: Abnormal   Collection Time: 08/31/16  5:01 PM  Result Value Ref Range   Glucose-Capillary 130 (H) 65 - 99 mg/dL  Glucose, capillary     Status: Abnormal   Collection Time: 08/31/16  9:49 PM  Result Value Ref Range   Glucose-Capillary 170 (H) 65 - 99 mg/dL  Glucose, capillary     Status: Abnormal   Collection Time: 09/01/16  5:50 AM  Result Value Ref Range   Glucose-Capillary 106 (H) 65 - 99 mg/dL  Glucose, capillary     Status: Abnormal   Collection Time: 09/01/16  4:52 PM  Result Value Ref Range   Glucose-Capillary 132 (H) 65 - 99 mg/dL  Glucose, capillary     Status: None   Collection Time: 09/01/16  8:46 PM  Result Value Ref Range   Glucose-Capillary 98 65 - 99 mg/dL   Comment 1 Notify RN    Comment 2 Document in Chart   Glucose,  capillary     Status: Abnormal   Collection Time: 09/02/16  4:59 AM  Result Value Ref Range   Glucose-Capillary 114 (H) 65 - 99 mg/dL   Comment 1 Notify RN    Comment 2 Document in Chart     Blood Alcohol level:  Lab Results  Component Value Date   ETH <5 08/29/2016   ETH <5 07/26/2014    Metabolic Disorder Labs: Lab Results  Component Value Date   HGBA1C 5.6 08/29/2016   MPG 114 08/29/2016   No results found for: PROLACTIN Lab Results  Component Value Date   CHOL 173 08/29/2016   TRIG 78 08/29/2016   HDL 50 08/29/2016   CHOLHDL 3.5 08/29/2016   VLDL 16 08/29/2016   LDLCALC 107 (H) 08/29/2016    Physical Findings: AIMS: Facial and Oral Movements Muscles of Facial Expression: None, normal Lips and Perioral Area: None, normal Jaw: None, normal Tongue: None, normal,Extremity Movements Upper (arms, wrists, hands, fingers): None, normal Lower (legs, knees, ankles, toes): None, normal, Trunk Movements Neck, shoulders, hips: None, normal, Overall Severity Severity of abnormal movements (highest score from questions above): None, normal Incapacitation due to abnormal movements: None, normal Patient's awareness of abnormal movements (rate only patient's report): No Awareness, Dental Status Current problems with teeth and/or dentures?: No Does patient usually wear dentures?: No  CIWA:  CIWA-Ar Total: 12 COWS:  COWS Total Score: 10  Musculoskeletal: Strength & Muscle Tone: within normal limits Gait & Station: normal Patient leans: N/A  Psychiatric Specialty Exam: Physical Exam  Nursing note and vitals reviewed. Constitutional: She is oriented to person, place, and time. She appears well-developed and well-nourished.  Cardiovascular: Normal rate.   Respiratory: Effort normal.  Neurological: She is alert and oriented to person, place, and time.    Review of Systems  Constitutional: Negative.   HENT: Negative.   Eyes: Negative.   Respiratory: Negative.    Cardiovascular: Negative.   Gastrointestinal: Negative.   Genitourinary: Negative.   Musculoskeletal: Negative.   Skin: Negative.   Neurological: Negative.   Endo/Heme/Allergies: Negative.   Psychiatric/Behavioral: Positive for depression and substance abuse. Negative for hallucinations, memory loss and suicidal ideas. The patient is nervous/anxious. The patient does not have insomnia.   All other systems reviewed and are negative.   Blood pressure 129/73, pulse 80, temperature 97.9 F (36.6 C), temperature source Oral, resp. rate 18, height 5\' 6"  (1.676 m), weight 190 lb (86.2 kg), SpO2 99 %.Body mass index is 30.67 kg/m.  General Appearance: Disheveled  Eye Contact:  Good  Speech:  Clear and Coherent  Volume:  Normal  Mood:  Anxious and Depressed  Affect:  Flat, Tearful and anxious   Thought Process:  Coherent and Descriptions of Associations: Intact  Orientation:  Full (Time, Place, and Person)  Thought Content:  Logical Denies AVH. continues to ruminate about benzo use   Suicidal Thoughts:  No  Homicidal Thoughts:  No  Memory:  Immediate;   Good  Judgement:  Fair  Insight:  Lacking  Psychomotor Activity:  Normal  Concentration:  Concentration: Good  Recall:  Good  Fund of Knowledge:  Fair  Language:  Good  Akathisia:  No  Handed:  Right  AIMS (if indicated):     Assets:  Financial Resources/Insurance  ADL's:  Intact  Cognition:  WNL  Sleep:  Number of Hours: 6     Treatment Plan Summary: Daily contact with patient to assess and evaluate symptoms and progress in treatment, Medication management and Plan is to:   Medication management: Patient shows no improvement to psychiatric condition.. She remains high focused on benzo use. She endorses no improvement in depressive symptoms, anxiety or hopelessness. She has consistently denies SI during her hospital course. She is noted to be less isolative and participating in groups more. To continue  reduce  current symptoms to  base line and improve the patient's overall level of functioning will continue the following treatment plan with  adjustments as noted;    Ativan detox protocol complete, will not reorder once complete.    Continue Vistaril to 50 mg po q6hrs as needed for anxiety. Add Seroquel 25 mg po BID and 50 mg po daily at bedtime for anxiety.  Decrease Zoloft back to 50 mg po daily for depression and anxiety. Patient seems to be tolerating well the 50 mg dose and she endorses increased anxiety after titration to 100.Marland Kitchen   Discontinue Neurontin 100 mg po bid for anxiety with titrations as appropriate.  Discuss adjustment to medications with pharmacists.   Other:  Safety: Will continue 15 minute observation for safety checks. Patient is able to contract for safety on the unit at this time  Labs: No new labs resulted  as of today.   Continue to develop treatment plan to decrease risk of relapse upon discharge and to reduce the need for readmission.  Psycho-social education regarding relapse prevention and self care.  Health care follow up as needed for medical problems.   Continue to attend and participate in therapy.      Denzil Magnuson, NP 09/02/2016, 9:15 AM   Patient ID: Tiffany Cardenas, female   DOB: 1950/04/04, 66 y.o.   MRN: [id number]

## 2016-09-02 NOTE — Progress Notes (Signed)
D    Pt was very anxious and tearful when approaching the writer    She was talking about the increased dose of Zoloft causing her to not feel right and she said she is coming out of her skin  She asked if it could be changed back to 50 MG    A    Verbal support given   Medications administered and effectiveness monitored   Q 15 min checks   Discussed deep breathing exercises  R    Pt became calmer after she received medications

## 2016-09-02 NOTE — [facility] Group Notes (Signed)
Identifying Needs   Date:  09/02/2016  Time:  1300  Type of Therapy:  Nurse Education  Identifying Needs: The group focuses on teaching patients how to identify their needs and then how to develop the skills needed to get them met.   Participation Level:  Minimal  Participation Quality:  Resistant  Affect:  Anxious  Cognitive:  Disorganized  Insight:  Limited  Engagement in Group:  Limited  Modes of Intervention:  Education  Summary of Progress/Problems:  Tiffany Cardenas 09/02/2016, 6:36 PM

## 2016-09-02 NOTE — Progress Notes (Addendum)
D Pt remains emotionally stuck .Marland Kitchen.Marland Kitchen.paralyzed with fear and anxiety and fixated on her somatic feelings. She asks and reasks the same questions about her medicaitons, saying " what time can I take it"" now, what's that for?", " should I take that now?"'do you think that's going to help me?. She completes her daily assessment and on this she wrote she deneid SI today and she rated her depression, hopelessness and anxeity " 11/14/08", respectively. A Pt has had 2 anxiety attacks today- both in the 300 hall and she allowed writer to sit with her and practice deep-breathing. She is started on seroquel 25 mg po bid and 50 mg po QHS per NP oder. Ativan has dc'd and also neurontin. RSafety is in place.  Pt has had 2 doses of seroquel 25 mg today and she says to this writer" I thik it's helping". She cont to be somatically focused, ie frequently Primary school teacherasking writer " do you think I'm going to be ok./...its not going to make me sicker is it? Will it hurt me?"  Pt offered support and encouragement.

## 2016-09-02 NOTE — [facility] Group Notes (Signed)
[facility] LCSW Group Therapy  09/02/2016 10:00 AM  Type of Therapy:  Group Therapy  Participation Level:  Did Not Attend; invited to participate yet did not despite overhead announcement and encouragement by staff  Summary of Progress/Problems: Topic for today was thoughts, feelings and emotions related to what may lie behind anger. Patient's had opportunity to identify with feelings such as: Jealousy, Hurt, Anxiety, Shame, Sadness, Fear, Frustration, Guilt, Disappointment, Worry, and Embarrassment. Group processed discrepancy and difficulty in admitting to underlying feelings. P   Catherine C Harrill, LCSW     

## 2016-09-03 ENCOUNTER — Encounter (HOSPITAL_COMMUNITY): Payer: Self-pay | Admitting: Behavioral Health

## 2016-09-03 LAB — GLUCOSE, CAPILLARY
GLUCOSE-CAPILLARY: 122 mg/dL — AB (ref 65–99)
GLUCOSE-CAPILLARY: 120 mg/dL — AB (ref 65–99)
GLUCOSE-CAPILLARY: 111 mg/dL — AB (ref 65–99)
Glucose-Capillary: 117 mg/dL — ABNORMAL HIGH (ref 65–99)

## 2016-09-03 MED ORDER — HYDROXYZINE HCL 50 MG PO TABS
50.0000 mg | ORAL_TABLET | Freq: Four times a day (QID) | ORAL | Status: DC | PRN
  Administered 2016-09-04 (×2): 50 mg via ORAL
  Filled 2016-09-03: qty 1
  Filled 2016-09-03: qty 10

## 2016-09-03 NOTE — Progress Notes (Signed)
DAR NOTE: Patient presents with anxious affect and mood during assessment.  Denies auditory and visual hallucinations.  Patient verbalizes concern about discharge plan for today.  States she is not ready for discharge due to panic attacks.  MD made aware of concern.  Vistaril 50 mg requested and given with good effect.  Patient encouraged to be visible in dayroom.  Rates depression at 10, hopelessness at 10, and anxiety at 10.  Maintained on routine safety checks.  Medications given as prescribed.  Support and encouragement offered as needed.    States goal for today is "my anxiety and panic attacks."  Remained withdrawn and isolative to her room.  Minimal interaction with staff.

## 2016-09-03 NOTE — Progress Notes (Signed)
[facility] MD Progress Note  09/03/2016 10:47 AM Tiffany Cardenas  MRN:  [id number]   Subjective:  Patient states, " The new medication went well but I am still anxious and still have the racing thoughts. "  Objective: Face to face evaluation completed, case discussed with MD, chart reviewed. During this evaluation, patient is alert and oriented x4. She continues to present with a anxious mood and affect although she endorses slight improvement in anxiety with administration of Seroquel. She endorses to Clinical research associate the Seroquel was well tolerated and she denies side effects at this time. She continues to endorse racing thoughts. She denies SI at this time however, patient reported to MD that she could not contract for safety if she returns home today. Patient is very psychosomatic. She continues to remain extremly focused on benzo use. She is encouraged daily to continue to develop coping skills for anxiety and other mental health symptoms. Patient does endorse some improvement in depression and paronoia. She denies AVH  And does not appear to be internally preoccupied. She endorses improvement in resting pattern and fair appetite. At this time, she is able to contract for safety on the unit only.     Principal Problem: MDD (major depressive disorder) Diagnosis:   Patient Active Problem List   Diagnosis Date Noted  . MDD (major depressive disorder), recurrent, severe, with psychosis (HCC) [F33.3] 08/28/2016    Priority: High  . Withdrawal symptoms, drug or narcotic (HCC) [F19.939] 08/31/2016  . MDD (major depressive disorder) [F32.9] 08/27/2016   Total Time spent with patient: 25 minutes  Past Psychiatric History: See H&P  Past Medical History:  Past Medical History:  Diagnosis Date  . Gout   . Panic attacks   . Thyroid disease     Past Surgical History:  Procedure Laterality Date  . ABDOMINAL HYSTERECTOMY    . CHOLECYSTECTOMY    . TONSILLECTOMY     Family History: History reviewed. No  pertinent family history. Family Psychiatric  History: See H&P Social History:  History  Alcohol Use No     History  Drug Use No    Social History   Social History  . Marital status: Divorced    Spouse name: N/A  . Number of children: N/A  . Years of education: N/A   Social History Main Topics  . Smoking status: Never Smoker  . Smokeless tobacco: Never Used  . Alcohol use No  . Drug use: No  . Sexual activity: Not Asked   Other Topics Concern  . None   Social History Narrative  . None   Additional Social History:    Pain Medications: PCP just stopped prescribing her percocet Prescriptions: PCP stopping prescribing her ritlin and wants her to detox from xanex History of alcohol / drug use?: No history of alcohol / drug abuse  Sleep: improving   Appetite:  Good  Current Medications: Current Facility-Administered Medications  Medication Dose Route Frequency Provider Last Rate Last Dose  . acetaminophen (TYLENOL) tablet 650 mg  650 mg Oral Q6H PRN Okonkwo, Justina A, NP   650 mg at 09/02/16 1854  . alum & mag hydroxide-simeth (MAALOX/MYLANTA) 200-200-20 MG/5ML suspension 30 mL  30 mL Oral Q4H PRN Okonkwo, Justina A, NP      . aspirin EC tablet 81 mg  81 mg Oral Daily Okonkwo, Justina A, NP   81 mg at 09/03/16 0803  . EPINEPHrine (EPI-PEN) injection 0.3 mg  0.3 mg Intramuscular PRN Okonkwo, Justina A, NP      .  hydrOXYzine (ATARAX/VISTARIL) tablet 50 mg  50 mg Oral Q6H PRN Denzil Magnusonhomas, Alesia Oshields, NP   50 mg at 09/03/16 0601  . lisinopril (PRINIVIL,ZESTRIL) tablet 20 mg  20 mg Oral Daily Okonkwo, Justina A, NP   20 mg at 09/03/16 0803  . magnesium hydroxide (MILK OF MAGNESIA) suspension 30 mL  30 mL Oral Daily PRN Okonkwo, Justina A, NP      . metFORMIN (GLUCOPHAGE) tablet 500 mg  500 mg Oral BID WC Nira ConnBerry, Jason A, NP   500 mg at 09/03/16 0802  . multivitamin with minerals tablet 1 tablet  1 tablet Oral Daily Cobos, Rockey SituFernando A, MD   1 tablet at 09/03/16 0803  . prazosin  (MINIPRESS) capsule 1 mg  1 mg Oral QHS Donell SievertSimon, Spencer E, PA-C   1 mg at 09/02/16 2233  . QUEtiapine (SEROQUEL) tablet 25 mg  25 mg Oral BID Denzil Magnusonhomas, Ashley Montminy, NP   25 mg at 09/03/16 0803  . QUEtiapine (SEROQUEL) tablet 50 mg  50 mg Oral QHS Denzil Magnusonhomas, Maryela Tapper, NP   50 mg at 09/02/16 2233  . sertraline (ZOLOFT) tablet 50 mg  50 mg Oral Daily Denzil Magnusonhomas, Vadhir Mcnay, NP   50 mg at 09/03/16 0803  . thiamine (VITAMIN B-1) tablet 100 mg  100 mg Oral Daily Cobos, Rockey SituFernando A, MD   100 mg at 09/03/16 0803  . thyroid (ARMOUR) tablet 90 mg  90 mg Oral q morning - 10a Okonkwo, Justina A, NP   90 mg at 09/03/16 1025  . traZODone (DESYREL) tablet 50 mg  50 mg Oral QHS,MR X 1 Kerry HoughSimon, Spencer E, PA-C   50 mg at 09/02/16 2233    Lab Results:  Results for orders placed or performed during the hospital encounter of 08/27/16 (from the past 48 hour(s))  Glucose, capillary     Status: Abnormal   Collection Time: 09/01/16  4:52 PM  Result Value Ref Range   Glucose-Capillary 132 (H) 65 - 99 mg/dL  Glucose, capillary     Status: None   Collection Time: 09/01/16  8:46 PM  Result Value Ref Range   Glucose-Capillary 98 65 - 99 mg/dL   Comment 1 Notify RN    Comment 2 Document in Chart   Glucose, capillary     Status: Abnormal   Collection Time: 09/02/16  4:59 AM  Result Value Ref Range   Glucose-Capillary 114 (H) 65 - 99 mg/dL   Comment 1 Notify RN    Comment 2 Document in Chart   Glucose, capillary     Status: Abnormal   Collection Time: 09/02/16  4:34 PM  Result Value Ref Range   Glucose-Capillary 134 (H) 65 - 99 mg/dL  Glucose, capillary     Status: Abnormal   Collection Time: 09/02/16  8:51 PM  Result Value Ref Range   Glucose-Capillary 101 (H) 65 - 99 mg/dL   Comment 1 Notify RN    Comment 2 Document in Chart   Glucose, capillary     Status: Abnormal   Collection Time: 09/03/16  5:57 AM  Result Value Ref Range   Glucose-Capillary 117 (H) 65 - 99 mg/dL   Comment 1 Notify RN    Comment 2 Document in Chart      Blood Alcohol level:  Lab Results  Component Value Date   ETH <5 08/29/2016   ETH <5 07/26/2014    Metabolic Disorder Labs: Lab Results  Component Value Date   HGBA1C 5.6 08/29/2016   MPG 114 08/29/2016   No results found  for: PROLACTIN Lab Results  Component Value Date   CHOL 173 08/29/2016   TRIG 78 08/29/2016   HDL 50 08/29/2016   CHOLHDL 3.5 08/29/2016   VLDL 16 08/29/2016   LDLCALC 107 (H) 08/29/2016    Physical Findings: AIMS: Facial and Oral Movements Muscles of Facial Expression: None, normal Lips and Perioral Area: None, normal Jaw: None, normal Tongue: None, normal,Extremity Movements Upper (arms, wrists, hands, fingers): None, normal Lower (legs, knees, ankles, toes): None, normal, Trunk Movements Neck, shoulders, hips: None, normal, Overall Severity Severity of abnormal movements (highest score from questions above): None, normal Incapacitation due to abnormal movements: None, normal Patient's awareness of abnormal movements (rate only patient's report): No Awareness, Dental Status Current problems with teeth and/or dentures?: No Does patient usually wear dentures?: No  CIWA:  CIWA-Ar Total: 3 COWS:  COWS Total Score: 10  Musculoskeletal: Strength & Muscle Tone: within normal limits Gait & Station: normal Patient leans: N/A  Psychiatric Specialty Exam: Physical Exam  Nursing note and vitals reviewed. Constitutional: She is oriented to person, place, and time. She appears well-developed and well-nourished.  Cardiovascular: Normal rate.   Respiratory: Effort normal.  Neurological: She is alert and oriented to person, place, and time.    Review of Systems  Constitutional: Negative.   HENT: Negative.   Eyes: Negative.   Respiratory: Negative.   Cardiovascular: Negative.   Gastrointestinal: Negative.   Genitourinary: Negative.   Musculoskeletal: Negative.   Skin: Negative.   Neurological: Negative.   Endo/Heme/Allergies: Negative.    Psychiatric/Behavioral: Positive for depression and substance abuse. Negative for hallucinations, memory loss and suicidal ideas. The patient is nervous/anxious. The patient does not have insomnia.   All other systems reviewed and are negative.   Blood pressure (!) 112/54, pulse 88, temperature 98.5 F (36.9 C), temperature source Oral, resp. rate 18, height 5\' 6"  (1.676 m), weight 190 lb (86.2 kg), SpO2 99 %.Body mass index is 30.67 kg/m.  General Appearance: Disheveled  Eye Contact:  Good  Speech:  Clear and Coherent  Volume:  Normal  Mood:  Depressed yet improving   Affect:  anxious   Thought Process:  Coherent and Descriptions of Associations: Intact  Orientation:  Full (Time, Place, and Person)  Thought Content:  WDL Denies AVH. continues to ruminate about benzo use   Suicidal Thoughts:  No  Homicidal Thoughts:  No  Memory:  Immediate;   Good  Judgement:  Fair  Insight:  Lacking  Psychomotor Activity:  Normal  Concentration:  Concentration: Good  Recall:  Good  Fund of Knowledge:  Fair  Language:  Good  Akathisia:  No  Handed:  Right  AIMS (if indicated):     Assets:  Financial Resources/Insurance  ADL's:  Intact  Cognition:  WNL  Sleep:  Number of Hours: 5.75     Treatment Plan Summary: Daily contact with patient to assess and evaluate symptoms and progress in treatment, Medication management and Plan is to:   Medication management: Patient shows so improvement in depressed mood however continues to present as very anxious. As per MD, patient endorsed that she could not contract for safety at home if she was to return home. As per MD, discharge disposition discussed with patient and discharge date scheduled for 09/04/2016 if safety is maintained. . To continue reduce current symptoms to base line and improve the patient's overall level of functioning will continue the following treatment plan without  adjustments;   Continue Ativan detox protocol however, will not  reorder once  complete. Increased Vistaril to 50 mg po q6hrs as needed for anxiety.  Discussed this with MD who agreed with plan.    Increase Zoloft to 100 mg po daily for depression and anxiety. Patient seems to be tolerating well.   Continue Neurontin 100 mg po bid for anxiety with titrations as appropriate.   Other:  Safety: Will continue 15 minute observation for safety checks. Patient is able to contract for safety on the unit at this time  Labs: No new labs resulted  as of today.   Continue to develop treatment plan to decrease risk of relapse upon discharge and to reduce the need for readmission.  Psycho-social education regarding relapse prevention and self care.  Health care follow up as needed for medical problems.   Continue to attend and participate in therapy.      Denzil MagnusonLaShunda Olney Monier, NP 09/03/2016, 10:47 AM   Patient ID: Tiffany Cardenas, female   DOB: 28-Mar-1950, 66 y.o.   MRN: [id number]

## 2016-09-03 NOTE — Progress Notes (Signed)
Pt continues to be very anxious and paranoid about her medications and presents many somatic complaints such as having a "warm feeling" when she had the increase in her Zoloft yesterday which was decreased back to 50 mg.  She also described this same feeling when she found out that she was going to receive Seroquel 50 mg along with the scheduled Trazodone 50 mg.  Writer had to spend some time reassuring pt that these meds could be given together and were completely safe at these doses.  Pt's BP was elevated this evening because she was so anxious about her hs meds.  Pt finally took her meds after much coaxing by Clinical research associatewriter.  When writer asked pt what she was afraid of, pt said she was afraid of dying.  Writer reassured pt that the hospital would not give her any medications that would harm her.  Pt denies SI/HI/AVH.  Pt is not having any withdrawal symptoms, but remains very anxious and paranoid.  Support and encouragement offered.  Safety maintained with q15 minute checks.
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
## 2016-09-03 NOTE — Plan of Care (Signed)
Problem: Health Behavior/Discharge Planning: Goal: Compliance with treatment plan for underlying cause of condition will improve Outcome: Progressing Patient is compliant with medication management.

## 2016-09-03 NOTE — Progress Notes (Signed)
Recreation Therapy Notes  Date: 09/03/2016 Time: 9:30am Location: [address] Dayroom  Group Topic: Stress Management  Goal Area(s) Addresses:  Patient will verbalize importance of using healthy stress management.  Patient will identify positive emotions associated with healthy stress management.   Intervention: Stress Management  Activity :  Guided Body Scan. Recreation Therapy Intern introduced the stress management technique of guided body scanning. Recreation Therapy Intern played a YouTube video that allowed patients to mentally scan their body for areas of tensions. Patients were to follow along as video was played to engage in the activity.  Education: Stress Management, Discharge Planning.   Education Outcome: Acknowledges edcuation  Clinical Observations/Feedback: Pt did not attend group.  Dianca Owensby, Recreation Therapy Intern 

## 2016-09-04 ENCOUNTER — Encounter (HOSPITAL_COMMUNITY): Payer: Self-pay | Admitting: Behavioral Health

## 2016-09-04 DIAGNOSIS — F19939 Other psychoactive substance use, unspecified with withdrawal, unspecified: Secondary | ICD-10-CM

## 2016-09-04 DIAGNOSIS — F325 Major depressive disorder, single episode, in full remission: Secondary | ICD-10-CM

## 2016-09-04 LAB — GLUCOSE, CAPILLARY: GLUCOSE-CAPILLARY: 164 mg/dL — AB (ref 65–99)

## 2016-09-04 MED ORDER — QUETIAPINE FUMARATE 25 MG PO TABS: 25.0000 mg | ORAL_TABLET | Freq: Two times a day (BID) | ORAL | 0 refills | Status: DC

## 2016-09-04 MED ORDER — METFORMIN HCL 500 MG PO TABS: 500.0000 mg | ORAL_TABLET | Freq: Two times a day (BID) | ORAL | 0 refills | Status: DC

## 2016-09-04 MED ORDER — HYDROXYZINE HCL 50 MG PO TABS: 50.0000 mg | ORAL_TABLET | Freq: Four times a day (QID) | ORAL | 0 refills | Status: DC | PRN

## 2016-09-04 MED ORDER — PRAZOSIN HCL 1 MG PO CAPS: 1.0000 mg | ORAL_CAPSULE | Freq: Every day | ORAL | 0 refills | Status: DC

## 2016-09-04 MED ORDER — LISINOPRIL 20 MG PO TABS: 20.0000 mg | ORAL_TABLET | Freq: Every day | ORAL | 0 refills | Status: DC

## 2016-09-04 MED ORDER — QUETIAPINE FUMARATE 50 MG PO TABS: 50.0000 mg | ORAL_TABLET | Freq: Every day | ORAL | 0 refills | Status: DC

## 2016-09-04 MED ORDER — THYROID 90 MG PO TABS: 90.0000 mg | ORAL_TABLET | Freq: Every morning | ORAL | 0 refills | Status: DC

## 2016-09-04 MED ORDER — SERTRALINE HCL 50 MG PO TABS: 50.0000 mg | ORAL_TABLET | Freq: Every day | ORAL | 0 refills | Status: DC

## 2016-09-04 MED ORDER — TRAZODONE HCL 50 MG PO TABS: 50.0000 mg | ORAL_TABLET | Freq: Every evening | ORAL | 0 refills | Status: DC | PRN

## 2016-09-04 NOTE — Tx Team (Signed)
Interdisciplinary Treatment and Diagnostic Plan Update  09/04/2016 Time of Session: [id number] Tiffany HeronMargaret D Cardenas MRN: [id number]  Principal Diagnosis: MDD, recurrent, severe  Secondary Diagnoses: Principal Problem:   MDD (major depressive disorder) Active Problems:   Withdrawal symptoms, drug or narcotic (HCC)   Current Medications:  Current Facility-Administered Medications  Medication Dose Route Frequency Provider Last Rate Last Dose  . acetaminophen (TYLENOL) tablet 650 mg  650 mg Oral Q6H PRN Okonkwo, Justina A, NP   650 mg at 09/02/16 1854  . alum & mag hydroxide-simeth (MAALOX/MYLANTA) 200-200-20 MG/5ML suspension 30 mL  30 mL Oral Q4H PRN Okonkwo, Justina A, NP      . aspirin EC tablet 81 mg  81 mg Oral Daily Okonkwo, Justina A, NP   81 mg at 09/04/16 0804  . EPINEPHrine (EPI-PEN) injection 0.3 mg  0.3 mg Intramuscular PRN Okonkwo, Justina A, NP      . hydrOXYzine (ATARAX/VISTARIL) tablet 50 mg  50 mg Oral Q6H PRN Donell SievertSimon, Spencer E, PA-C   50 mg at 09/04/16 0804  . lisinopril (PRINIVIL,ZESTRIL) tablet 20 mg  20 mg Oral Daily Okonkwo, Justina A, NP   20 mg at 09/04/16 0804  . magnesium hydroxide (MILK OF MAGNESIA) suspension 30 mL  30 mL Oral Daily PRN Okonkwo, Justina A, NP      . metFORMIN (GLUCOPHAGE) tablet 500 mg  500 mg Oral BID WC Nira ConnBerry, Jason A, NP   500 mg at 09/04/16 0804  . multivitamin with minerals tablet 1 tablet  1 tablet Oral Daily Cobos, Rockey SituFernando A, MD   1 tablet at 09/04/16 0804  . QUEtiapine (SEROQUEL) tablet 25 mg  25 mg Oral BID Denzil Magnusonhomas, Lashunda, NP   25 mg at 09/04/16 0804  . QUEtiapine (SEROQUEL) tablet 50 mg  50 mg Oral QHS Denzil Magnusonhomas, Lashunda, NP   50 mg at 09/03/16 2147  . sertraline (ZOLOFT) tablet 50 mg  50 mg Oral Daily Denzil Magnusonhomas, Lashunda, NP   50 mg at 09/04/16 0804  . thiamine (VITAMIN B-1) tablet 100 mg  100 mg Oral Daily Cobos, Rockey SituFernando A, MD   100 mg at 09/04/16 0807  . thyroid (ARMOUR) tablet 90 mg  90 mg Oral q morning - 10a Okonkwo, Justina A, NP   90 mg at  09/03/16 1025  . traZODone (DESYREL) tablet 50 mg  50 mg Oral QHS,MR X 1 Kerry HoughSimon, Spencer E, PA-C   50 mg at 09/03/16 2255   PTA Medications: Prescriptions Prior to Admission  Medication Sig Dispense Refill Last Dose  . ALPRAZolam (XANAX) 1 MG tablet Take 1 mg by mouth 2 (two) times daily.   08/26/2016  . aspirin 81 MG chewable tablet Chew 81 mg by mouth daily.   08/27/2016  . Calcium Carb-Cholecalciferol (CALCIUM 600 + D PO) Take 1 tablet by mouth every morning.   08/26/2016  . Cyanocobalamin (VITAMIN B-12 SL) Place 1 tablet under the tongue daily.   Past Week  . EPINEPHrine (EPI-PEN) 0.3 mg/0.3 mL DEVI Inject 0.3 mg into the muscle as needed (For allergic reaction).    years ago  . lisinopril (PRINIVIL,ZESTRIL) 20 MG tablet Take 20 mg by mouth every morning.   08/27/2016  . Lysine 500 MG CAPS Take 500 mg by mouth every morning.   08/25/2016  . metFORMIN (GLUCOPHAGE) 1000 MG tablet Take 500 mg by mouth See admin instructions. Takes half a tablet (500mg ) only if her blood sugar is greater than 140.   08/26/2016  . senna-docusate (SENOKOT-S) 8.6-50 MG tablet Take 2  tablets by mouth as needed for mild constipation.    Past Month  . thyroid (ARMOUR) 60 MG tablet Take 90 mg by mouth every morning.    08/27/2016    Patient Stressors: Medication change or noncompliance  Patient Strengths: Average or above average intelligence Capable of independent living General fund of knowledge Motivation for treatment/growth Supportive family/friends Work skills  Treatment Modalities: Medication Management, Group therapy, Case management,  1 to 1 session with clinician, Psychoeducation, Recreational therapy.   Physician Treatment Plan for Primary Diagnosis:MDD, recurrent, severe  Medication Management: Evaluate patient's response, side effects, and tolerance of medication regimen.  Therapeutic Interventions: 1 to 1 sessions, Unit Group sessions and Medication administration.  Evaluation of Outcomes:  Adequate for discharge   Physician Treatment Plan for Secondary Diagnosis: Principal Problem:   MDD (major depressive disorder) Active Problems:   Withdrawal symptoms, drug or narcotic (HCC)  Long Term Goal(s): Improvement in symptoms so as ready for discharge Improvement in symptoms so as ready for discharge   Short Term Goals: Ability to identify changes in lifestyle to reduce recurrence of condition will improve Ability to verbalize feelings will improve Compliance with prescribed medications will improve Ability to identify triggers associated with substance abuse/mental health issues will improve Ability to disclose and discuss suicidal ideas Ability to identify and develop effective coping behaviors will improve     Medication Management: Evaluate patient's response, side effects, and tolerance of medication regimen.  Therapeutic Interventions: 1 to 1 sessions, Unit Group sessions and Medication administration.  Evaluation of Outcomes: Adequate for discharge    RN Treatment Plan for Primary Diagnosis: MDD, recurrent, severe Long Term Goal(s): Knowledge of disease and therapeutic regimen to maintain health will improve  Short Term Goals: Ability to remain free from injury will improve, Ability to verbalize feelings will improve and Ability to disclose and discuss suicidal ideas  Medication Management: RN will administer medications as ordered by provider, will assess and evaluate patient's response and provide education to patient for prescribed medication. RN will report any adverse and/or side effects to prescribing provider.  Therapeutic Interventions: 1 on 1 counseling sessions, Psychoeducation, Medication administration, Evaluate responses to treatment, Monitor vital signs and CBGs as ordered, Perform/monitor CIWA, COWS, AIMS and Fall Risk screenings as ordered, Perform wound care treatments as ordered.  Evaluation of Outcomes: Adequate for discharge    LCSW Treatment  Plan for Primary Diagnosis: MDD, recurrent, severe Long Term Goal(s): Safe transition to appropriate next level of care at discharge, Engage patient in therapeutic group addressing interpersonal concerns.  Short Term Goals: Engage patient in aftercare planning with referrals and resources, Facilitate patient progression through stages of change regarding substance use diagnoses and concerns and Identify triggers associated with mental health/substance abuse issues  Therapeutic Interventions: Assess for all discharge needs, 1 to 1 time with Social worker, Explore available resources and support systems, Assess for adequacy in community support network, Educate family and significant other(s) on suicide prevention, Complete Psychosocial Assessment, Interpersonal group therapy.  Evaluation of Outcomes: Adequate for discharge    Progress in Treatment: Attending groups:Yes Participating in groups: Yes Taking medication as prescribed: Yes. Toleration medication: Yes. Family/Significant other contact made: Yes, SPE completed with pt's friend and roommate, Loraine Leriche.  Patient understands diagnosis: Yes.  Discussing patient identified problems/goals with staff: Yes. Medical problems stabilized or resolved: Yes. Denies suicidal/homicidal ideation: Yes. Issues/concerns per patient self-inventory: No. Other: n/a   New problem(s) identified: No, Describe:  n/a   New Short Term/Long Term Goal(s): detox, medication management  for mood stabilization; development of comprehensive mental wellness/sobriety plan.   Discharge Plan or Barriers: Follow-up with [facility]-pt insists that her BCBS insurance will take effect in August. In the meantime, pt set up with [facility]. She plans to return home with her roommate.   Reason for Continuation of Hospitalization: none  Estimated Length of Stay: Tuesday, 09/04/16  Attendees: Patient: 09/04/2016 9:10 AM  Physician: Dr. Lucianne MussKumar MD; Dr. Jama Flavorsobos MD; Dr.  Jackquline BerlinIzediuno MD 09/04/2016 9:10 AM  Nursing: Ricard DillonElizabeth RN; Roni RN 09/04/2016 9:10 AM  RN Care Manager: Onnie BoerJennifer Clark CM 09/04/2016 9:10 AM  Social Worker: Chartered loss adjusterHeather Smart, LCSW 09/04/2016 9:10 AM  Recreational Therapist: x 09/04/2016 9:10 AM  Other: Armandina StammerAgnes Nwoko NP; Hillery Jacksanika Lewis NP Feliz Beamravis Money NP 09/04/2016 9:10 AM  Other:  09/04/2016 9:10 AM  Other: 09/04/2016 9:10 AM    Scribe for Treatment Team: Ledell PeoplesHeather N Smart, LCSW 09/04/2016 9:10 AM

## 2016-09-04 NOTE — Progress Notes (Signed)
Pt reports that she is supposed to go home tomorrow.  She states her anxiety is still very high.  She is concerned that her meds are not right.  She also wants to know what time she will be discharged in the morning.  Pt was told that the order has not been written so it will be up to the doctor as to when she is discharged.  Writer also noticed that the Vistaril 50 mg that she has prn was expiring this evening, so then she was anxious about not having anything to take when she gets up in the morning.  Writer assured her that the provider would be informed so the order could be reinstated.  Support and encouragement offered.  Discharge plans are in process.  Safety maintained with q15 minute checks.

## 2016-09-04 NOTE — Progress Notes (Signed)
Pt has been up to the nurse's station numerous times tonight.  She complains of severe anxiety, but denies that she is worried about being discharged.  She was given a Vistaril 50 mg at 0124 after writer spoke to the provider for permission to give the med early, but she was back up to the nurse's station within 30-40 minutes.  She received her scheduled meds along with the repeat dose of Trazodone 50 mg before bedtime.  At one point, pt stated that something was wrong with her and she needed to go the emergency room.  She has worried herself up to the point of being almost frantic. The MHT states that she has been sleeping off and on at times during the night, but will wake suddenly and immediately get up and come to the NS.  Writer has tried to calm the patient by giving reassurance, but she has not be able to go to sleep and stay asleep.  Writer also enlisted the help of the charge nurse who spent some time talking with her.  She is visibly fighting the effects of the medications that she was given to stay awake.  Staff continues to monitor pt q15 minutes for safety.  Will pass to next shift the behaviors of the patient throughout the night.

## 2016-09-04 NOTE — Progress Notes (Signed)
  [facility] :  Will you be returning to the same living situation after discharge:  Yes,  home with roomate At discharge, do you have transportation home?: Yes,  roommate is picking her up at 11am.  Do you have the ability to pay for your medications: Yes,  mental health-pt reports that she will have insurance in Aug.  Release of information consent forms completed and submitted to medical records.   Patient to Follow up at: Follow-up Information    [facility] up on 09/10/2016.   Why:  Evaluation for services at 1:00PM on Monday 8/6 with Pincus Sanesarolyn Rifkin. Please bring insurance card [facility](BCBS) with you to this appt. If you get a different insurance plan, please call the office to find out if this agency can accept. Thank you.  Contact information: [address] [place] [address] [phone]        [facility] Follow up.   Specialty:  Behavioral Health Why:  [facility] to [facility] until your private insurance takes effect.  Contact information: [address] [place] [address] [phone]        [facility] Follow up on 10/10/2016.   Why:  Appt on Wednesday, 10/10/16 at 2:30PM with Garth SchlatterLori Arena for medication management. Thank you.  Contact information: [address] [place] [address] Phone: [phone] Fax: [phone]          Next level of care provider has [facility] to [facility] Link:no  Safety Planning and Suicide Prevention discussed: Yes,  SPE completed with pt's friend/roomate. SPE completed with pt/SPI pamphlet and Mobile crisis information provided.  Have you used any form of tobacco in the last 30 days? (Cigarettes, Smokeless Tobacco, Cigars, and/or Pipes): No  Has patient been referred to the Quitline?: N/A patient is not a smoker  Patient has been referred for addiction treatment: Yes  Pulte HomesHeather N Smart, LCSW  09/04/2016, 9:07 AM
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
## 2016-09-04 NOTE — Progress Notes (Signed)
Patient discharged to lobby. Patient was stable and appreciative at that time. All papers, samples and prescriptions were given and valuables returned. Verbal understanding expressed. Denies SI/HI and A/VH. Patient given opportunity to express concerns and ask questions.  

## 2016-09-05 ENCOUNTER — Encounter (HOSPITAL_COMMUNITY): Payer: Self-pay | Admitting: Emergency Medicine

## 2016-09-05 ENCOUNTER — Emergency Department (HOSPITAL_COMMUNITY)
Admission: EM | Admit: 2016-09-05 | Discharge: 2016-09-05 | Disposition: A | Discharge status: Home / Self Care | Payer: Self-pay | Attending: Emergency Medicine | Admitting: Emergency Medicine

## 2016-09-05 DIAGNOSIS — Z79899 Other long term (current) drug therapy: Secondary | ICD-10-CM | POA: Insufficient documentation

## 2016-09-05 DIAGNOSIS — F410 Panic disorder [episodic paroxysmal anxiety] without agoraphobia: Secondary | ICD-10-CM

## 2016-09-05 DIAGNOSIS — F13930 Sedative, hypnotic or anxiolytic use, unspecified with withdrawal, uncomplicated: Secondary | ICD-10-CM

## 2016-09-05 DIAGNOSIS — Z7982 Long term (current) use of aspirin: Secondary | ICD-10-CM | POA: Insufficient documentation

## 2016-09-05 DIAGNOSIS — F13230 Sedative, hypnotic or anxiolytic dependence with withdrawal, uncomplicated: Secondary | ICD-10-CM | POA: Insufficient documentation

## 2016-09-05 DIAGNOSIS — F419 Anxiety disorder, unspecified: Secondary | ICD-10-CM | POA: Insufficient documentation

## 2016-09-05 MED ORDER — CHLORDIAZEPOXIDE HCL 25 MG PO CAPS: ORAL_CAPSULE | ORAL | 0 refills | Status: DC

## 2016-09-05 MED ORDER — LORAZEPAM 0.5 MG PO TABS
0.5000 mg | ORAL_TABLET | Freq: Once | ORAL | Status: DC
  Filled 2016-09-05: qty 1

## 2016-09-05 MED ORDER — CHLORDIAZEPOXIDE HCL 25 MG PO CAPS
100.0000 mg | ORAL_CAPSULE | Freq: Once | ORAL | Status: DC
  Filled 2016-09-05: qty 4

## 2016-09-05 NOTE — ED Notes (Signed)
Pt refused to take librium and ativan prior to discharge. Pt verbalized understanding of discharge instructions regarding the importance of following up with previous rehabilitation providers.

## 2016-09-05 NOTE — ED Provider Notes (Signed)
[facility]-EMERGENCY DEPT Provider Note   CSN: [id number] Arrival date & time: 09/05/16  0537     History   Chief Complaint Chief Complaint  Patient presents with  . Medication Reaction    HPI Tiffany Cardenas is a 66 y.o. female.  67 yo F with a cc of with a chief complaint of anxiety. This been going on for the past week or so since she stopped taking Xanax. She is on that medication for about 40 years. Went to behavioral health to withdraw from it. She was started on Vistaril. She does not like the way the Vistaril makes her feel. States it makes her heart race and she feels that she has trouble breathing about 15 minutes after taking it. She states this feels a lot like a panic attack but is worse in severity. She feels that her heart is racing.    The history is provided by the patient.  Illness  This is a new problem. The current episode started more than 2 days ago. The problem occurs constantly. The problem has not changed since onset.Pertinent negatives include no chest pain, no headaches and no shortness of breath. Nothing aggravates the symptoms. Nothing relieves the symptoms. She has tried nothing for the symptoms. The treatment provided no relief.    Past Medical History:  Diagnosis Date  . Gout   . Panic attacks   . Thyroid disease     Patient Active Problem List   Diagnosis Date Noted  . Withdrawal symptoms, drug or narcotic (HCC) 08/31/2016  . 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 MDD (major depressive disorder) 08/27/2016    Past Surgical History:  Procedure Laterality Date  . ABDOMINAL HYSTERECTOMY    . CHOLECYSTECTOMY    . TONSILLECTOMY      OB History    No data available       Home Medications    Prior to Admission medications   Medication Sig Start Date End Date Taking? Authorizing Provider  aspirin 81 MG chewable tablet Chew 81 mg by mouth daily.   Yes [provider]  Calcium  Carb-Cholecalciferol (CALCIUM 600 + D PO) Take 1 tablet by mouth every morning.   Yes [provider]  Cyanocobalamin (VITAMIN B-12 SL) Place 1 tablet under the tongue daily.   Yes [provider]  EPINEPHrine (EPI-PEN) 0.3 mg/0.3 mL DEVI Inject 0.3 mg into the muscle as needed (For allergic reaction).    Yes [provider]  hydrOXYzine (ATARAX/VISTARIL) 50 MG tablet Take 1 tablet (50 mg total) by mouth every 6 (six) hours as needed for anxiety (anxiety/agitation or CIWA < or = 10). 09/04/16  Yes Denzil Magnuson, NP  lisinopril (PRINIVIL,ZESTRIL) 20 MG tablet Take 1 tablet (20 mg total) by mouth daily. 09/05/16  Yes Denzil Magnuson, NP  Lysine 500 MG CAPS Take 500 mg by mouth every morning.   Yes [provider]  metFORMIN (GLUCOPHAGE) 500 MG tablet Take 1 tablet (500 mg total) by mouth 2 (two) times daily with a meal. 09/04/16  Yes Denzil Magnuson, NP  QUEtiapine (SEROQUEL) 25 MG tablet Take 1 tablet (25 mg total) by mouth 2 (two) times daily. 09/04/16  Yes Denzil Magnuson, NP  senna-docusate (SENOKOT-S) 8.6-50 MG tablet Take 2 tablets by mouth as needed for mild constipation.  07/28/14  Yes [provider]  sertraline (ZOLOFT) 50 MG tablet Take 1 tablet (50 mg total) by mouth daily. 09/05/16  Yes Denzil Magnuson, NP  thyroid (  ARMOUR) 90 MG tablet Take 1 tablet (90 mg total) by mouth every morning. 09/04/16  Yes Denzil Magnusonhomas, Lashunda, NP  traZODone (DESYREL) 50 MG tablet Take 1 tablet (50 mg total) by mouth at bedtime and may repeat dose one time if needed. Patient taking differently: Take 50 mg by mouth at bedtime as needed. sleep 09/04/16  Yes Denzil Magnusonhomas, Lashunda, NP  chlordiazePOXIDE (LIBRIUM) 25 MG capsule 50mg  PO TID x 1D, then 25-50mg  PO BID X 1D, then 25-50mg  PO QD X 1D 09/05/16   Melene PlanFloyd, Icelynn Onken, DO  QUEtiapine (SEROQUEL) 50 MG tablet Take 1 tablet (50 mg total) by mouth at bedtime. 09/04/16   Denzil Magnusonhomas, Lashunda, NP    Family History History reviewed. No pertinent  family history.  Social History Social History  Substance Use Topics  . Smoking status: Never Smoker  . Smokeless tobacco: Never Used  . Alcohol use No     Allergies   Bee venom; Penicillins; Sulfa antibiotics; Hydrocodone; Ivp dye [iodinated diagnostic agents]; Mango flavor; and Papaya derivatives   Review of Systems Review of Systems  Constitutional: Negative for chills and fever.  HENT: Negative for congestion and rhinorrhea.   Eyes: Negative for redness and visual disturbance.  Respiratory: Negative for shortness of breath and wheezing.   Cardiovascular: Negative for chest pain and palpitations.  Gastrointestinal: Negative for nausea and vomiting.  Genitourinary: Negative for dysuria and urgency.  Musculoskeletal: Negative for arthralgias and myalgias.  Skin: Negative for pallor and wound.  Neurological: Negative for dizziness and headaches.     Physical Exam Updated Vital Signs BP (!) 171/73   Pulse 63   Resp 19   SpO2 98%   Physical Exam  Constitutional: She is oriented to person, place, and time. She appears well-developed and well-nourished. No distress.  HENT:  Head: Normocephalic and atraumatic.  Eyes: Pupils are equal, round, and reactive to light. EOM are normal.  Neck: Normal range of motion. Neck supple.  Cardiovascular: Normal rate and regular rhythm.  Exam reveals no gallop and no friction rub.   No murmur heard. Pulmonary/Chest: Effort normal. She has no wheezes. She has no rales.  Abdominal: Soft. She exhibits no distension and no mass. There is no tenderness. There is no guarding.  Musculoskeletal: She exhibits no edema or tenderness.  Neurological: She is alert and oriented to person, place, and time.  Skin: Skin is warm and dry. She is not diaphoretic.  Psychiatric: She has a normal mood and affect. Her behavior is normal.  Nursing note and vitals reviewed.    ED Treatments / Results  Labs (all labs ordered are listed, but only abnormal  results are displayed) Labs Reviewed - No data to display  EKG  EKG Interpretation None       Radiology No results found.  Procedures Procedures (including critical care time)  Medications Ordered in ED Medications  LORazepam (ATIVAN) tablet 0.5 mg (not administered)  chlordiazePOXIDE (LIBRIUM) capsule 100 mg (not administered)     Initial Impression / Assessment and Plan / ED Course  I have reviewed the triage vital signs and the nursing notes.  Pertinent labs & imaging results that were available during my care of the patient were reviewed by me and considered in my medical decision making (see chart for details).     66 yo F With a chief complaint of anxiety. This is worse and she was taken off of her chronic Xanax. Vistaril was not helping her at home. I will started on a Librium taper. Since  she was discharged from behavioral health yesterday I suggested she call them and asked what they recommend at this point.  8:01 AM:  I have discussed the diagnosis/risks/treatment options with the patient and family and believe the pt to be eligible for discharge home to follow-up with PCP. We also discussed returning to the ED immediately if new or worsening sx occur. We discussed the sx which are most concerning (e.g., sudden worsening pain, fever, inability to tolerate by mouth) that necessitate immediate return. Medications administered to the patient during their visit and any new prescriptions provided to the patient are listed below.  Medications given during this visit Medications  LORazepam (ATIVAN) tablet 0.5 mg (not administered)  chlordiazePOXIDE (LIBRIUM) capsule 100 mg (not administered)     The patient appears reasonably screen and/or stabilized for discharge and I doubt any other medical condition or other [facility] requiring further screening, evaluation, or treatment in the ED at this time prior to discharge.    Final Clinical Impressions(s) / ED Diagnoses   Final  diagnoses:  Anxiety attack  Benzodiazepine withdrawal without complication (HCC)    New Prescriptions New Prescriptions   CHLORDIAZEPOXIDE (LIBRIUM) 25 MG CAPSULE    50mg  PO TID x 1D, then 25-50mg  PO BID X 1D, then 25-50mg  PO QD X 1D     Melene PlanFloyd, Wafaa Deemer, DO 09/05/16 0801

## 2016-09-05 NOTE — ED Notes (Signed)
Bed: WA21 Expected date:  Expected time:  Means of arrival:  Comments: EMS 66 yo female allergic reaction-at [facility] last week-off xanax and using Visteril/symptoms

## 2016-09-05 NOTE — ED Triage Notes (Signed)
Patient BIB [facility] from home c/o reaction to vistaril. Pt c/o feeling jittery, "skin feels tight", and restless .  [facility] she wanted to detox from xanax. Patient started on vistaril, trazodone, and Seroquel. Pt [facility] but not as severe. Once discharged pt continued vistaril but not the other two medications. Symptoms resemble panic/anxiaty attack, however pt states she has experienced them before and this feels very different.

## 2016-09-05 NOTE — Discharge Instructions (Signed)
I would call the rehabilitation doctors today and asked them what they recommend. I would recommend stopping the Vistaril at this point.

## 2016-09-26 ENCOUNTER — Emergency Department (HOSPITAL_COMMUNITY)
Admission: EM | Admit: 2016-09-26 | Discharge: 2016-09-26 | Disposition: A | Discharge status: Home / Self Care | Payer: Self-pay | Attending: Emergency Medicine | Admitting: Emergency Medicine

## 2016-09-26 ENCOUNTER — Encounter (HOSPITAL_COMMUNITY): Payer: Self-pay | Admitting: Emergency Medicine

## 2016-09-26 DIAGNOSIS — F410 Panic disorder [episodic paroxysmal anxiety] without agoraphobia: Secondary | ICD-10-CM | POA: Insufficient documentation

## 2016-09-26 DIAGNOSIS — Z79899 Other long term (current) drug therapy: Secondary | ICD-10-CM | POA: Insufficient documentation

## 2016-09-26 DIAGNOSIS — E119 Type 2 diabetes mellitus without complications: Secondary | ICD-10-CM | POA: Insufficient documentation

## 2016-09-26 DIAGNOSIS — Z7982 Long term (current) use of aspirin: Secondary | ICD-10-CM | POA: Insufficient documentation

## 2016-09-26 DIAGNOSIS — I10 Essential (primary) hypertension: Secondary | ICD-10-CM | POA: Insufficient documentation

## 2016-09-26 HISTORY — DX: Hypothyroidism, unspecified: E03.9

## 2016-09-26 HISTORY — DX: Essential (primary) hypertension: I10

## 2016-09-26 HISTORY — DX: Sciatica, unspecified side: M54.30

## 2016-09-26 HISTORY — DX: Generalized anxiety disorder: F41.1

## 2016-09-26 HISTORY — DX: Type 2 diabetes mellitus without complications: E11.9

## 2016-09-26 LAB — CBC
HEMATOCRIT: 35.3 % — AB (ref 36.0–46.0)
HEMOGLOBIN: 11.9 g/dL — AB (ref 12.0–15.0)
MCH: 32.4 pg (ref 26.0–34.0)
MCHC: 33.7 g/dL (ref 30.0–36.0)
MCV: 96.2 fL (ref 78.0–100.0)
Platelets: 209 10*3/uL (ref 150–400)
RBC: 3.67 MIL/uL — AB (ref 3.87–5.11)
RDW: 12.9 % (ref 11.5–15.5)
WBC: 4.7 10*3/uL (ref 4.0–10.5)

## 2016-09-26 LAB — BASIC METABOLIC PANEL
ANION GAP: 8 (ref 5–15)
BUN: 10 mg/dL (ref 6–20)
CHLORIDE: 106 mmol/L (ref 101–111)
CO2: 25 mmol/L (ref 22–32)
Calcium: 9.3 mg/dL (ref 8.9–10.3)
Creatinine, Ser: 0.83 mg/dL (ref 0.44–1.00)
GFR calc non Af Amer: >60 mL/min (ref >60)
Glucose, Bld: 97 mg/dL (ref 65–99)
POTASSIUM: 3.8 mmol/L (ref 3.5–5.1)
SODIUM: 139 mmol/L (ref 135–145)

## 2016-09-26 LAB — URINALYSIS, ROUTINE W REFLEX MICROSCOPIC
Bilirubin Urine: NEGATIVE
GLUCOSE, UA: NEGATIVE mg/dL
Hgb urine dipstick: NEGATIVE
Ketones, ur: NEGATIVE mg/dL
LEUKOCYTES UA: NEGATIVE
Nitrite: NEGATIVE
PH: 7 (ref 5.0–8.0)
Protein, ur: NEGATIVE mg/dL
Specific Gravity, Urine: 1.003 — ABNORMAL LOW (ref 1.005–1.030)

## 2016-09-26 LAB — CBG MONITORING, ED: Glucose-Capillary: 93 mg/dL (ref 65–99)

## 2016-09-26 MED ORDER — LORAZEPAM 1 MG PO TABS: 1.0000 mg | ORAL_TABLET | Freq: Three times a day (TID) | ORAL | 0 refills | Status: DC | PRN

## 2016-09-26 NOTE — ED Triage Notes (Signed)
Per [facility],  Pt from home. Pt woke up this am on the couch and was having a panic attack. Pt said she stood up and felt like she was going to pass out but was able to get back on the couch. Pt reports some dizziness. Pt was seen at [facility] yesterday and D/C early this morning for sciatic pain. Pt reports she has had high blood pressure the past couple of days, 200/140. BP currently 158/86. Pt alert and oriented, VSS.

## 2016-09-26 NOTE — ED Provider Notes (Signed)
[facility]-EMERGENCY DEPT Provider Note   CSN: [id number] Arrival date & time: 09/26/16  1543     History   Chief Complaint Chief Complaint  Patient presents with  . Near Syncope  . Panic Attack    HPI Tiffany Cardenas is a 66 y.o. female.  She is here for evaluation of "a full-blown panic attack."  This occurred this morning, after she awoke, from her night sleep.  She had gotten to bed very late this morning, around 5 AM, because she was seen and treated at the emergency department in [facility], yesterday evening.  She had been seen there for back pain, with leg discomfort, and diagnosed with sciatica.  She was prescribed Naprosyn.  She states she took her last Xanax 1 mg tablet, yesterday.  She had been taking it twice a day, despite being "weaned off it," during a hospitalization at the [facility], about 3 weeks ago.  She was seen in the ED here, on 09/05/16, claiming to be off Xanax, and prescribed Librium.  She states that she never filled the Librium prescription, but does not know why.  After the withdrawal, at the psychiatric facility, she continued to take Xanax 1 mg twice daily, for the last 3 weeks. She states that her physician who prescribes her Xanax, is unavailable, because she is in [place] for several months.  This physician reportedly resides in [facility], where she practices, and the patient sees her.  The patient denies shortness of breath, chest pain, focal weakness or paresthesia.  She states she feels odd today.  There are no other known modifying factors.    HPI  Past Medical History:  Diagnosis Date  . Diabetes mellitus without complication (HCC)   . GAD (generalized anxiety disorder)   . Gout   . Hypertension   . Hypothyroidism   . Panic attacks   . Sciatica   . Thyroid disease     Patient Active Problem List   Diagnosis Date Noted  . Withdrawal symptoms, drug or narcotic (HCC) 08/31/2016  . MDD (major  depressive disorder), recurrent, severe, with psychosis (HCC) 08/28/2016  . MDD (major depressive disorder) 08/27/2016    Past Surgical History:  Procedure Laterality Date  . ABDOMINAL HYSTERECTOMY    . BACK SURGERY  2016  . CHOLECYSTECTOMY    . TONSILLECTOMY      OB History    No data available       Home Medications    Prior to Admission medications   Medication Sig Start Date End Date Taking? Authorizing Provider  ALPRAZolam Prudy Feeler) 1 MG tablet Take 1 mg by mouth 2 (two) times daily.   Yes [provider]  aspirin 81 MG chewable tablet Chew 81 mg by mouth daily.   Yes [provider]  Calcium Carb-Cholecalciferol (CALCIUM 600 + D PO) Take 1 tablet by mouth every morning.   Yes [provider]  cyclobenzaprine (FLEXERIL) 10 MG tablet Take 10 mg by mouth daily as needed for muscle spasms.   Yes [provider]  lisinopril (PRINIVIL,ZESTRIL) 20 MG tablet Take 1 tablet (20 mg total) by mouth daily. 09/05/16  Yes Denzil Magnuson, NP  Lysine 500 MG CAPS Take 500 mg by mouth every morning.   Yes [provider]  metFORMIN (GLUCOPHAGE) 500 MG tablet Take 1 tablet (500 mg total) by mouth 2 (two) times daily with a meal. 09/04/16  Yes Denzil Magnuson, NP  senna-docusate (SENOKOT-S) 8.6-50 MG tablet Take 2  tablets by mouth as needed for mild constipation.  07/28/14  Yes [provider]  thyroid (ARMOUR) 90 MG tablet Take 1 tablet (90 mg total) by mouth every morning. 09/04/16  Yes Denzil Magnuson, NP  chlordiazePOXIDE (LIBRIUM) 25 MG capsule 50mg  PO TID x 1D, then 25-50mg  PO BID X 1D, then 25-50mg  PO QD X 1D Patient not taking: Reported on 09/26/2016 09/05/16   Melene Plan, DO  hydrOXYzine (ATARAX/VISTARIL) 50 MG tablet Take 1 tablet (50 mg total) by mouth every 6 (six) hours as needed for anxiety (anxiety/agitation or CIWA < or = 10). Patient not taking: Reported on 09/26/2016 09/04/16   Denzil Magnuson, NP  LORazepam (ATIVAN) 1 MG tablet  Take 1 tablet (1 mg total) by mouth 3 (three) times daily as needed for anxiety. 09/26/16   Mancel Bale, MD  QUEtiapine (SEROQUEL) 25 MG tablet Take 1 tablet (25 mg total) by mouth 2 (two) times daily. Patient not taking: Reported on 09/26/2016 09/04/16   Denzil Magnuson, NP  QUEtiapine (SEROQUEL) 50 MG tablet Take 1 tablet (50 mg total) by mouth at bedtime. Patient not taking: Reported on 09/26/2016 09/04/16   Denzil Magnuson, NP  sertraline (ZOLOFT) 50 MG tablet Take 1 tablet (50 mg total) by mouth daily. Patient not taking: Reported on 09/26/2016 09/05/16   Denzil Magnuson, NP  traZODone (DESYREL) 50 MG tablet Take 1 tablet (50 mg total) by mouth at bedtime and may repeat dose one time if needed. Patient not taking: Reported on 09/26/2016 09/04/16   Denzil Magnuson, NP    Family History History reviewed. No pertinent family history.  Social History Social History  Substance Use Topics  . Smoking status: Never Smoker  . Smokeless tobacco: Never Used  . Alcohol use No     Allergies   Hydroxyzine; Penicillins; Sulfa antibiotics; Hydrocodone; Ivp dye [iodinated diagnostic agents]; Mango flavor; and Papaya derivatives   Review of Systems Review of Systems  All other systems reviewed and are negative.    Physical Exam Updated Vital Signs BP (!) 153/88 (BP Location: Right Arm)   Pulse 73   Temp 98.3 F (36.8 C)   Resp 16   Ht 5\' 6"  (1.676 m)   Wt 86.2 kg (190 lb)   SpO2 94%   BMI 30.67 kg/m   Physical Exam  Constitutional: She is oriented to person, place, and time. She appears well-developed and well-nourished. No distress.  HENT:  Head: Normocephalic and atraumatic.  Eyes: Pupils are equal, round, and reactive to light. Conjunctivae and EOM are normal.  Neck: Normal range of motion and phonation normal. Neck supple.  Cardiovascular: Normal rate and regular rhythm.   Pulmonary/Chest: Effort normal and breath sounds normal. She exhibits no tenderness.  Musculoskeletal:  Normal range of motion.  Neurological: She is alert and oriented to person, place, and time. She exhibits normal muscle tone.  No dysarthria, aphasia, or nystagmus.  No pronator drift.  Skin: Skin is warm and dry.  Psychiatric: She has a normal mood and affect. Her behavior is normal. Judgment and thought content normal.  Nursing note and vitals reviewed.    ED Treatments / Results  Labs (all labs ordered are listed, but only abnormal results are displayed) Labs Reviewed  CBC - Abnormal; Notable for the following:       Result Value   RBC 3.67 (*)    Hemoglobin 11.9 (*)    HCT 35.3 (*)    All other components within normal limits  URINALYSIS, ROUTINE W REFLEX MICROSCOPIC -  Abnormal; Notable for the following:    Color, Urine STRAW (*)    Specific Gravity, Urine 1.003 (*)    All other components within normal limits  BASIC METABOLIC PANEL  CBG MONITORING, ED    EKG  EKG Interpretation  Date/Time:  Wednesday September 26 2016 15:55:00 EDT Ventricular Rate:  73 PR Interval:    QRS Duration: 85 QT Interval:  384 QTC Calculation: 424 R Axis:   -28 Text Interpretation:  Sinus rhythm LVH by voltage since last tracing no significant change Confirmed by Mancel Bale [phone]) on 09/26/2016 4:10:21 PM       Radiology No results found.  Procedures Procedures (including critical care time)  Medications Ordered in ED Medications - No data to display   Initial Impression / Assessment and Plan / ED Course  I have reviewed the triage vital signs and the nursing notes.  Pertinent labs & imaging results that were available during my care of the patient were reviewed by me and considered in my medical decision making (see chart for details).  Clinical Course as of Sep 26 1837  Wed Sep 26, 2016  1711 I have queried the [place] controlled substance reporting system, the patient was prescribed alprazolam 1 mg #60 to take twice daily on 09/05/16.  As of today, she should have 16  tablets left.  [EW]    Clinical Course User Index [EW] Mancel Bale, MD     Patient Vitals for the past 24 hrs:  BP Temp Pulse Resp SpO2 Height Weight  09/26/16 1556 (!) 153/88 98.3 F (36.8 C) 73 16 94 % 5\' 6"  (1.676 m) 86.2 kg (190 lb)  09/26/16 1544 - - - - 94 % - -    6:38 PM Reevaluation with update and discussion. After initial assessment and treatment, an updated evaluation reveals the patient remains fairly comfortable.  She now admits to taking her Xanax prescription inappropriately, by using too many, "when I have a panic attack."  Findings discussed with the patient.  I explained that I agreed to give her a very short duration of lorazepam to use to help her symptoms, until such time as she can either refill her Xanax, or work on a another treatment plan, with her PCP, or the doctor of her choice.  She is strongly encouraged to find a local doctor so she does not have to drive and can more readily access the care which she needs.Mancel Bale L      Final Clinical Impressions(s) / ED Diagnoses   Final diagnoses:  Panic attack    Recurrent panic attacks, with inappropriate use of medication.  Patient received a dose of Xanax, while she is at the emergency department in [facility], early this morning.  She has been using her Xanax inappropriately, too many, therefore has run out.  Doubt acute unstable psychiatric condition.  Doubt significant benzodiazepine withdrawal syndrome.  Nursing Notes Reviewed/ Care Coordinated Applicable Imaging Reviewed Interpretation of Laboratory Data incorporated into ED treatment  The patient appears reasonably screened and/or stabilized for discharge and I doubt any other medical condition or other [facility] requiring further screening, evaluation, or treatment in the ED at this time prior to discharge.  Plan: Home Medications-continue usual medicines.; Home Treatments-rest; return here if the recommended treatment, does not  improve the symptoms; Recommended follow up-PCP ASAP.  Consider getting a local physician.    New Prescriptions New Prescriptions   LORAZEPAM (ATIVAN) 1 MG TABLET    Take 1 tablet (1  mg total) by mouth 3 (three) times daily as needed for anxiety.     Mancel Bale, MD 09/26/16 [phone]

## 2016-09-26 NOTE — ED Notes (Signed)
Pt CBG was 93, notified Casey(RN)

## 2016-09-26 NOTE — Discharge Instructions (Signed)
You have run out of your Xanax because you are taking too many of the pills.  Follow-up with your primary care doctor for a refill on her Xanax, if needed.  We are giving you a short-term prescription for Ativan to help control your symptoms, until you can see your doctor.  You should consider finding a primary care doctor in [place], so you do not have to drive to [place], [place].

## 2016-10-26 DIAGNOSIS — R6889 Other general symptoms and signs: Secondary | ICD-10-CM | POA: Insufficient documentation

## 2016-10-26 DIAGNOSIS — Z885 Allergy status to narcotic agent status: Secondary | ICD-10-CM | POA: Insufficient documentation

## 2016-10-26 DIAGNOSIS — R1010 Upper abdominal pain, unspecified: Secondary | ICD-10-CM | POA: Insufficient documentation

## 2016-10-26 DIAGNOSIS — E039 Hypothyroidism, unspecified: Secondary | ICD-10-CM | POA: Insufficient documentation

## 2016-10-26 DIAGNOSIS — E119 Type 2 diabetes mellitus without complications: Secondary | ICD-10-CM | POA: Insufficient documentation

## 2016-10-26 DIAGNOSIS — Z7984 Long term (current) use of oral hypoglycemic drugs: Secondary | ICD-10-CM | POA: Insufficient documentation

## 2016-10-26 DIAGNOSIS — M549 Dorsalgia, unspecified: Secondary | ICD-10-CM | POA: Insufficient documentation

## 2016-10-26 DIAGNOSIS — F419 Anxiety disorder, unspecified: Secondary | ICD-10-CM | POA: Insufficient documentation

## 2016-10-26 DIAGNOSIS — I10 Essential (primary) hypertension: Secondary | ICD-10-CM | POA: Insufficient documentation

## 2016-10-26 DIAGNOSIS — Z880 Allergy status to penicillin: Secondary | ICD-10-CM | POA: Insufficient documentation

## 2016-10-27 ENCOUNTER — Encounter (HOSPITAL_COMMUNITY): Payer: Self-pay | Admitting: Emergency Medicine

## 2016-10-27 ENCOUNTER — Emergency Department (HOSPITAL_COMMUNITY)
Admission: EM | Admit: 2016-10-27 | Discharge: 2016-10-27 | Disposition: A | Discharge status: Home / Self Care | Payer: Self-pay | Attending: Emergency Medicine | Admitting: Emergency Medicine

## 2016-10-27 DIAGNOSIS — R6889 Other general symptoms and signs: Secondary | ICD-10-CM

## 2016-10-27 LAB — COMPREHENSIVE METABOLIC PANEL
ALBUMIN: 4 g/dL (ref 3.5–5.0)
ALK PHOS: 70 U/L (ref 38–126)
ALT: 65 U/L — AB (ref 14–54)
AST: 25 U/L (ref 15–41)
Anion gap: 9 (ref 5–15)
BILIRUBIN TOTAL: 0.8 mg/dL (ref 0.3–1.2)
BUN: 13 mg/dL (ref 6–20)
CALCIUM: 9.3 mg/dL (ref 8.9–10.3)
CO2: 23 mmol/L (ref 22–32)
CREATININE: 0.83 mg/dL (ref 0.44–1.00)
Chloride: 101 mmol/L (ref 101–111)
GFR calc non Af Amer: >60 mL/min (ref >60)
GLUCOSE: 106 mg/dL — AB (ref 65–99)
Potassium: 3.3 mmol/L — ABNORMAL LOW (ref 3.5–5.1)
SODIUM: 133 mmol/L — AB (ref 135–145)
Total Protein: 7 g/dL (ref 6.5–8.1)

## 2016-10-27 LAB — URINALYSIS, ROUTINE W REFLEX MICROSCOPIC
BILIRUBIN URINE: NEGATIVE
Glucose, UA: NEGATIVE mg/dL
HGB URINE DIPSTICK: NEGATIVE
KETONES UR: NEGATIVE mg/dL
Leukocytes, UA: NEGATIVE
Nitrite: NEGATIVE
PROTEIN: NEGATIVE mg/dL
Specific Gravity, Urine: 1.008 (ref 1.005–1.030)
pH: 5 (ref 5.0–8.0)

## 2016-10-27 LAB — CBC
HCT: 35.6 % — ABNORMAL LOW (ref 36.0–46.0)
Hemoglobin: 12.2 g/dL (ref 12.0–15.0)
MCH: 32.7 pg (ref 26.0–34.0)
MCHC: 34.3 g/dL (ref 30.0–36.0)
MCV: 95.4 fL (ref 78.0–100.0)
PLATELETS: 273 10*3/uL (ref 150–400)
RBC: 3.73 MIL/uL — ABNORMAL LOW (ref 3.87–5.11)
RDW: 12.3 % (ref 11.5–15.5)
WBC: 7.8 10*3/uL (ref 4.0–10.5)

## 2016-10-27 LAB — LIPASE, BLOOD: Lipase: 45 U/L (ref 11–51)

## 2016-10-27 NOTE — ED Triage Notes (Signed)
Pt states she was seen at [facility] for a check-up for her back and had lab work completed.  Received a call this afternoon that her liver enzymes were elevated and to go to the ED.  Pt reports "pins and needles" to R hip that radiates down R leg.  Also states pain goes from R hip across upper abd as "electrical movement" and burning pain.  Also reports same pain to L shoulder and neck.  States [facility] told her to make sure when she went to hospital about liver enzymes that she has thyroid levels checked.

## 2016-10-27 NOTE — Discharge Instructions (Signed)
Your labs today looked essentially normal.  Your LFT's almost back to baseline.  Try to avoid tylenol and alcohol as this can cause those levels to increase again. I would follow-up with your primary care doctor. Return here for any new/worsening symptoms.

## 2016-10-27 NOTE — ED Provider Notes (Signed)
[facility] Provider Note   CSN: [id number] Arrival date & time: 10/26/16  2331     History   Chief Complaint Chief Complaint  Patient presents with  . abnormal labs  . Hip Pain  . Abdominal Pain    HPI Tiffany Cardenas is a 66 y.o. female.  The history is provided by the patient and medical records.    66 year old female with history of diabetes, anxiety, gout, hypertension, hypothyroidism, sciatica, presenting to the ED with various complaints.  Patient reports she continues having right-sided back pain. Reports she has been seen at [facility] recently and had an MRI which did not show any acute pathology. She was referred to neurosurgery for follow-up. They started her on a few different medications but states they made her very drowsy and did not seem to help with her symptoms so she stopped taking them. She has not had any progression of her pain, no new numbness or weakness of her arms or legs. No bowel or bladder incontinence.  Patient also complaining of paresthesias on the skin of her abdomen and intermittent hot and cold sensations. States this is been going on for several weeks. Reports this is mostly localized to her right upper abdomen and seems to wave across to the left. There are no alleviating or exacerbating factors. She denies any focal abdominal pain, nausea, vomiting, or diarrhea. She has had prior cholecystectomy as well as hysterectomy.  Lastly, patient is complaining of abnormal labs. States she had lab work drawn a few days ago and was notified today that her liver enzymes were abnormal.  States she was told to come immediately to the ED because of this. States she has been taking Tylenol to help with her back pain. She denies any recent alcohol use.  Past Medical History:  Diagnosis Date  . Diabetes mellitus without complication (HCC)   . GAD (generalized anxiety disorder)   . Gout   . Hypertension   . Hypothyroidism   . Panic attacks   . Sciatica   .  Thyroid disease     Patient Active Problem List   Diagnosis Date Noted  . Withdrawal symptoms, drug or narcotic (HCC) 08/31/2016  . 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 MDD (major depressive disorder) 08/27/2016    Past Surgical History:  Procedure Laterality Date  . ABDOMINAL HYSTERECTOMY    . BACK SURGERY  2016  . CHOLECYSTECTOMY    . TONSILLECTOMY      OB History    No data available       Home Medications    Prior to Admission medications   Medication Sig Start Date End Date Taking? Authorizing Provider  ALPRAZolam Prudy Feeler) 1 MG tablet Take 1 mg by mouth 2 (two) times daily.    [provider]  aspirin 81 MG chewable tablet Chew 81 mg by mouth daily.    [provider]  Calcium Carb-Cholecalciferol (CALCIUM 600 + D PO) Take 1 tablet by mouth every morning.    [provider]  chlordiazePOXIDE (LIBRIUM) 25 MG capsule  PO TID x 1D, then 25-50mg  PO BID X 1D, then 25-50mg  PO QD X 1D Patient not taking: Reported on 09/26/2016 09/05/16   Melene Plan, DO  cyclobenzaprine (FLEXERIL) 10 MG tablet Take 10 mg by mouth daily as needed for muscle spasms.    [provider]  hydrOXYzine (ATARAX/VISTARIL) 50 MG tablet Take 1 tablet (50 mg total) by mouth every 6 (six) hours as  needed for anxiety (anxiety/agitation or CIWA < or = 10). Patient not taking: Reported on 09/26/2016 09/04/16   Denzil Magnuson, NP  lisinopril (PRINIVIL,ZESTRIL) 20 MG tablet Take 1 tablet (20 mg total) by mouth daily. 09/05/16   Denzil Magnuson, NP  LORazepam (ATIVAN) 1 MG tablet Take 1 tablet (1 mg total) by mouth 3 (three) times daily as needed for anxiety. 09/26/16   Mancel Bale, MD  Lysine 500 MG CAPS Take 500 mg by mouth every morning.    [provider]  metFORMIN (GLUCOPHAGE) 500 MG tablet Take 1 tablet (500 mg total) by mouth 2 (two) times daily with a meal. 09/04/16   Denzil Magnuson, NP  QUEtiapine (SEROQUEL)  25 MG tablet Take 1 tablet (25 mg total) by mouth 2 (two) times daily. Patient not taking: Reported on 09/26/2016 09/04/16   Denzil Magnuson, NP  QUEtiapine (SEROQUEL) 50 MG tablet Take 1 tablet (50 mg total) by mouth at bedtime. Patient not taking: Reported on 09/26/2016 09/04/16   Denzil Magnuson, NP  senna-docusate (SENOKOT-S) 8.6-50 MG tablet Take 2 tablets by mouth as needed for mild constipation.  07/28/14   [provider]  sertraline (ZOLOFT) 50 MG tablet Take 1 tablet (50 mg total) by mouth daily. Patient not taking: Reported on 09/26/2016 09/05/16   Denzil Magnuson, NP  thyroid (ARMOUR) 90 MG tablet Take 1 tablet (90 mg total) by mouth every morning. 09/04/16   Denzil Magnuson, NP  traZODone (DESYREL) 50 MG tablet Take 1 tablet (50 mg total) by mouth at bedtime and may repeat dose one time if needed. Patient not taking: Reported on 09/26/2016 09/04/16   Denzil Magnuson, NP    Family History No family history on file.  Social History Social History  Substance Use Topics  . Smoking status: Never Smoker  . Smokeless tobacco: Never Used  . Alcohol use No     Allergies   Hydroxyzine; Penicillins; Sulfa antibiotics; Hydrocodone; Ivp dye [iodinated diagnostic agents]; Mango flavor; and Papaya derivatives   Review of Systems Review of Systems  Gastrointestinal:       Paresthesias of skin  Musculoskeletal: Positive for back pain.  All other systems reviewed and are negative.    Physical Exam Updated Vital Signs BP (!) 165/116   Pulse 74   Temp 98.4 F (36.9 C) (Oral)   Resp 18   Ht  (1.651 m)   Wt 86.2 kg (190 lb)   SpO2 99%   BMI 31.62 kg/m   Physical Exam  Constitutional: She is oriented to person, place, and time. She appears well-developed and well-nourished.  HENT:  Head: Normocephalic and atraumatic.  Mouth/Throat: Oropharynx is clear and moist.  Eyes: Pupils are equal, round, and reactive to light. Conjunctivae and EOM are normal.  Neck: Normal  range of motion.  Cardiovascular: Normal rate, regular rhythm and normal heart sounds.   Pulmonary/Chest: Effort normal and breath sounds normal. No respiratory distress. She has no wheezes.  Abdominal: Soft. Bowel sounds are normal. There is no tenderness. There is no rebound.  Abdomen soft, nontender, no peritoneal signs, bowel sounds are normal, no distention  Musculoskeletal: Normal range of motion.  No noted deformity of the spine, normal strength and sensation of both legs, no saddle anesthesia, normal gait  Neurological: She is alert and oriented to person, place, and time.  Skin: Skin is warm and dry.  No rashes noted  Psychiatric: Her mood appears anxious.  Anxious appearing, ruminating on the same 2-3 statements multiple times during exam  Nursing note and vitals reviewed.    ED Treatments / Results  Labs (all labs ordered are listed, but only abnormal results are displayed) Labs Reviewed  COMPREHENSIVE METABOLIC PANEL - Abnormal; Notable for the following:       Result Value   Sodium 133 (*)    Potassium 3.3 (*)    Glucose, Bld 106 (*)    ALT 65 (*)    All other components within normal limits  CBC - Abnormal; Notable for the following:    RBC 3.73 (*)    HCT 35.6 (*)    All other components within normal limits  LIPASE, BLOOD  URINALYSIS, ROUTINE W REFLEX MICROSCOPIC    EKG  EKG Interpretation None       Radiology No results found.  Procedures Procedures (including critical care time)  Medications Ordered in ED Medications - No data to display   Initial Impression / Assessment and Plan / ED Course  I have reviewed the triage vital signs and the nursing notes.  Pertinent labs & imaging results that were available during my care of the patient were reviewed by me and considered in my medical decision making (see chart for details).  66 year old female here with multitude of complaints, some of which are very atypical.  1.  Right lower back pain.   Seems like a lumbar radiculopathy with radiation into the leg.  No focal neurologic deficits here.  Had MRI on 10/16/16 at [facility] which showed small disc bulge at L3-L4. No canal narrowing. She was evaluated by neurosurgery, no emergent intervention. They are following in clinic. Given no acute change today, do not feel repeat imaging or further work-up indicated at this time.  Can continue to follow-up with neurosurgery.  2.  Paresthesias of right upper abdomen.  Uncertain etiology of this.  Seems to have been ongoing for the past few weeks based on notes from [place].  Benign abdominal exam here.  Has had prior cholecystectomy.  Doubt acute/surgical abdominal pathology at this time.  Follow-up with PCP.  3.  Abnormal labs.  LFT's were elevated at [facility] 3 days ago.  Was taking tylenol for pain.  Repeat labs today essentially normally, AST minimally elevated at 65.  No focal abdominal pain.  Not on a statin.  Will have her hold tylenol, refrain from alcohol use.  Follow-up with PCP to continue to monitor.  After evaluation here in the ED, I did not see any emergent pathology that would warrant hospitalization or further workup on an emergent basis. I recommended that she follow-up closely with her specialists at [facility] as well as her primary care doctor if she should have ongoing symptoms. She understands to return to the ED for any new or worsening symptoms.  Final Clinical Impressions(s) / ED Diagnoses   Final diagnoses:  Multiple complaints    New Prescriptions Discharge Medication List as of 10/27/2016  4:56 AM       Garlon Hatchet, PA-C 10/27/16 0608    Ward, Layla Maw, DO 10/27/16 [id number]

## 2016-11-11 ENCOUNTER — Encounter (HOSPITAL_COMMUNITY): Payer: Self-pay | Admitting: Emergency Medicine

## 2016-11-11 ENCOUNTER — Emergency Department (HOSPITAL_COMMUNITY)
Admission: EM | Admit: 2016-11-11 | Discharge: 2016-11-11 | Disposition: A | Discharge status: Home / Self Care | Payer: Self-pay | Attending: Physician Assistant | Admitting: Physician Assistant

## 2016-11-11 DIAGNOSIS — E039 Hypothyroidism, unspecified: Secondary | ICD-10-CM | POA: Insufficient documentation

## 2016-11-11 DIAGNOSIS — Z7984 Long term (current) use of oral hypoglycemic drugs: Secondary | ICD-10-CM | POA: Insufficient documentation

## 2016-11-11 DIAGNOSIS — Z7982 Long term (current) use of aspirin: Secondary | ICD-10-CM | POA: Insufficient documentation

## 2016-11-11 DIAGNOSIS — E119 Type 2 diabetes mellitus without complications: Secondary | ICD-10-CM | POA: Insufficient documentation

## 2016-11-11 DIAGNOSIS — I10 Essential (primary) hypertension: Secondary | ICD-10-CM | POA: Insufficient documentation

## 2016-11-11 DIAGNOSIS — Z79899 Other long term (current) drug therapy: Secondary | ICD-10-CM | POA: Insufficient documentation

## 2016-11-11 DIAGNOSIS — R252 Cramp and spasm: Secondary | ICD-10-CM | POA: Insufficient documentation

## 2016-11-11 LAB — COMPREHENSIVE METABOLIC PANEL
ALT: 20 U/L (ref 14–54)
AST: 23 U/L (ref 15–41)
Albumin: 3.8 g/dL (ref 3.5–5.0)
Alkaline Phosphatase: 74 U/L (ref 38–126)
Anion gap: 9 (ref 5–15)
BUN: 11 mg/dL (ref 6–20)
CALCIUM: 9.2 mg/dL (ref 8.9–10.3)
CHLORIDE: 100 mmol/L — AB (ref 101–111)
CO2: 25 mmol/L (ref 22–32)
CREATININE: 0.75 mg/dL (ref 0.44–1.00)
GFR calc Af Amer: >60 mL/min (ref >60)
GFR calc non Af Amer: >60 mL/min (ref >60)
Glucose, Bld: 157 mg/dL — ABNORMAL HIGH (ref 65–99)
POTASSIUM: 4 mmol/L (ref 3.5–5.1)
SODIUM: 134 mmol/L — AB (ref 135–145)
TOTAL PROTEIN: 6.7 g/dL (ref 6.5–8.1)
Total Bilirubin: 0.6 mg/dL (ref 0.3–1.2)

## 2016-11-11 NOTE — Discharge Instructions (Signed)
Your Potassium is normal today.

## 2016-11-11 NOTE — ED Provider Notes (Signed)
[facility] Provider Note   CSN: [id number] Arrival date & time: 11/11/16  1539     History   Chief Complaint Chief Complaint  Patient presents with  . Pottassium level check  . Numbness    HPI Tiffany Cardenas is a 66 y.o. female.  HPI   Pt is a 66 yo female presenting for K checke today. Patient reported that she has a lot of symptoms that no doctors been able to figure out. Patient has a history of generalized anxiety disorder, gout hypertension hypothyroidism. Patient seen multiple physicians at [facility] and has a neurologist that she follows with for her feelings of numbness, occasional pinprick sensation and muscle tightness. She reports that her neurologist that he forgot to "check her potassium" On Friday so she came here. Evaluated about her potassium.  Past Medical History:  Diagnosis Date  . Diabetes mellitus without complication (HCC)   . GAD (generalized anxiety disorder)   . Gout   . Hypertension   . Hypothyroidism   . Panic attacks   . Sciatica   . Thyroid disease     Patient Active Problem List   Diagnosis Date Noted  . Withdrawal symptoms, drug or narcotic (HCC) 08/31/2016  . 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 MDD (major depressive disorder) 08/27/2016    Past Surgical History:  Procedure Laterality Date  . ABDOMINAL HYSTERECTOMY    . BACK SURGERY  2016  . CHOLECYSTECTOMY    . TONSILLECTOMY      OB History    No data available       Home Medications    Prior to Admission medications   Medication Sig Start Date End Date Taking? Authorizing Provider  aspirin 81 MG chewable tablet Chew 81 mg by mouth daily.   Yes [provider]  Calcium Carb-Cholecalciferol (CALCIUM 600 + D PO) Take 1 tablet by mouth every morning.   Yes [provider]  lisinopril (PRINIVIL,ZESTRIL) 20 MG tablet Take 1 tablet (20 mg total) by mouth daily. 09/05/16  Yes Denzil Magnuson, NP  LORazepam  (ATIVAN) 1 MG tablet Take 1 tablet (1 mg total) by mouth 3 (three) times daily as needed for anxiety. 09/26/16  Yes Mancel Bale, MD  Lysine 500 MG CAPS Take 500 mg by mouth every morning.   Yes [provider]  metFORMIN (GLUCOPHAGE) 500 MG tablet Take 1 tablet (500 mg total) by mouth 2 (two) times daily with a meal. Patient taking differently: Take 500 mg by mouth daily with breakfast.  09/04/16  Yes Denzil Magnuson, NP  thyroid (ARMOUR) 90 MG tablet Take 1 tablet (90 mg total) by mouth every morning. 09/04/16  Yes Denzil Magnuson, NP  chlordiazePOXIDE (LIBRIUM) 25 MG capsule  PO TID x 1D, then 25-50mg  PO BID X 1D, then 25-50mg  PO QD X 1D Patient not taking: Reported on 09/26/2016 09/05/16   Melene Plan, DO  hydrOXYzine (ATARAX/VISTARIL) 50 MG tablet Take 1 tablet (50 mg total) by mouth every 6 (six) hours as needed for anxiety (anxiety/agitation or CIWA < or = 10). Patient not taking: Reported on 09/26/2016 09/04/16   Denzil Magnuson, NP  QUEtiapine (SEROQUEL) 50 MG tablet Take 1 tablet (50 mg total) by mouth at bedtime. Patient not taking: Reported on 09/26/2016 09/04/16   Denzil Magnuson, NP  sertraline (ZOLOFT) 50 MG tablet Take 1 tablet (50 mg total) by mouth daily. Patient not taking: Reported on 09/26/2016 09/05/16   Denzil Magnuson, NP  traZODone (DESYREL) 50  MG tablet Take 1 tablet (50 mg total) by mouth at bedtime and may repeat dose one time if needed. Patient not taking: Reported on 09/26/2016 09/04/16   Denzil Magnuson, NP    Family History No family history on file.  Social History Social History  Substance Use Topics  . Smoking status: Never Smoker  . Smokeless tobacco: Never Used  . Alcohol use No     Allergies   Hydroxyzine; Penicillins; Sulfa antibiotics; Cymbalta [duloxetine hcl]; Hydrocodone; Ivp dye [iodinated diagnostic agents]; Chymopapain; Mango flavor; and Papaya derivatives   Review of Systems Review of Systems  Constitutional: Negative for activity  change.  Cardiovascular: Negative for chest pain.  Neurological: Positive for numbness.     Physical Exam Updated Vital Signs BP (!) 159/87   Pulse 88   Temp 98.2 F (36.8 C) (Oral)   Resp 18   SpO2 98%   Physical Exam  Constitutional: She is oriented to person, place, and time. She appears well-developed and well-nourished.  HENT:  Head: Normocephalic and atraumatic.  Eyes: Right eye exhibits no discharge.  Cardiovascular: Normal rate and regular rhythm.   Pulmonary/Chest: Effort normal and breath sounds normal.  Abdominal: She exhibits no distension. There is no tenderness.  Neurological: She is oriented to person, place, and time.  Skin: Skin is warm and dry. She is not diaphoretic.  Psychiatric: She has a normal mood and affect.  Nursing note and vitals reviewed.    ED Treatments / Results  Labs (all labs ordered are listed, but only abnormal results are displayed) Labs Reviewed  COMPREHENSIVE METABOLIC PANEL - Abnormal; Notable for the following:       Result Value   Sodium 134 (*)    Chloride 100 (*)    Glucose, Bld 157 (*)    All other components within normal limits    EKG  EKG Interpretation None       Radiology No results found.  Procedures Procedures (including critical care time)  Medications Ordered in ED Medications - No data to display   Initial Impression / Assessment and Plan / ED Course  I have reviewed the triage vital signs and the nursing notes.  Pertinent labs & imaging results that were available during my care of the patient were reviewed by me and considered in my medical decision making (see chart for details).    Pt is a 66 yo female presenting for K checke today. Patient reported that she has a lot of symptoms that no doctors been able to figure out. Patient has a history of generalized anxiety disorder, gout hypertension hypothyroidism. Patient seen multiple physicians at [facility] and has a neurologist that she follows with for  her feelings of numbness, occasional pinprick sensation and muscle tightness. She reports that her neurologist that he forgot to "check her potassium" On Friday so she came here. Evaluated about her potassium.  Her K is normal. Will discharge.    Final Clinical Impressions(s) / ED Diagnoses   Final diagnoses:  None    New Prescriptions New Prescriptions   No medications on file     Abelino Derrick, MD 11/11/16 1738

## 2016-11-11 NOTE — ED Triage Notes (Addendum)
Pt states MD called and told her to come to ER for potassium level check. Pt states 3 weeks of right sided arm, leg numbness and tingling. States she had an MRI done 1 week ago and was cleared for a stroke. Pt very insistent that she only have potassium drawn and know her result so she can leave. *I can see on her chart where she was here recently for abnormal LFTS, was told to come here and have those rechecked. K 3.3 noted from last visit 9/22.  Denies chest pain, any other symptoms or complaints. Pt has asked about 5 times "can yall just tell me my results up here so I can leave?" informed of ED process.

## 2016-11-15 ENCOUNTER — Other Ambulatory Visit: Payer: Self-pay

## 2016-11-15 ENCOUNTER — Encounter (HOSPITAL_COMMUNITY): Payer: Self-pay

## 2016-11-15 ENCOUNTER — Emergency Department (HOSPITAL_COMMUNITY): Payer: Self-pay

## 2016-11-15 ENCOUNTER — Emergency Department (HOSPITAL_COMMUNITY)
Admission: EM | Admit: 2016-11-15 | Discharge: 2016-11-15 | Disposition: A | Discharge status: Home / Self Care | Payer: Self-pay | Attending: Emergency Medicine | Admitting: Emergency Medicine

## 2016-11-15 DIAGNOSIS — I10 Essential (primary) hypertension: Secondary | ICD-10-CM | POA: Insufficient documentation

## 2016-11-15 DIAGNOSIS — E039 Hypothyroidism, unspecified: Secondary | ICD-10-CM | POA: Insufficient documentation

## 2016-11-15 DIAGNOSIS — M62838 Other muscle spasm: Secondary | ICD-10-CM | POA: Insufficient documentation

## 2016-11-15 DIAGNOSIS — Z79899 Other long term (current) drug therapy: Secondary | ICD-10-CM | POA: Insufficient documentation

## 2016-11-15 DIAGNOSIS — Z7982 Long term (current) use of aspirin: Secondary | ICD-10-CM | POA: Insufficient documentation

## 2016-11-15 DIAGNOSIS — E119 Type 2 diabetes mellitus without complications: Secondary | ICD-10-CM | POA: Insufficient documentation

## 2016-11-15 DIAGNOSIS — Z7984 Long term (current) use of oral hypoglycemic drugs: Secondary | ICD-10-CM | POA: Insufficient documentation

## 2016-11-15 LAB — I-STAT TROPONIN, ED: Troponin i, poc: 0 ng/mL (ref 0.00–0.08)

## 2016-11-15 LAB — CBC
HCT: 36.2 % (ref 36.0–46.0)
HEMOGLOBIN: 12.2 g/dL (ref 12.0–15.0)
MCH: 32.1 pg (ref 26.0–34.0)
MCHC: 33.7 g/dL (ref 30.0–36.0)
MCV: 95.3 fL (ref 78.0–100.0)
PLATELETS: 241 10*3/uL (ref 150–400)
RBC: 3.8 MIL/uL — AB (ref 3.87–5.11)
RDW: 12.2 % (ref 11.5–15.5)
WBC: 5.5 10*3/uL (ref 4.0–10.5)

## 2016-11-15 LAB — BASIC METABOLIC PANEL
ANION GAP: 7 (ref 5–15)
BUN: 10 mg/dL (ref 6–20)
CHLORIDE: 102 mmol/L (ref 101–111)
CO2: 24 mmol/L (ref 22–32)
CREATININE: 1.03 mg/dL — AB (ref 0.44–1.00)
Calcium: 9.3 mg/dL (ref 8.9–10.3)
GFR calc non Af Amer: 57 mL/min — ABNORMAL LOW (ref >60)
Glucose, Bld: 171 mg/dL — ABNORMAL HIGH (ref 65–99)
POTASSIUM: 4.2 mmol/L (ref 3.5–5.1)
SODIUM: 133 mmol/L — AB (ref 135–145)

## 2016-11-15 LAB — CBG MONITORING, ED: GLUCOSE-CAPILLARY: 92 mg/dL (ref 65–99)

## 2016-11-15 MED ORDER — DIAZEPAM 5 MG PO TABS: 5.0000 mg | ORAL_TABLET | Freq: Two times a day (BID) | ORAL | 0 refills | Status: DC | PRN

## 2016-11-15 MED ORDER — DIAZEPAM 5 MG PO TABS
5.0000 mg | ORAL_TABLET | Freq: Once | ORAL | Status: AC
  Administered 2016-11-15: 5 mg via ORAL
  Filled 2016-11-15: qty 1

## 2016-11-15 NOTE — ED Triage Notes (Signed)
Pt states she has had "contractions" on the right side of her body for a while, worse over the last 3-4 days. Some pain the abdomen as well as in the chest. Pt recently seen here for decreased potassium. Pt tearful in triage. Appears anxious. Some spasms noted on the right side.

## 2016-11-15 NOTE — ED Notes (Signed)
Pt verbalized understanding of d/c instructions and has no further questions. Pt wheeled to main waiting room on 2nd floor to wait for her ride. VSS, NAD.

## 2016-11-15 NOTE — Discharge Instructions (Signed)
Continue your current meds.   Take valium as needed for spasms.   See [place] neuro or your neurologist for further evaluation   Return to ER if you have worse spasms, weakness, numbness

## 2016-11-15 NOTE — ED Provider Notes (Signed)
[facility]-EMERGENCY DEPT Provider Note   CSN: [id number] Arrival date & time: 11/15/16  1337     History   Chief Complaint Chief Complaint  Patient presents with  . Spasms    HPI Tiffany Cardenas is a 66 y.o. female history of diabetes, anxiety, hypertension, hypothyroidism here presenting with right arm spasms. Patient had right arm and leg spasms intermittently going on for the last several months. Patient actually had extensive evaluation including MRI of the brain, entire spine done at [facility] about 2 weeks ago that was unremarkable. Patient initially saw neurology there as well. Patient that her potassium was low and came in several days ago and potassium was normal. She had persistent intermittent right sided spasms for the last several days. Patient's neighbor came and visited her and brought her to the ED for further evaluation. Patient never had a nerve conduction study or EMG.     The history is provided by the patient.    Past Medical History:  Diagnosis Date  . Diabetes mellitus without complication (HCC)   . GAD (generalized anxiety disorder)   . Gout   . Hypertension   . Hypothyroidism   . Panic attacks   . Sciatica   . Thyroid disease     Patient Active Problem List   Diagnosis Date Noted  . Withdrawal symptoms, drug or narcotic (HCC) 08/31/2016  . 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 MDD (major depressive disorder) 08/27/2016    Past Surgical History:  Procedure Laterality Date  . ABDOMINAL HYSTERECTOMY    . BACK SURGERY  2016  . CHOLECYSTECTOMY    . TONSILLECTOMY      OB History    No data available       Home Medications    Prior to Admission medications   Medication Sig Start Date End Date Taking? Authorizing Provider  aspirin 81 MG chewable tablet Chew 81 mg by mouth daily.    [provider]  Calcium Carb-Cholecalciferol (CALCIUM 600 + D PO) Take 1 tablet by mouth every morning.     [provider]  chlordiazePOXIDE (LIBRIUM) 25 MG capsule  PO TID x 1D, then 25-50mg  PO BID X 1D, then 25-50mg  PO QD X 1D Patient not taking: Reported on 09/26/2016 09/05/16   Melene Plan, DO  hydrOXYzine (ATARAX/VISTARIL) 50 MG tablet Take 1 tablet (50 mg total) by mouth every 6 (six) hours as needed for anxiety (anxiety/agitation or CIWA < or = 10). Patient not taking: Reported on 09/26/2016 09/04/16   Denzil Magnuson, NP  lisinopril (PRINIVIL,ZESTRIL) 20 MG tablet Take 1 tablet (20 mg total) by mouth daily. 09/05/16   Denzil Magnuson, NP  LORazepam (ATIVAN) 1 MG tablet Take 1 tablet (1 mg total) by mouth 3 (three) times daily as needed for anxiety. 09/26/16   Mancel Bale, MD  Lysine 500 MG CAPS Take 500 mg by mouth every morning.    [provider]  metFORMIN (GLUCOPHAGE) 500 MG tablet Take 1 tablet (500 mg total) by mouth 2 (two) times daily with a meal. Patient taking differently: Take 500 mg by mouth daily with breakfast.  09/04/16   Denzil Magnuson, NP  QUEtiapine (SEROQUEL) 50 MG tablet Take 1 tablet (50 mg total) by mouth at bedtime. Patient not taking: Reported on 09/26/2016 09/04/16   Denzil Magnuson, NP  sertraline (ZOLOFT) 50 MG tablet Take 1 tablet (50 mg total) by mouth daily. Patient not taking: Reported on 09/26/2016 09/05/16   Denzil Magnuson, NP  thyroid (ARMOUR) 90 MG tablet Take 1 tablet (90 mg total) by mouth every morning. 09/04/16   Denzil Magnuson, NP  traZODone (DESYREL) 50 MG tablet Take 1 tablet (50 mg total) by mouth at bedtime and may repeat dose one time if needed. Patient not taking: Reported on 09/26/2016 09/04/16   Denzil Magnuson, NP    Family History History reviewed. No pertinent family history.  Social History Social History  Substance Use Topics  . Smoking status: Never Smoker  . Smokeless tobacco: Never Used  . Alcohol use No     Allergies   Hydroxyzine; Penicillins; Sulfa antibiotics; Cymbalta [duloxetine hcl]; Hydrocodone; Ivp  dye [iodinated diagnostic agents]; Chymopapain; Mango flavor; and Papaya derivatives   Review of Systems Review of Systems  Neurological:       Spasms   All other systems reviewed and are negative.    Physical Exam Updated Vital Signs BP (!) 153/90   Pulse 76   Temp 97.8 F (36.6 C) (Oral)   Resp 15   SpO2 98%   Physical Exam  Constitutional: She is oriented to person, place, and time. She appears well-developed.  HENT:  Head: Normocephalic.  Mouth/Throat: Oropharynx is clear and moist.  Eyes: Pupils are equal, round, and reactive to light. Conjunctivae and EOM are normal.  Neck: Normal range of motion. Neck supple.  Cardiovascular: Normal rate, regular rhythm and normal heart sounds.   Pulmonary/Chest: Effort normal and breath sounds normal. No respiratory distress. She has no wheezes. She has no rales.  Abdominal: Soft. Bowel sounds are normal. She exhibits no distension. There is no tenderness. There is no guarding.  Musculoskeletal: Normal range of motion. She exhibits no edema.  Neurological: She is alert and oriented to person, place, and time.  CN 2-12 intact. Nl strength and sensation throughout. Occasional spasms but no seizure like activity. No cogwheeling   Skin: Skin is warm.  Psychiatric: She has a normal mood and affect.  Nursing note and vitals reviewed.    ED Treatments / Results  Labs (all labs ordered are listed, but only abnormal results are displayed) Labs Reviewed  BASIC METABOLIC PANEL - Abnormal; Notable for the following:       Result Value   Sodium 133 (*)    Glucose, Bld 171 (*)    Creatinine, Ser 1.03 (*)    GFR calc non Af Amer 57 (*)    All other components within normal limits  CBC - Abnormal; Notable for the following:    RBC 3.80 (*)    All other components within normal limits  I-STAT TROPONIN, ED    EKG  EKG Interpretation  Date/Time:  Thursday November 15 2016 13:45:47 EDT Ventricular Rate:  88 PR Interval:  138 QRS  Duration: 82 QT Interval:  354 QTC Calculation: 428 R Axis:   -48 Text Interpretation:  Sinus rhythm with frequent Premature ventricular complexes Left anterior fascicular block Abnormal ECG No significant change since last tracing Confirmed by Richardean Canal [phone]) on 11/15/2016 4:07:40 PM       Radiology Dg Chest 2 View  Result Date: 11/15/2016 CLINICAL DATA:  Chest pain. EXAM: CHEST  2 VIEW COMPARISON:  Chest x-ray dated March 28, 2014. FINDINGS: The cardiomediastinal silhouette is normal in size. Normal pulmonary vascularity. No focal consolidation, pleural effusion, or pneumothorax. Unchanged calcified granuloma in the left peripheral lung. No acute osseous abnormality. IMPRESSION: No active cardiopulmonary disease. Electronically Signed   By: Obie Dredge M.D.   On: 11/15/2016 14:19  Procedures Procedures (including critical care time)  Medications Ordered in ED Medications  diazepam (VALIUM) tablet 5 mg (not administered)     Initial Impression / Assessment and Plan / ED Course  I have reviewed the triage vital signs and the nursing notes.  Pertinent labs & imaging results that were available during my care of the patient were reviewed by me and considered in my medical decision making (see chart for details).     Tiffany Cardenas is a 66 y.o. female here with R sided muscle spasms and muscle twitching. Has seen [facility] neurology before. Had MRI brain and spine that was normal. Had multiple labs that were normal. I consider partial seizures vs muscle spasms. Consider outpatient EEG and EMG or nerve conduction study. Normal neuro exam currently. Labs including potassium normal. Will prescribe valium prn spasms.   Final Clinical Impressions(s) / ED Diagnoses   Final diagnoses:  None    New Prescriptions New Prescriptions   No medications on file     Charlynne Pander, MD 11/15/16 1646

## 2016-11-26 ENCOUNTER — Emergency Department (HOSPITAL_COMMUNITY)
Admission: EM | Admit: 2016-11-26 | Discharge: 2016-11-26 | Disposition: A | Discharge status: Home / Self Care | Payer: Medicaid - Out of State | Attending: Emergency Medicine | Admitting: Emergency Medicine

## 2016-11-26 ENCOUNTER — Encounter (HOSPITAL_COMMUNITY): Payer: Self-pay

## 2016-11-26 DIAGNOSIS — E119 Type 2 diabetes mellitus without complications: Secondary | ICD-10-CM | POA: Insufficient documentation

## 2016-11-26 DIAGNOSIS — T6591XA Toxic effect of unspecified substance, accidental (unintentional), initial encounter: Secondary | ICD-10-CM | POA: Insufficient documentation

## 2016-11-26 DIAGNOSIS — Z7984 Long term (current) use of oral hypoglycemic drugs: Secondary | ICD-10-CM | POA: Insufficient documentation

## 2016-11-26 DIAGNOSIS — E039 Hypothyroidism, unspecified: Secondary | ICD-10-CM | POA: Insufficient documentation

## 2016-11-26 DIAGNOSIS — Z7982 Long term (current) use of aspirin: Secondary | ICD-10-CM | POA: Insufficient documentation

## 2016-11-26 DIAGNOSIS — R110 Nausea: Secondary | ICD-10-CM | POA: Diagnosis present

## 2016-11-26 DIAGNOSIS — I10 Essential (primary) hypertension: Secondary | ICD-10-CM | POA: Insufficient documentation

## 2016-11-26 LAB — CBG MONITORING, ED: GLUCOSE-CAPILLARY: 107 mg/dL — AB (ref 65–99)

## 2016-11-26 NOTE — ED Provider Notes (Signed)
[facility] EMERGENCY DEPARTMENT Provider Note   CSN: [id number] Arrival date & time: 11/26/16  1556     History   Chief Complaint Chief Complaint  Patient presents with  . Ingestion    Possible    HPI Tiffany Cardenas is a 66 y.o. female.   She presents for evaluation of possible antifreeze ingestion.  She touched a shirt that had wetness, from exposure to antifreeze while her radiator was being filled, without knowing what it was.  After doing that she directly ate a sandwich from [employer].  She became concerned about this, so called poison control who instructed her to come immediately to the emergency department.  She did not have any trouble eating, and has continued to eat, chips while waiting to be seen in the emergency department.  She is currently thirsty.  She denies nausea, dizziness, weakness, blurred vision, back pain, abdominal pain, or paresthesia.  No prior similar problem.  The incident occurred at 2:30 PM today, about 6 hours ago.  There are no other known modifying factors.  HPI  Past Medical History:  Diagnosis Date  . Diabetes mellitus without complication (HCC)   . GAD (generalized anxiety disorder)   . Gout   . Hypertension   . Hypothyroidism   . Panic attacks   . Sciatica   . Thyroid disease     Patient Active Problem List   Diagnosis Date Noted  . Withdrawal symptoms, drug or narcotic (HCC) 08/31/2016  . 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 MDD (major depressive disorder) 08/27/2016    Past Surgical History:  Procedure Laterality Date  . ABDOMINAL HYSTERECTOMY    . BACK SURGERY  2016  . CHOLECYSTECTOMY    . TONSILLECTOMY      OB History    No data available       Home Medications    Prior to Admission medications   Medication Sig Start Date End Date Taking? Authorizing Provider  aspirin 81 MG chewable tablet Chew 81 mg by mouth daily.    [provider]  Calcium Carb-Cholecalciferol (CALCIUM 600 + D PO) Take 1 tablet by mouth every morning.    [provider]  chlordiazePOXIDE (LIBRIUM) 25 MG capsule 50mg  PO TID x 1D, then 25-50mg  PO BID X 1D, then 25-50mg  PO QD X 1D Patient not taking: Reported on 09/26/2016 09/05/16   Melene Plan, DO  diazepam (VALIUM) 5 MG tablet Take 1 tablet (5 mg total) by mouth every 12 (twelve) hours as needed for anxiety or muscle spasms (spasms). 11/15/16   Charlynne Pander, MD  hydrOXYzine (ATARAX/VISTARIL) 50 MG tablet Take 1 tablet (50 mg total) by mouth every 6 (six) hours as needed for anxiety (anxiety/agitation or CIWA < or = 10). Patient not taking: Reported on 11/15/2016 09/04/16   Denzil Magnuson, NP  lisinopril (PRINIVIL,ZESTRIL) 20 MG tablet Take 1 tablet (20 mg total) by mouth daily. 09/05/16   Denzil Magnuson, NP  LORazepam (ATIVAN) 1 MG tablet Take 1 tablet (1 mg total) by mouth 3 (three) times daily as needed for anxiety. Patient taking differently: Take 1 mg by mouth 3 (three) times daily.  09/26/16   Mancel Bale, MD  Lysine 500 MG CAPS Take 500 mg by mouth every morning.    [provider]  metFORMIN (GLUCOPHAGE) 500 MG tablet Take 1 tablet (500 mg total) by mouth 2 (two) times daily with a meal. Patient taking differently: Take 500 mg by  mouth daily with breakfast.  09/04/16   Denzil Magnuson, NP  QUEtiapine (SEROQUEL) 50 MG tablet Take 1 tablet (50 mg total) by mouth at bedtime. Patient not taking: Reported on 09/26/2016 09/04/16   Denzil Magnuson, NP  sertraline (ZOLOFT) 50 MG tablet Take 1 tablet (50 mg total) by mouth daily. Patient not taking: Reported on 09/26/2016 09/05/16   Denzil Magnuson, NP  thyroid (ARMOUR) 90 MG tablet Take 1 tablet (90 mg total) by mouth every morning. 09/04/16   Denzil Magnuson, NP  traZODone (DESYREL) 50 MG tablet Take 1 tablet (50 mg total) by mouth at bedtime and may repeat dose one time if needed. Patient not taking: Reported on 09/26/2016 09/04/16    Denzil Magnuson, NP    Family History History reviewed. No pertinent family history.  Social History Social History  Substance Use Topics  . Smoking status: Never Smoker  . Smokeless tobacco: Never Used  . Alcohol use No     Allergies   Hydroxyzine; Penicillins; Sulfa antibiotics; Cymbalta [duloxetine hcl]; Hydrocodone; Ivp dye [iodinated diagnostic agents]; Chymopapain; Mango flavor; and Papaya derivatives   Review of Systems Review of Systems  All other systems reviewed and are negative.    Physical Exam Updated Vital Signs BP (!) 151/88 (BP Location: Right Arm)   Pulse 83   Temp 98.1 F (36.7 C) (Oral)   Resp 16   SpO2 100%   Physical Exam  Constitutional: She is oriented to person, place, and time. She appears well-developed and well-nourished. No distress.  HENT:  Head: Normocephalic and atraumatic.  Eyes: Pupils are equal, round, and reactive to light. Conjunctivae and EOM are normal.  Neck: Normal range of motion and phonation normal. Neck supple.  Cardiovascular: Normal rate and regular rhythm.   Pulmonary/Chest: Effort normal and breath sounds normal. She exhibits no tenderness.  Musculoskeletal: Normal range of motion.  Neurological: She is alert and oriented to person, place, and time. She exhibits normal muscle tone.  Skin: Skin is warm and dry.  Psychiatric: She has a normal mood and affect. Her behavior is normal. Judgment and thought content normal.  Nursing note and vitals reviewed.    ED Treatments / Results  Labs (all labs ordered are listed, but only abnormal results are displayed) Labs Reviewed  CBG MONITORING, ED - Abnormal; Notable for the following:       Result Value   Glucose-Capillary 107 (*)    All other components within normal limits    EKG  EKG Interpretation None       Radiology No results found.  Procedures Procedures (including critical care time)  Medications Ordered in ED Medications - No data to  display   Initial Impression / Assessment and Plan / ED Course  I have reviewed the triage vital signs and the nursing notes.  Pertinent labs & imaging results that were available during my care of the patient were reviewed by me and considered in my medical decision making (see chart for details).     Patient Vitals for the past 24 hrs:  BP Temp Temp src Pulse Resp SpO2  11/26/16 2030 (!) 151/88 - - 83 16 100 %  11/26/16 1814 (!) 163/77 - - 78 17 98 %  11/26/16 1559 (!) 159/85 98.1 F (36.7 C) Oral 93 17 95 %    At D/C- Reevaluation with update and discussion. After initial assessment and treatment, an updated evaluation reveals no change in clinical status.  She is able to drink easily without difficulty.  Case has been discussed with poison control, by nursing here in the emergency department, x2.  Findings discussed with the patient and all questions were answered. Jamacia Jester L      Final Clinical Impressions(s) / ED Diagnoses   Final diagnoses:  Accidental ingestion of substance, initial encounter   Nontoxic ingestion, accidental.  No evidence for complicating features.  Nursing Notes Reviewed/ Care Coordinated Applicable Imaging Reviewed Interpretation of Laboratory Data incorporated into ED treatment  The patient appears reasonably screened and/or stabilized for discharge and I doubt any other medical condition or other [facility] requiring further screening, evaluation, or treatment in the ED at this time prior to discharge.  Plan: Home Medications-continue current medications; Home Treatments-gradually advance diet as tolerated; return here if the recommended treatment, does not improve the symptoms; Recommended follow up-return here if needed for problems.   New Prescriptions Discharge Medication List as of 11/26/2016  8:30 PM       Mancel BaleWentz, Jamion Carter, MD 11/27/16 1053

## 2016-11-26 NOTE — Discharge Instructions (Signed)
There are no signs of serious injury, form your contact with the antifreeze.  Make sure that you are able to eat and drink, and if so, you will probably be fine.  Return here if needed for problems.

## 2016-11-26 NOTE — ED Notes (Signed)
Spoke with poison control who states there is no need for any further labs/work up/evaluation/ observation. Dr. Effie ShyWentz informed

## 2016-11-26 NOTE — ED Notes (Signed)
Spoke to poison control who reports no treatment necessary at this time.

## 2016-11-26 NOTE — ED Triage Notes (Signed)
[facility]- pt coming from home, she touched a shirt with antifreeze on it, did not wash her hands, and ate a chicken sandwich. Pt called poison control and she was told to come here. Pt complains of dizziness and nausea. Pt alert and oriented.   Vitals: 152/80, 90bpm, RR14, SPO2 98% room air. CBG 135

## 2016-11-27 ENCOUNTER — Encounter (HOSPITAL_COMMUNITY): Payer: Self-pay

## 2016-11-27 ENCOUNTER — Emergency Department (HOSPITAL_COMMUNITY)
Admission: EM | Admit: 2016-11-27 | Discharge: 2016-11-27 | Disposition: A | Discharge status: Home / Self Care | Payer: Medicaid - Out of State | Attending: Emergency Medicine | Admitting: Emergency Medicine

## 2016-11-27 ENCOUNTER — Emergency Department (HOSPITAL_COMMUNITY): Payer: Medicaid - Out of State

## 2016-11-27 DIAGNOSIS — Z79899 Other long term (current) drug therapy: Secondary | ICD-10-CM | POA: Diagnosis not present

## 2016-11-27 DIAGNOSIS — R079 Chest pain, unspecified: Secondary | ICD-10-CM | POA: Insufficient documentation

## 2016-11-27 DIAGNOSIS — I10 Essential (primary) hypertension: Secondary | ICD-10-CM | POA: Insufficient documentation

## 2016-11-27 DIAGNOSIS — E119 Type 2 diabetes mellitus without complications: Secondary | ICD-10-CM | POA: Insufficient documentation

## 2016-11-27 DIAGNOSIS — E039 Hypothyroidism, unspecified: Secondary | ICD-10-CM | POA: Insufficient documentation

## 2016-11-27 DIAGNOSIS — Z7984 Long term (current) use of oral hypoglycemic drugs: Secondary | ICD-10-CM | POA: Diagnosis not present

## 2016-11-27 DIAGNOSIS — Z7982 Long term (current) use of aspirin: Secondary | ICD-10-CM | POA: Insufficient documentation

## 2016-11-27 LAB — CBC
HEMATOCRIT: 36.7 % (ref 36.0–46.0)
HEMOGLOBIN: 12.5 g/dL (ref 12.0–15.0)
MCH: 32.7 pg (ref 26.0–34.0)
MCHC: 34.1 g/dL (ref 30.0–36.0)
MCV: 96.1 fL (ref 78.0–100.0)
Platelets: 238 10*3/uL (ref 150–400)
RBC: 3.82 MIL/uL — AB (ref 3.87–5.11)
RDW: 12.7 % (ref 11.5–15.5)
WBC: 5.3 10*3/uL (ref 4.0–10.5)

## 2016-11-27 LAB — BASIC METABOLIC PANEL
ANION GAP: 8 (ref 5–15)
BUN: 15 mg/dL (ref 6–20)
CHLORIDE: 103 mmol/L (ref 101–111)
CO2: 26 mmol/L (ref 22–32)
Calcium: 9.6 mg/dL (ref 8.9–10.3)
Creatinine, Ser: 0.82 mg/dL (ref 0.44–1.00)
GFR calc Af Amer: >60 mL/min (ref >60)
GFR calc non Af Amer: >60 mL/min (ref >60)
GLUCOSE: 178 mg/dL — AB (ref 65–99)
Potassium: 4 mmol/L (ref 3.5–5.1)
Sodium: 137 mmol/L (ref 135–145)

## 2016-11-27 LAB — I-STAT TROPONIN, ED: TROPONIN I, POC: 0 ng/mL (ref 0.00–0.08)

## 2016-11-27 MED ORDER — IBUPROFEN 400 MG PO TABS
400.0000 mg | ORAL_TABLET | Freq: Once | ORAL | Status: AC | PRN
  Administered 2016-11-27: 400 mg via ORAL

## 2016-11-27 MED ORDER — IBUPROFEN 400 MG PO TABS
ORAL_TABLET | ORAL | Status: AC
  Filled 2016-11-27: qty 1

## 2016-11-27 NOTE — ED Notes (Signed)
Patient called for vitals recheck, no answer.  

## 2016-11-27 NOTE — ED Triage Notes (Signed)
Pt arrives to ED with complaints of left sided chest pain under left breast that radiates to right side. PT states the pain began this morning around 1000 while she was sitting down. PT endorses SOB, nausea, and dizziness with onset of pain. PT was seen here yesterday for eating sandwich with antifreeze on hands

## 2016-11-27 NOTE — ED Provider Notes (Signed)
[facility] EMERGENCY DEPARTMENT Provider Note   CSN: [id number] Arrival date & time: 11/27/16  1342     History   Chief Complaint Chief Complaint  Patient presents with  . Chest Pain  . Shortness of Breath    HPI Tiffany Cardenas is a 66 y.o. female.  She presents for evaluation of chest pain.  Chest pain started earlier today, came and went but returned again for short period.  There is no associated diaphoresis, nausea shortness of breath or cough.  Yesterday she was evaluated by me after a potential antifreeze ingestion.  She was now found to have a significant ingestion, and discharged.  She had been instructed to return for problems, and became concerned when she had the chest pain today.  No prior history of similar pain, no cardiac history and no ongoing pulmonary disorders.  She feels better now has been able to eat and drink, and is comfortable.  There are no other known modifying factors.     HPI  Past Medical History:  Diagnosis Date  . Diabetes mellitus without complication (HCC)   . GAD (generalized anxiety disorder)   . Gout   . Hypertension   . Hypothyroidism   . Panic attacks   . Sciatica   . Thyroid disease     Patient Active Problem List   Diagnosis Date Noted  . Withdrawal symptoms, drug or narcotic (HCC) 08/31/2016  . 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 MDD (major depressive disorder) 08/27/2016    Past Surgical History:  Procedure Laterality Date  . ABDOMINAL HYSTERECTOMY    . BACK SURGERY  2016  . CHOLECYSTECTOMY    . TONSILLECTOMY      OB History    No data available       Home Medications    Prior to Admission medications   Medication Sig Start Date End Date Taking? Authorizing Provider  aspirin 81 MG chewable tablet Chew 81 mg by mouth daily.   Yes [provider]  Calcium Carb-Cholecalciferol (CALCIUM 600 + D PO) Take 1 tablet by mouth every  morning.   Yes [provider]  L-Lysine HCl 500 MG CAPS Take 500 mg by mouth daily.   Yes [provider]  lisinopril (PRINIVIL,ZESTRIL) 20 MG tablet Take 1 tablet (20 mg total) by mouth daily. 09/05/16  Yes Denzil Magnuson, NP  LORazepam (ATIVAN) 1 MG tablet Take 1 tablet (1 mg total) by mouth 3 (three) times daily as needed for anxiety. Patient taking differently: Take 1 mg by mouth 3 (three) times daily.  09/26/16  Yes Mancel Bale, MD  metFORMIN (GLUCOPHAGE) 500 MG tablet Take 1 tablet (500 mg total) by mouth 2 (two) times daily with a meal. Patient taking differently: Take 500 mg by mouth daily with breakfast.  09/04/16  Yes Denzil Magnuson, NP  senna-docusate (SENOKOT-S) 8.6-50 MG tablet Take 1 tablet by mouth daily as needed. 07/28/14  Yes [provider]  thyroid (ARMOUR) 90 MG tablet Take 1 tablet (90 mg total) by mouth every morning. 09/04/16  Yes Denzil Magnuson, NP  chlordiazePOXIDE (LIBRIUM) 25 MG capsule 50mg  PO TID x 1D, then 25-50mg  PO BID X 1D, then 25-50mg  PO QD X 1D Patient not taking: Reported on 09/26/2016 09/05/16   Melene Plan, DO  diazepam (VALIUM) 5 MG tablet Take 1 tablet (5 mg total) by mouth every 12 (twelve) hours as needed for anxiety or muscle spasms (spasms). Patient not taking: Reported on  11/27/2016 11/15/16   Charlynne Pander, MD  hydrOXYzine (ATARAX/VISTARIL) 50 MG tablet Take 1 tablet (50 mg total) by mouth every 6 (six) hours as needed for anxiety (anxiety/agitation or CIWA < or = 10). Patient not taking: Reported on 11/15/2016 09/04/16   Denzil Magnuson, NP  QUEtiapine (SEROQUEL) 50 MG tablet Take 1 tablet (50 mg total) by mouth at bedtime. Patient not taking: Reported on 09/26/2016 09/04/16   Denzil Magnuson, NP  sertraline (ZOLOFT) 50 MG tablet Take 1 tablet (50 mg total) by mouth daily. Patient not taking: Reported on 09/26/2016 09/05/16   Denzil Magnuson, NP    Family History No family history on file.  Social History Social  History  Substance Use Topics  . Smoking status: Never Smoker  . Smokeless tobacco: Never Used  . Alcohol use No     Allergies   Hydroxyzine; Penicillins; Sulfa antibiotics; Cymbalta [duloxetine hcl]; Hydrocodone; Ivp dye [iodinated diagnostic agents]; Chymopapain; Mango flavor; and Papaya derivatives   Review of Systems Review of Systems  All other systems reviewed and are negative.    Physical Exam Updated Vital Signs BP 137/65 (BP Location: Right Arm)   Pulse 71   Temp 98.2 F (36.8 C) (Oral)   Resp 18   Wt 86.2 kg (190 lb)   SpO2 98%   BMI 31.62 kg/m   Physical Exam  Constitutional: She is oriented to person, place, and time. She appears well-developed and well-nourished. No distress.  HENT:  Head: Normocephalic and atraumatic.  Eyes: Pupils are equal, round, and reactive to light. Conjunctivae and EOM are normal.  Neck: Normal range of motion and phonation normal. Neck supple.  Cardiovascular: Normal rate and regular rhythm.   Pulmonary/Chest: Effort normal and breath sounds normal. No respiratory distress. She has no wheezes. She exhibits no tenderness.  Abdominal: Soft. She exhibits no distension. There is no tenderness. There is no guarding.  Musculoskeletal: Normal range of motion.  Neurological: She is alert and oriented to person, place, and time. She exhibits normal muscle tone.  Skin: Skin is warm and dry.  Psychiatric: Her behavior is normal. Judgment and thought content normal.  Anxious  Nursing note and vitals reviewed.    ED Treatments / Results  Labs (all labs ordered are listed, but only abnormal results are displayed) Labs Reviewed  BASIC METABOLIC PANEL - Abnormal; Notable for the following:       Result Value   Glucose, Bld 178 (*)    All other components within normal limits  CBC - Abnormal; Notable for the following:    RBC 3.82 (*)    All other components within normal limits  I-STAT TROPONIN, ED  CBG MONITORING, ED    EKG  EKG  Interpretation  Date/Time:  Tuesday November 27 2016 13:51:18 EDT Ventricular Rate:  82 PR Interval:  146 QRS Duration: 84 QT Interval:  368 QTC Calculation: 429 R Axis:   -41 Text Interpretation:  Normal sinus rhythm Left axis deviation Abnormal ECG since last tracing no significant change Confirmed by Mancel Bale [phone]) on 11/27/2016 10:06:52 PM       Radiology Dg Chest 2 View  Result Date: 11/27/2016 CLINICAL DATA:  Left-sided chest pain radiating to the back today, some shortness of breath EXAM: CHEST  2 VIEW COMPARISON:  Chest x-ray of 11/15/2016 FINDINGS: No active infiltrate or effusion is seen. Mediastinal and hilar contours are unremarkable. The calcified granuloma in the periphery the left mid lung is stable. The heart is within normal limits in  size. No bony abnormality is seen. IMPRESSION: Stable chest x-ray. No active lung disease. Calcified granuloma on the left. Electronically Signed   By: Dwyane DeePaul  Barry M.D.   On: 11/27/2016 14:36    Procedures Procedures (including critical care time)  Medications Ordered in ED Medications  ibuprofen (ADVIL,MOTRIN) tablet 400 mg (400 mg Oral Given 11/27/16 1935)     Initial Impression / Assessment and Plan / ED Course  I have reviewed the triage vital signs and the nursing notes.  Pertinent labs & imaging results that were available during my care of the patient were reviewed by me and considered in my medical decision making (see chart for details).  Clinical Course as of Nov 28 2206  Tue Nov 27, 2016  2206 GFR, Est Non African American: >60 [EW]    Clinical Course User Index [EW] Mancel BaleWentz, Jameisha Stofko, MD     Patient Vitals for the past 24 hrs:  BP Temp Temp src Pulse Resp SpO2 Weight  11/27/16 1703 137/65 - - 71 18 98 % -  11/27/16 1356 - - - - - - 86.2 kg (190 lb)  11/27/16 1355 (!) 176/63 98.2 F (36.8 C) Oral 77 16 99 % -    At D/C Reevaluation with update and discussion. After initial assessment and treatment, an  updated evaluation reveals no change in status, comfortable, findings discussed and questions answered. Friend Dorfman L      Final Clinical Impressions(s) / ED Diagnoses   Final diagnoses:  Chest pain, unspecified type   Nonspecific CP, doubt ACS, PE or PNE. Moderate anxiety and patient concern for problems related to inadvertent ingestion yesterday. Seen by me then. Doubt esophageal disorder.  Nursing Notes Reviewed/ Care Coordinated Applicable Imaging Reviewed Interpretation of Laboratory Data incorporated into ED treatment  The patient appears reasonably screened and/or stabilized for discharge and I doubt any other medical condition or other [facility] requiring further screening, evaluation, or treatment in the ED at this time prior to discharge.  Plan: Home Medications- usual; Home Treatments- rest, fluids; return here if the recommended treatment, does not improve the symptoms; Recommended follow up- PCP prn   New Prescriptions New Prescriptions   No medications on file     Mancel BaleWentz, Maximilliano Kersh, MD 11/28/16 1241

## 2016-11-27 NOTE — ED Notes (Signed)
Called pt multiple times to re check vitals and unable to find pt.

## 2016-11-27 NOTE — ED Notes (Signed)
Pt was found in triage waiting.

## 2016-11-27 NOTE — ED Notes (Signed)
PT states understanding of care given, follow up care. PT ambulated from ED to car with a steady gait.  

## 2016-11-28 LAB — CBG MONITORING, ED
Glucose-Capillary: 84 mg/dL (ref 65–99)
Glucose-Capillary: 141 mg/dL — ABNORMAL HIGH (ref 65–99)

## 2016-12-07 ENCOUNTER — Emergency Department (HOSPITAL_COMMUNITY): Payer: Medicaid - Out of State

## 2016-12-07 ENCOUNTER — Emergency Department (HOSPITAL_COMMUNITY)
Admission: EM | Admit: 2016-12-07 | Discharge: 2016-12-07 | Disposition: A | Discharge status: Home / Self Care | Payer: Medicaid - Out of State | Attending: Emergency Medicine | Admitting: Emergency Medicine

## 2016-12-07 DIAGNOSIS — I10 Essential (primary) hypertension: Secondary | ICD-10-CM | POA: Diagnosis not present

## 2016-12-07 DIAGNOSIS — Z79899 Other long term (current) drug therapy: Secondary | ICD-10-CM | POA: Insufficient documentation

## 2016-12-07 DIAGNOSIS — E119 Type 2 diabetes mellitus without complications: Secondary | ICD-10-CM | POA: Diagnosis not present

## 2016-12-07 DIAGNOSIS — Z7984 Long term (current) use of oral hypoglycemic drugs: Secondary | ICD-10-CM | POA: Diagnosis not present

## 2016-12-07 DIAGNOSIS — M79661 Pain in right lower leg: Secondary | ICD-10-CM | POA: Insufficient documentation

## 2016-12-07 DIAGNOSIS — E039 Hypothyroidism, unspecified: Secondary | ICD-10-CM | POA: Diagnosis not present

## 2016-12-07 DIAGNOSIS — Z7982 Long term (current) use of aspirin: Secondary | ICD-10-CM | POA: Insufficient documentation

## 2016-12-07 DIAGNOSIS — M25551 Pain in right hip: Secondary | ICD-10-CM

## 2016-12-07 LAB — CBG MONITORING, ED: GLUCOSE-CAPILLARY: 102 mg/dL — AB (ref 65–99)

## 2016-12-07 MED ORDER — ACETAMINOPHEN 500 MG PO TABS
500.0000 mg | ORAL_TABLET | Freq: Once | ORAL | Status: AC
  Administered 2016-12-07: 500 mg via ORAL
  Filled 2016-12-07: qty 1

## 2016-12-07 MED ORDER — ENOXAPARIN SODIUM 100 MG/ML ~~LOC~~ SOLN
1.0000 mg/kg | Freq: Once | SUBCUTANEOUS | Status: AC
  Administered 2016-12-07: 85 mg via SUBCUTANEOUS
  Filled 2016-12-07: qty 0.85

## 2016-12-07 NOTE — ED Provider Notes (Signed)
[facility]-EMERGENCY DEPT Provider Note   CSN: [id number] Arrival date & time: 12/07/16  1921     History   Chief Complaint Chief Complaint  Patient presents with  . Hip Pain    HPI Tiffany Cardenas is a 66 y.o. female.  The history is provided by the patient and medical records. No language interpreter was used.   Tiffany Cardenas is a 66 y.o. female  with a PMH of DM, HTN, anxiety who presents to the Emergency Department complaining of pain to the right lateral hip which radiates down to right calf beginning today.  Patient states that the pain is worst in the calf.  She states that she will take 2-3 steps, but then cannot continue to ambulate because the leg "gives out".  Her right calf will intermittently feel very cold, then very hot.  This just occurs in the calf and upper thigh.  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  She notes today's symptoms feel somewhat similar to her prior DVT.  She was on Xarelto for 3 months, but no longer on anticoagulation.  She does take a baby aspirin daily. No recent travel / immobilizations / surgeries. No hormones. No chest pain or shortness of breath. Hx of right-sided numbness and weakness. She has been seen by neurology for this where no known neurologic cause was seen. She was given referral for PT.  No new numbness/weakness to the RLE today.   Past Medical History:  Diagnosis Date  . Diabetes mellitus without complication (HCC)   . GAD (generalized anxiety disorder)   . Gout   . Hypertension   . Hypothyroidism   . Panic attacks   . Sciatica   . Thyroid disease     Patient Active Problem List   Diagnosis Date Noted  . Withdrawal symptoms, drug or narcotic (HCC) 08/31/2016  . 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 MDD (major depressive disorder) 08/27/2016    Past Surgical History:  Procedure Laterality Date    . ABDOMINAL HYSTERECTOMY    . BACK SURGERY  2016  . CHOLECYSTECTOMY    . TONSILLECTOMY      OB History    No data available       Home Medications    Prior to Admission medications   Medication Sig Start Date End Date Taking? Authorizing Provider  aspirin 81 MG chewable tablet Chew 81 mg by mouth daily.   Yes [provider]  Calcium Carb-Cholecalciferol (CALCIUM 600 + D PO) Take 1 tablet by mouth every morning.   Yes [provider]  L-Lysine HCl 500 MG CAPS Take 500 mg by mouth daily.   Yes [provider]  lisinopril (PRINIVIL,ZESTRIL) 20 MG tablet Take 1 tablet (20 mg total) by mouth daily. 09/05/16  Yes Denzil Magnuson, NP  LORazepam (ATIVAN) 1 MG tablet Take 1 tablet (1 mg total) by mouth 3 (three) times daily as needed for anxiety. Patient taking differently: Take 1 mg by mouth 3 (three) times daily.  09/26/16  Yes Mancel Bale, MD  metFORMIN (GLUCOPHAGE) 500 MG tablet Take 1 tablet (500 mg total) by mouth 2 (two) times daily with a meal. Patient taking differently: Take 500 mg by mouth daily with breakfast.  09/04/16  Yes Denzil Magnuson, NP  thyroid (ARMOUR) 90 MG tablet Take 1 tablet (90 mg total) by mouth every morning. 09/04/16  Yes Maisie Fus,  Garlan Fillers, NP  chlordiazePOXIDE (LIBRIUM) 25 MG capsule 50mg  PO TID x 1D, then 25-50mg  PO BID X 1D, then 25-50mg  PO QD X 1D Patient not taking: Reported on 09/26/2016 09/05/16   Melene Plan, DO  diazepam (VALIUM) 5 MG tablet Take 1 tablet (5 mg total) by mouth every 12 (twelve) hours as needed for anxiety or muscle spasms (spasms). Patient not taking: Reported on 11/27/2016 11/15/16   Charlynne Pander, MD  hydrOXYzine (ATARAX/VISTARIL) 50 MG tablet Take 1 tablet (50 mg total) by mouth every 6 (six) hours as needed for anxiety (anxiety/agitation or CIWA < or = 10). Patient not taking: Reported on 11/15/2016 09/04/16   Denzil Magnuson, NP  QUEtiapine (SEROQUEL) 50 MG tablet Take 1 tablet (50 mg total) by mouth at  bedtime. Patient not taking: Reported on 09/26/2016 09/04/16   Denzil Magnuson, NP  senna-docusate (SENOKOT-S) 8.6-50 MG tablet Take 1 tablet by mouth daily as needed for moderate constipation.  07/28/14   [provider]  sertraline (ZOLOFT) 50 MG tablet Take 1 tablet (50 mg total) by mouth daily. Patient not taking: Reported on 09/26/2016 09/05/16   Denzil Magnuson, NP    Family History No family history on file.  Social History Social History  Substance Use Topics  . Smoking status: Never Smoker  . Smokeless tobacco: Never Used  . Alcohol use No     Allergies   Hydroxyzine; Penicillins; Sulfa antibiotics; Cymbalta [duloxetine hcl]; Hydrocodone; Ivp dye [iodinated diagnostic agents]; Chymopapain; Mango flavor; and Papaya derivatives   Review of Systems Review of Systems  Respiratory: Negative for shortness of breath.   Cardiovascular: Negative for chest pain.  Musculoskeletal: Positive for arthralgias and myalgias.  Skin: Negative for color change and wound.  All other systems reviewed and are negative.    Physical Exam Updated Vital Signs BP (!) 161/80   Pulse 92   Temp 98.3 F (36.8 C) (Oral)   Resp 18   SpO2 98%   Physical Exam  Constitutional: She is oriented to person, place, and time. She appears well-developed and well-nourished. No distress.  HENT:  Head: Normocephalic and atraumatic.  Cardiovascular: Normal rate, regular rhythm and normal heart sounds.   No murmur heard. Pulmonary/Chest: Effort normal and breath sounds normal. No respiratory distress.  Abdominal: Soft. She exhibits no distension. There is no tenderness.  Musculoskeletal: She exhibits no edema.  Tenderness to palpation of right lateral hip without overlying skin changes.  She is also tender to palpation to the right posterior calf. + homan's. 2+ DP. Sensation intact.  Neurological: She is alert and oriented to person, place, and time.  Skin: Skin is warm and dry.  Nursing note and  vitals reviewed.    ED Treatments / Results  Labs (all labs ordered are listed, but only abnormal results are displayed) Labs Reviewed - No data to display  EKG  EKG Interpretation None       Radiology Dg Hip Unilat W Or Wo Pelvis 2-3 Views Right  Result Date: 12/07/2016 CLINICAL DATA:  66 y/o  F; right hip pain. EXAM: DG HIP (WITH OR WITHOUT PELVIS) 2-3V RIGHT COMPARISON:  None. FINDINGS: There is no evidence of hip fracture or dislocation. There is no evidence of arthropathy or other focal bone abnormality. IMPRESSION: Negative. Electronically Signed   By: Mitzi Hansen M.D.   On: 12/07/2016 20:25    Procedures Procedures (including critical care time)  Medications Ordered in ED Medications  enoxaparin (LOVENOX) injection 85 mg (85 mg Subcutaneous Given 12/07/16  2110)  acetaminophen (TYLENOL) tablet 500 mg (500 mg Oral Given 12/07/16 2110)     Initial Impression / Assessment and Plan / ED Course  I have reviewed the triage vital signs and the nursing notes.  Pertinent labs & imaging results that were available during my care of the patient were reviewed by me and considered in my medical decision making (see chart for details).    Clayborn HeronMargaret D Lothrop is a 66 y.o. female who presents to ED for right hip and calf pain which began today. Hx of DVT in LLE after lumbar surgery two years ago. Patient states that pain today feels similar to prior DVT. Tenderness to right calf with + Homan's. DG of the hip negative. Vascular ultrasound unavailable at this time. Lovenox injection given and patient [facility]. Reasons to return to ER and symptomatic home care discussed. All questions answered.   Final Clinical Impressions(s) / ED Diagnoses   Final diagnoses:  Right hip pain  Right calf pain    New Prescriptions New Prescriptions   No medications on file     Ward, Chase PicketJaime Pilcher, PA-C 12/07/16 2134    Shaune PollackIsaacs, Cameron,  MD 12/09/16 [phone]

## 2016-12-07 NOTE — ED Notes (Signed)
Pt was weak on the right side during assessment. This finding is located in her chart and has been assessed by nurology with no acute findings and PT has been recommended. PA was notified.

## 2016-12-07 NOTE — Discharge Instructions (Signed)
It was my pleasure taking care of you today!   [facility] at 8 am tomorrow morning and tell them you are there for a vascular study. If you cannot make it, please call [phone] at 8am to set up an appointment later in the day.   Return to ER for new or worsening symptoms, any additional concerns.

## 2016-12-07 NOTE — ED Triage Notes (Addendum)
Pt BIB [facility]. Pt is c/o rt hip pain that travels down that leg to her foot. PMS in tact, no color or temp change noted. Pt has a hx DVT and states that this feels the same. Started yesterday morning about 0600. Ambulatory upon arrival. Denies trauma to area.

## 2016-12-07 NOTE — ED Notes (Signed)
Pt reported that her sugar felt like it was low. CBG was 102. Pt says that when she is lower than 110 she starts to feel weak. Peanut butter and graham crackers provided.

## 2016-12-08 ENCOUNTER — Ambulatory Visit (HOSPITAL_COMMUNITY)
Admission: RE | Admit: 2016-12-08 | Discharge: 2016-12-08 | Disposition: A | Discharge status: Home / Self Care | Payer: Medicaid - Out of State | Source: Ambulatory Visit | Attending: Emergency Medicine | Admitting: Emergency Medicine

## 2016-12-08 ENCOUNTER — Inpatient Hospital Stay (HOSPITAL_COMMUNITY): Admit: 2016-12-08 | Payer: Self-pay

## 2016-12-08 DIAGNOSIS — M79609 Pain in unspecified limb: Secondary | ICD-10-CM

## 2016-12-08 DIAGNOSIS — M79661 Pain in right lower leg: Secondary | ICD-10-CM | POA: Insufficient documentation

## 2016-12-08 NOTE — Progress Notes (Signed)
Right lower extremity venous duplex has been completed. Negative for DVT.  12/08/16 8:15 AM Olen CordialGreg Slater Mcmanaman RVT

## 2017-02-06 ENCOUNTER — Encounter (HOSPITAL_COMMUNITY): Payer: Self-pay | Admitting: Emergency Medicine

## 2017-02-06 ENCOUNTER — Emergency Department (HOSPITAL_COMMUNITY): Payer: Medicaid - Out of State

## 2017-02-06 ENCOUNTER — Emergency Department (HOSPITAL_COMMUNITY)
Admission: EM | Admit: 2017-02-06 | Discharge: 2017-02-07 | Disposition: A | Discharge status: Home / Self Care | Payer: Medicaid - Out of State | Attending: Emergency Medicine | Admitting: Emergency Medicine

## 2017-02-06 DIAGNOSIS — Z7982 Long term (current) use of aspirin: Secondary | ICD-10-CM | POA: Insufficient documentation

## 2017-02-06 DIAGNOSIS — E119 Type 2 diabetes mellitus without complications: Secondary | ICD-10-CM | POA: Insufficient documentation

## 2017-02-06 DIAGNOSIS — R202 Paresthesia of skin: Secondary | ICD-10-CM | POA: Diagnosis not present

## 2017-02-06 DIAGNOSIS — F333 Major depressive disorder, recurrent, severe with psychotic symptoms: Secondary | ICD-10-CM | POA: Diagnosis not present

## 2017-02-06 DIAGNOSIS — E039 Hypothyroidism, unspecified: Secondary | ICD-10-CM | POA: Diagnosis not present

## 2017-02-06 DIAGNOSIS — R0602 Shortness of breath: Secondary | ICD-10-CM | POA: Insufficient documentation

## 2017-02-06 DIAGNOSIS — Z79899 Other long term (current) drug therapy: Secondary | ICD-10-CM | POA: Diagnosis not present

## 2017-02-06 DIAGNOSIS — Z7984 Long term (current) use of oral hypoglycemic drugs: Secondary | ICD-10-CM | POA: Insufficient documentation

## 2017-02-06 DIAGNOSIS — I10 Essential (primary) hypertension: Secondary | ICD-10-CM | POA: Diagnosis not present

## 2017-02-06 LAB — URINALYSIS, ROUTINE W REFLEX MICROSCOPIC
Bilirubin Urine: NEGATIVE
GLUCOSE, UA: NEGATIVE mg/dL
Hgb urine dipstick: NEGATIVE
Ketones, ur: NEGATIVE mg/dL
LEUKOCYTES UA: NEGATIVE
Nitrite: NEGATIVE
PH: 6 (ref 5.0–8.0)
Protein, ur: NEGATIVE mg/dL
Specific Gravity, Urine: 1.003 — ABNORMAL LOW (ref 1.005–1.030)

## 2017-02-06 LAB — RAPID URINE DRUG SCREEN, HOSP PERFORMED
Amphetamines: NOT DETECTED
Barbiturates: NOT DETECTED
Benzodiazepines: POSITIVE — AB
Cocaine: NOT DETECTED
Opiates: NOT DETECTED
Tetrahydrocannabinol: NOT DETECTED

## 2017-02-06 LAB — PROTIME-INR
INR: 0.97
Prothrombin Time: 12.8 seconds (ref 11.4–15.2)

## 2017-02-06 LAB — CBG MONITORING, ED
GLUCOSE-CAPILLARY: 125 mg/dL — AB (ref 65–99)
Glucose-Capillary: 103 mg/dL — ABNORMAL HIGH (ref 65–99)

## 2017-02-06 LAB — COMPREHENSIVE METABOLIC PANEL WITH GFR
ALT: 20 U/L (ref 14–54)
AST: 21 U/L (ref 15–41)
Albumin: 3.8 g/dL (ref 3.5–5.0)
Alkaline Phosphatase: 55 U/L (ref 38–126)
Anion gap: 9 (ref 5–15)
BUN: 16 mg/dL (ref 6–20)
CO2: 23 mmol/L (ref 22–32)
Calcium: 9.4 mg/dL (ref 8.9–10.3)
Chloride: 105 mmol/L (ref 101–111)
Creatinine, Ser: 0.82 mg/dL (ref 0.44–1.00)
GFR calc Af Amer: >60 mL/min
GFR calc non Af Amer: >60 mL/min
Glucose, Bld: 122 mg/dL — ABNORMAL HIGH (ref 65–99)
Potassium: 3.7 mmol/L (ref 3.5–5.1)
Sodium: 137 mmol/L (ref 135–145)
Total Bilirubin: 0.5 mg/dL (ref 0.3–1.2)
Total Protein: 6.6 g/dL (ref 6.5–8.1)

## 2017-02-06 LAB — I-STAT TROPONIN, ED: Troponin i, poc: 0 ng/mL (ref 0.00–0.08)

## 2017-02-06 LAB — ETHANOL: Alcohol, Ethyl (B): <10 mg/dL

## 2017-02-06 LAB — CBC
HEMATOCRIT: 35.3 % — AB (ref 36.0–46.0)
HEMOGLOBIN: 12 g/dL (ref 12.0–15.0)
MCH: 33.1 pg (ref 26.0–34.0)
MCHC: 34 g/dL (ref 30.0–36.0)
MCV: 97.2 fL (ref 78.0–100.0)
Platelets: 239 10*3/uL (ref 150–400)
RBC: 3.63 MIL/uL — ABNORMAL LOW (ref 3.87–5.11)
RDW: 12.4 % (ref 11.5–15.5)
WBC: 6.2 10*3/uL (ref 4.0–10.5)

## 2017-02-06 LAB — DIFFERENTIAL
Basophils Absolute: 0 10*3/uL (ref 0.0–0.1)
Basophils Relative: 1 %
Eosinophils Absolute: 0.3 10*3/uL (ref 0.0–0.7)
Eosinophils Relative: 5 %
Lymphocytes Relative: 32 %
Lymphs Abs: 2 10*3/uL (ref 0.7–4.0)
Monocytes Absolute: 0.3 10*3/uL (ref 0.1–1.0)
Monocytes Relative: 6 %
Neutro Abs: 3.5 10*3/uL (ref 1.7–7.7)
Neutrophils Relative %: 56 %

## 2017-02-06 LAB — APTT: aPTT: 29 s (ref 24–36)

## 2017-02-06 MED ORDER — LORAZEPAM 2 MG/ML IJ SOLN
1.0000 mg | Freq: Once | INTRAMUSCULAR | Status: AC | PRN
  Administered 2017-02-06: 1 mg via INTRAVENOUS
  Filled 2017-02-06: qty 1

## 2017-02-06 NOTE — ED Triage Notes (Signed)
Patient coming from home via [facility]. Patient complaining of right sided muscle contractions from arm, abdomen, down to leg. Patient also complaining of SOB. EKG with ems unremarkable with unifocal pvcs. 4 mg zofran given for nausea. Patient reports light headedness, and diarrhea.

## 2017-02-06 NOTE — ED Notes (Signed)
Placed pt on bedpan, toleated well. Pt had output.

## 2017-02-06 NOTE — ED Provider Notes (Signed)
[facility] EMERGENCY DEPARTMENT Provider Note   CSN: [id number] Arrival date & time: 02/06/17  1734     History   Chief Complaint Chief Complaint  Patient presents with  . Weakness    HPI Tiffany Cardenas is a 67 y.o. female.  The history is provided by the patient. No language interpreter was used.  Weakness     Tiffany Cardenas is a 67 y.o. female who presents to the Emergency Department complaining of weakness.  She reports 2 days of right-sided numbness and weakness.  Symptoms began abruptly and have been constant in nature since they develop.  They have slightly worsened over the last 2 days.  She denies any fevers, chest pain, nausea, vomiting, abdominal pain, diarrhea, dysuria.  No prior similar symptoms.  She has a history of diabetes and hypertension.  She is a non-smoker takes no estrogens.  She describes the weakness as a tightness and pulling sensation in her arm and leg.  Her symptoms are worse when she attempts to ambulate.  Past Medical History:  Diagnosis Date  . Diabetes mellitus without complication (HCC)   . GAD (generalized anxiety disorder)   . Gout   . Hypertension   . Hypothyroidism   . Panic attacks   . Sciatica   . Thyroid disease     Patient Active Problem List   Diagnosis Date Noted  . Withdrawal symptoms, drug or narcotic (HCC) 08/31/2016  . 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 MDD (major depressive disorder) 08/27/2016    Past Surgical History:  Procedure Laterality Date  . ABDOMINAL HYSTERECTOMY    . BACK SURGERY  2016  . CHOLECYSTECTOMY    . TONSILLECTOMY      OB History    No data available       Home Medications    Prior to Admission medications   Medication Sig Start Date End Date Taking? Authorizing Provider  aspirin 81 MG chewable tablet Chew 81 mg by mouth daily.   Yes [provider]  Calcium Carb-Cholecalciferol (CALCIUM 600 + D PO) Take 1  tablet by mouth every morning.   Yes [provider]  L-Lysine HCl 500 MG CAPS Take 500 mg by mouth daily.   Yes [provider]  lisinopril (PRINIVIL,ZESTRIL) 20 MG tablet Take 1 tablet (20 mg total) by mouth daily. 09/05/16  Yes Denzil Magnuson, NP  LORazepam (ATIVAN) 1 MG tablet Take 1 tablet (1 mg total) by mouth 3 (three) times daily as needed for anxiety. Patient taking differently: Take 1 mg by mouth 3 (three) times daily.  09/26/16  Yes Mancel Bale, MD  metFORMIN (GLUCOPHAGE) 500 MG tablet Take 1 tablet (500 mg total) by mouth 2 (two) times daily with a meal. Patient taking differently: Take 500 mg by mouth daily with breakfast.  09/04/16  Yes Denzil Magnuson, NP  senna-docusate (SENOKOT-S) 8.6-50 MG tablet Take 1 tablet by mouth daily as needed for moderate constipation.  07/28/14  Yes [provider]  thyroid (ARMOUR) 90 MG tablet Take 1 tablet (90 mg total) by mouth every morning. 09/04/16  Yes Denzil Magnuson, NP  chlordiazePOXIDE (LIBRIUM) 25 MG capsule 50mg  PO TID x 1D, then 25-50mg  PO BID X 1D, then 25-50mg  PO QD X 1D Patient not taking: Reported on 09/26/2016 09/05/16   Melene Plan, DO  diazepam (VALIUM) 5 MG tablet Take 1 tablet (5 mg total) by mouth every 12 (twelve) hours as needed for anxiety or muscle  spasms (spasms). Patient not taking: Reported on 11/27/2016 11/15/16   Charlynne Pander, MD  hydrOXYzine (ATARAX/VISTARIL) 50 MG tablet Take 1 tablet (50 mg total) by mouth every 6 (six) hours as needed for anxiety (anxiety/agitation or CIWA < or = 10). Patient not taking: Reported on 11/15/2016 09/04/16   Denzil Magnuson, NP  QUEtiapine (SEROQUEL) 50 MG tablet Take 1 tablet (50 mg total) by mouth at bedtime. Patient not taking: Reported on 02/06/2017 09/04/16   Denzil Magnuson, NP  sertraline (ZOLOFT) 50 MG tablet Take 1 tablet (50 mg total) by mouth daily. Patient not taking: Reported on 09/26/2016 09/05/16   Denzil Magnuson, NP    Family History No  family history on file.  Social History Social History   Tobacco Use  . Smoking status: Never Smoker  . Smokeless tobacco: Never Used  Substance Use Topics  . Alcohol use: No  . Drug use: No     Allergies   Hydroxyzine; Penicillins; Sulfa antibiotics; Cymbalta [duloxetine hcl]; Hydrocodone; Ivp dye [iodinated diagnostic agents]; Chymopapain; Mango flavor; and Papaya derivatives   Review of Systems Review of Systems  Neurological: Positive for weakness.  All other systems reviewed and are negative.    Physical Exam Updated Vital Signs BP (!) 145/66   Pulse 77   Temp 98.8 F (37.1 C)   Resp 18   Ht 5\' 5"  (1.651 m)   Wt 86.6 kg (191 lb)   SpO2 96%   BMI 31.78 kg/m   Physical Exam  Constitutional: She is oriented to person, place, and time. She appears well-developed and well-nourished.  HENT:  Head: Normocephalic and atraumatic.  Cardiovascular: Normal rate and regular rhythm.  No murmur heard. Pulmonary/Chest: Effort normal and breath sounds normal. No respiratory distress.  Abdominal: Soft. There is no tenderness. There is no rebound and no guarding.  Musculoskeletal: She exhibits no edema or tenderness.  2+ DP pulses bilaterally  Neurological: She is alert and oriented to person, place, and time.  Right upper extremity and right lower extremity drift.  4-5 strength in right upper and right lower extremity.  5 out of 5 strength in left upper and left lower extremity.  Altered sensation to light touch in the right arm and right leg.  No facial asymmetry.  Skin: Skin is warm and dry.  Psychiatric: Her behavior is normal.  Anxious appearing  Nursing note and vitals reviewed.    ED Treatments / Results  Labs (all labs ordered are listed, but only abnormal results are displayed) Labs Reviewed  CBC - Abnormal; Notable for the following components:      Result Value   RBC 3.63 (*)    HCT 35.3 (*)    All other components within normal limits  COMPREHENSIVE  METABOLIC PANEL - Abnormal; Notable for the following components:   Glucose, Bld 122 (*)    All other components within normal limits  RAPID URINE DRUG SCREEN, HOSP PERFORMED - Abnormal; Notable for the following components:   Benzodiazepines POSITIVE (*)    All other components within normal limits  URINALYSIS, ROUTINE W REFLEX MICROSCOPIC - Abnormal; Notable for the following components:   Color, Urine COLORLESS (*)    Specific Gravity, Urine 1.003 (*)    All other components within normal limits  CBG MONITORING, ED - Abnormal; Notable for the following components:   Glucose-Capillary 125 (*)    All other components within normal limits  CBG MONITORING, ED - Abnormal; Notable for the following components:   Glucose-Capillary 103 (*)  All other components within normal limits  ETHANOL  PROTIME-INR  APTT  DIFFERENTIAL  I-STAT TROPONIN, ED    EKG  EKG Interpretation None       Radiology Ct Head Wo Contrast  Result Date: 02/06/2017 CLINICAL DATA:  Dizziness and headache EXAM: CT HEAD WITHOUT CONTRAST TECHNIQUE: Contiguous axial images were obtained from the base of the skull through the vertex without intravenous contrast. COMPARISON:  July 26, 2014 FINDINGS: Brain: There is age related volume loss. There is no intracranial mass, hemorrhage, extra-axial fluid collection, or midline shift. There is slight small vessel disease in the centra semiovale bilaterally. Elsewhere gray-white compartments appear normal. No evident acute infarct. Vascular: No hyperdense vessel. No appreciable vascular calcification. Skull: Bony calvarium appears intact. Sinuses/Orbits: There is mucosal thickening along the anterior left maxillary antrum. There is mild mucosal thickening in several ethmoid air cells. Other visualized paranasal sinuses are clear. Orbits appear symmetric bilaterally. Other: There is opacification in several mastoid air cells bilaterally. IMPRESSION: Age related volume loss with mild  periventricular small vessel disease. No acute infarct evident. No mass or hemorrhage. Mucosal thickening in the anterior left maxillary antrum and several ethmoid air cells. Opacification noted in several mastoid air cells. Electronically Signed   By: Bretta BangWilliam  Woodruff III M.D.   On: 02/06/2017 19:19   Mr Angiogram Head Wo Contrast  Result Date: 02/06/2017 CLINICAL DATA:  Initial evaluation for right-sided muscle contractions, dizziness. EXAM: MRI HEAD WITHOUT CONTRAST MRA HEAD WITHOUT CONTRAST TECHNIQUE: Multiplanar, multiecho pulse sequences of the brain and surrounding structures were obtained without intravenous contrast. Angiographic images of the head were obtained using MRA technique without contrast. COMPARISON:  Prior CT from earlier the same day as well as previous MRI from 07/12/2014. FINDINGS: MRI HEAD FINDINGS Brain: Generalized age appropriate cerebral atrophy. Patchy T2/FLAIR hyperintensity within the periventricular, deep, and subcortical white matter both cerebral hemispheres, most likely related chronic small vessel ischemic disease, mild for age. No abnormal foci of restricted diffusion to suggest acute or subacute ischemia. Gray-white matter differentiation maintained. Tiny remote lacunar infarct noted within the left thalamus. No other areas of chronic infarction. No foci of susceptibility artifact to suggest acute or chronic intracranial hemorrhage. No mass lesion, midline shift or mass effect. No hydrocephalus. No extra-axial fluid collection. Major dural sinuses are grossly patent. Incidental note made of an empty sella. Midline structures intact and normal. Vascular: Major intracranial vascular flow voids well maintained. Skull and upper cervical spine: Craniocervical junction normal. Upper cervical spine within normal limits. Bone marrow signal intensity normal. No scalp soft tissue abnormality. Sinuses/Orbits: Globes and orbital soft tissues within normal limits. Small left maxillary  sinus retention cyst noted. Paranasal sinuses are otherwise clear. Bilateral mastoid effusions, right larger than left, likely benign/ sterile in nature. Inner ear structures normal. Other: None. MRA HEAD FINDINGS ANTERIOR CIRCULATION: Distal cervical segments of the internal carotid arteries are patent with antegrade flow. Petrous, cavernous, and supraclinoid segments widely patent bilaterally. A1 segments, anterior communicating artery common anterior cerebral artery is widely patent bilaterally. Both MCAs well perfused and widely patent without stenosis or occlusion. Mild small vessel atheromatous irregularity within the distal MCA branches bilaterally. POSTERIOR CIRCULATION: Vertebral artery is widely patent to the vertebrobasilar junction partially visualized posterior inferior cerebral arteries patent. Basilar artery mildly tortuous but widely patent to its distal aspect. Superior cerebral arteries patent bilaterally. Right PCA supplied via the basilar. 3 mm conical shaped focal outpouching arising at the confluence of the right P1 and P2 segments demonstrates a  small vessel arising from its tip, felt to be most consistent with a vascular infundibulum, stable from previous. Fetal type origin of the left PCA. Moderate atheromatous regularity within knee right P2 segment without high-grade stenosis. Short-segment moderate tandem left P2 stenoses. PCAs are patent to their distal aspects. IMPRESSION: MRI HEAD IMPRESSION: 1. No acute intracranial infarct or other abnormality identified. 2. Small remote left thalamic lacunar infarct. 3. Mild cerebral white matter changes, most likely related to chronic small vessel ischemic disease, progressed relative to 2016. MRA HEAD IMPRESSION: 1. No large or proximal arterial branch occlusion. No high-grade or correctable stenosis. 2. Mild intracranial atherosclerosis as above, most notable within the PCAs bilaterally, similar to previous. Electronically Signed   By: Rise Mu M.D.   On: 02/06/2017 23:06   Mr Brain Wo Contrast  Result Date: 02/06/2017 CLINICAL DATA:  Initial evaluation for right-sided muscle contractions, dizziness. EXAM: MRI HEAD WITHOUT CONTRAST MRA HEAD WITHOUT CONTRAST TECHNIQUE: Multiplanar, multiecho pulse sequences of the brain and surrounding structures were obtained without intravenous contrast. Angiographic images of the head were obtained using MRA technique without contrast. COMPARISON:  Prior CT from earlier the same day as well as previous MRI from 07/12/2014. FINDINGS: MRI HEAD FINDINGS Brain: Generalized age appropriate cerebral atrophy. Patchy T2/FLAIR hyperintensity within the periventricular, deep, and subcortical white matter both cerebral hemispheres, most likely related chronic small vessel ischemic disease, mild for age. No abnormal foci of restricted diffusion to suggest acute or subacute ischemia. Gray-white matter differentiation maintained. Tiny remote lacunar infarct noted within the left thalamus. No other areas of chronic infarction. No foci of susceptibility artifact to suggest acute or chronic intracranial hemorrhage. No mass lesion, midline shift or mass effect. No hydrocephalus. No extra-axial fluid collection. Major dural sinuses are grossly patent. Incidental note made of an empty sella. Midline structures intact and normal. Vascular: Major intracranial vascular flow voids well maintained. Skull and upper cervical spine: Craniocervical junction normal. Upper cervical spine within normal limits. Bone marrow signal intensity normal. No scalp soft tissue abnormality. Sinuses/Orbits: Globes and orbital soft tissues within normal limits. Small left maxillary sinus retention cyst noted. Paranasal sinuses are otherwise clear. Bilateral mastoid effusions, right larger than left, likely benign/ sterile in nature. Inner ear structures normal. Other: None. MRA HEAD FINDINGS ANTERIOR CIRCULATION: Distal cervical segments of the  internal carotid arteries are patent with antegrade flow. Petrous, cavernous, and supraclinoid segments widely patent bilaterally. A1 segments, anterior communicating artery common anterior cerebral artery is widely patent bilaterally. Both MCAs well perfused and widely patent without stenosis or occlusion. Mild small vessel atheromatous irregularity within the distal MCA branches bilaterally. POSTERIOR CIRCULATION: Vertebral artery is widely patent to the vertebrobasilar junction partially visualized posterior inferior cerebral arteries patent. Basilar artery mildly tortuous but widely patent to its distal aspect. Superior cerebral arteries patent bilaterally. Right PCA supplied via the basilar. 3 mm conical shaped focal outpouching arising at the confluence of the right P1 and P2 segments demonstrates a small vessel arising from its tip, felt to be most consistent with a vascular infundibulum, stable from previous. Fetal type origin of the left PCA. Moderate atheromatous regularity within knee right P2 segment without high-grade stenosis. Short-segment moderate tandem left P2 stenoses. PCAs are patent to their distal aspects. IMPRESSION: MRI HEAD IMPRESSION: 1. No acute intracranial infarct or other abnormality identified. 2. Small remote left thalamic lacunar infarct. 3. Mild cerebral white matter changes, most likely related to chronic small vessel ischemic disease, progressed relative to 2016. MRA HEAD IMPRESSION:  1. No large or proximal arterial branch occlusion. No high-grade or correctable stenosis. 2. Mild intracranial atherosclerosis as above, most notable within the PCAs bilaterally, similar to previous. Electronically Signed   By: Rise Mu M.D.   On: 02/06/2017 23:06    Procedures Procedures (including critical care time)  Medications Ordered in ED Medications  LORazepam (ATIVAN) injection 1 mg (1 mg Intravenous Given 02/06/17 2129)  acetaminophen (TYLENOL) tablet 650 mg (650 mg Oral  Given 02/07/17 0109)     Initial Impression / Assessment and Plan / ED Course  I have reviewed the triage vital signs and the nursing notes.  Pertinent labs & imaging results that were available during my care of the patient were reviewed by me and considered in my medical decision making (see chart for details).     Patient here for evaluation of right arm and leg numbness and weakness for the last 3 days.  She does have altered sensation to light touch as well as altered strength in the right upper and right lower extremity.  MRI is negative for acute stroke.  Area of symptoms do not correlate with spinal cord pathology.  On repeat assessment she states that symptoms have truly been present since October but progressive since that time worsening over the last 3 days.  She has had outpatient neurology and PCP follow-up.  Counseled patient on importance of continued outpatient follow-up for repeat assessment.  Discussed home care and return precautions.  Final Clinical Impressions(s) / ED Diagnoses   Final diagnoses:  Paresthesia    ED Discharge Orders    None       Tilden Fossa, MD 02/07/17 [phone]

## 2017-02-06 NOTE — ED Notes (Signed)
Patient transported to MRI 

## 2017-02-06 NOTE — ED Notes (Signed)
Patient in CT. Will complete ekg when patient returns

## 2017-02-07 MED ORDER — ACETAMINOPHEN 325 MG PO TABS
650.0000 mg | ORAL_TABLET | Freq: Once | ORAL | Status: AC
Start: 2017-02-07 — End: 2017-02-07
  Administered 2017-02-07: 650 mg via ORAL
  Filled 2017-02-07: qty 2

## 2017-02-07 NOTE — ED Notes (Signed)
Attempted to ambulate pt with walker and standby assistance. Pt able to stand on side of bed. Pt reports that she cant bend her right knee or feel her rt leg. Pt states that she won't be able to walk on it and that she has been dragging her leg when she walks. MD aware.

## 2017-02-07 NOTE — ED Notes (Signed)
ED Provider at bedside. 

## 2017-02-07 NOTE — ED Notes (Signed)
Pt verbalizes understanding of d/c instructions. Pt taken out to lobby in wheelchair with all belongings.   

## 2017-03-07 ENCOUNTER — Encounter (HOSPITAL_COMMUNITY): Payer: Self-pay

## 2017-03-07 ENCOUNTER — Emergency Department (HOSPITAL_COMMUNITY)
Admission: EM | Admit: 2017-03-07 | Discharge: 2017-03-08 | Disposition: A | Discharge status: Home / Self Care | Payer: Medicaid - Out of State | Attending: Emergency Medicine | Admitting: Emergency Medicine

## 2017-03-07 DIAGNOSIS — M5441 Lumbago with sciatica, right side: Secondary | ICD-10-CM | POA: Insufficient documentation

## 2017-03-07 DIAGNOSIS — I10 Essential (primary) hypertension: Secondary | ICD-10-CM | POA: Diagnosis not present

## 2017-03-07 DIAGNOSIS — M791 Myalgia, unspecified site: Secondary | ICD-10-CM | POA: Diagnosis present

## 2017-03-07 DIAGNOSIS — Z79899 Other long term (current) drug therapy: Secondary | ICD-10-CM | POA: Insufficient documentation

## 2017-03-07 DIAGNOSIS — G8929 Other chronic pain: Secondary | ICD-10-CM | POA: Diagnosis not present

## 2017-03-07 DIAGNOSIS — M549 Dorsalgia, unspecified: Secondary | ICD-10-CM | POA: Diagnosis not present

## 2017-03-07 DIAGNOSIS — E119 Type 2 diabetes mellitus without complications: Secondary | ICD-10-CM | POA: Insufficient documentation

## 2017-03-07 DIAGNOSIS — M79604 Pain in right leg: Secondary | ICD-10-CM | POA: Diagnosis not present

## 2017-03-07 DIAGNOSIS — R109 Unspecified abdominal pain: Secondary | ICD-10-CM | POA: Diagnosis not present

## 2017-03-07 DIAGNOSIS — M62838 Other muscle spasm: Secondary | ICD-10-CM | POA: Diagnosis not present

## 2017-03-07 DIAGNOSIS — M792 Neuralgia and neuritis, unspecified: Secondary | ICD-10-CM

## 2017-03-07 DIAGNOSIS — Z7984 Long term (current) use of oral hypoglycemic drugs: Secondary | ICD-10-CM | POA: Diagnosis not present

## 2017-03-07 DIAGNOSIS — Z7982 Long term (current) use of aspirin: Secondary | ICD-10-CM | POA: Insufficient documentation

## 2017-03-07 DIAGNOSIS — E039 Hypothyroidism, unspecified: Secondary | ICD-10-CM | POA: Insufficient documentation

## 2017-03-07 LAB — URINALYSIS, ROUTINE W REFLEX MICROSCOPIC
Bacteria, UA: NONE SEEN
Bilirubin Urine: NEGATIVE
Glucose, UA: NEGATIVE mg/dL
HGB URINE DIPSTICK: NEGATIVE
Ketones, ur: NEGATIVE mg/dL
NITRITE: NEGATIVE
Protein, ur: NEGATIVE mg/dL
SPECIFIC GRAVITY, URINE: 1.012 (ref 1.005–1.030)
pH: 6 (ref 5.0–8.0)

## 2017-03-07 LAB — COMPREHENSIVE METABOLIC PANEL
ALBUMIN: 3.6 g/dL (ref 3.5–5.0)
ALK PHOS: 58 U/L (ref 38–126)
ALT: 18 U/L (ref 14–54)
AST: 21 U/L (ref 15–41)
Anion gap: 10 (ref 5–15)
BILIRUBIN TOTAL: 0.6 mg/dL (ref 0.3–1.2)
BUN: 22 mg/dL — ABNORMAL HIGH (ref 6–20)
CALCIUM: 9.2 mg/dL (ref 8.9–10.3)
CO2: 22 mmol/L (ref 22–32)
Chloride: 105 mmol/L (ref 101–111)
Creatinine, Ser: 0.92 mg/dL (ref 0.44–1.00)
GFR calc Af Amer: >60 mL/min (ref >60)
GLUCOSE: 111 mg/dL — AB (ref 65–99)
POTASSIUM: 3.7 mmol/L (ref 3.5–5.1)
Sodium: 137 mmol/L (ref 135–145)
TOTAL PROTEIN: 6.2 g/dL — AB (ref 6.5–8.1)

## 2017-03-07 LAB — CBC
HCT: 34.3 % — ABNORMAL LOW (ref 36.0–46.0)
HEMOGLOBIN: 11.8 g/dL — AB (ref 12.0–15.0)
MCH: 33.1 pg (ref 26.0–34.0)
MCHC: 34.4 g/dL (ref 30.0–36.0)
MCV: 96.3 fL (ref 78.0–100.0)
Platelets: 221 10*3/uL (ref 150–400)
RBC: 3.56 MIL/uL — ABNORMAL LOW (ref 3.87–5.11)
RDW: 12.1 % (ref 11.5–15.5)
WBC: 5.7 10*3/uL (ref 4.0–10.5)

## 2017-03-07 LAB — LIPASE, BLOOD: Lipase: 38 U/L (ref 11–51)

## 2017-03-07 MED ORDER — GABAPENTIN 300 MG PO CAPS
300.0000 mg | ORAL_CAPSULE | Freq: Once | ORAL | Status: AC
  Administered 2017-03-08: 300 mg via ORAL
  Filled 2017-03-07: qty 1

## 2017-03-07 MED ORDER — METHOCARBAMOL 500 MG PO TABS
500.0000 mg | ORAL_TABLET | Freq: Once | ORAL | Status: AC
  Administered 2017-03-07: 500 mg via ORAL
  Filled 2017-03-07: qty 1

## 2017-03-07 NOTE — ED Provider Notes (Signed)
[facility] EMERGENCY DEPARTMENT Provider Note   CSN: [id number] Arrival date & time: 03/07/17  2006     History   Chief Complaint Chief Complaint  Patient presents with  . Abdominal Pain  . Sciatica    HPI Tiffany Cardenas is a 67 y.o. female.  Patient complains of diffuse muscular pains, including to back, abdominal wall, and right leg. Spastic in character.  Also has neuropathic pain (burning, paresthesias) to bilateral feet.    Muscle Pain  This is a chronic problem. Episode onset: months; worsening today. The problem occurs constantly. The problem has been gradually worsening. Associated symptoms include abdominal pain (abdominal muscle wall). Pertinent negatives include no chest pain, no headaches and no shortness of breath. Nothing aggravates the symptoms. Nothing relieves the symptoms. She has tried nothing for the symptoms.    Past Medical History:  Diagnosis Date  . Diabetes mellitus without complication (HCC)   . GAD (generalized anxiety disorder)   . Gout   . Hypertension   . Hypothyroidism   . Panic attacks   . Sciatica   . Thyroid disease     Patient Active Problem List   Diagnosis Date Noted  . Withdrawal symptoms, drug or narcotic (HCC) 08/31/2016  . 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 MDD (major depressive disorder) 08/27/2016    Past Surgical History:  Procedure Laterality Date  . ABDOMINAL HYSTERECTOMY    . BACK SURGERY  2016  . CHOLECYSTECTOMY    . TONSILLECTOMY      OB History    No data available       Home Medications    Prior to Admission medications   Medication Sig Start Date End Date Taking? Authorizing Provider  aspirin 81 MG chewable tablet Chew 81 mg by mouth daily.   Yes [provider]  Calcium Carb-Cholecalciferol (CALCIUM 600 + D PO) Take 1 tablet by mouth every morning.   Yes [provider]  L-Lysine HCl 500 MG CAPS Take 500 mg by  mouth daily.   Yes [provider]  lisinopril (PRINIVIL,ZESTRIL) 20 MG tablet Take 1 tablet (20 mg total) by mouth daily. 09/05/16  Yes Denzil Magnuson, NP  LORazepam (ATIVAN) 1 MG tablet Take 1 tablet (1 mg total) by mouth 3 (three) times daily as needed for anxiety. Patient taking differently: Take 1 mg by mouth 3 (three) times daily.  09/26/16  Yes Mancel Bale, MD  metFORMIN (GLUCOPHAGE) 500 MG tablet Take 1 tablet (500 mg total) by mouth 2 (two) times daily with a meal. Patient taking differently: Take 500 mg by mouth daily with breakfast.  09/04/16  Yes Denzil Magnuson, NP  senna-docusate (SENOKOT-S) 8.6-50 MG tablet Take 1 tablet by mouth daily as needed for moderate constipation.  07/28/14  Yes [provider]  thyroid (ARMOUR) 90 MG tablet Take 1 tablet (90 mg total) by mouth every morning. 09/04/16  Yes Denzil Magnuson, NP    Family History No family history on file.  Social History Social History   Tobacco Use  . Smoking status: Never Smoker  . Smokeless tobacco: Never Used  Substance Use Topics  . Alcohol use: No  . Drug use: No     Allergies   Hydroxyzine; Penicillins; Sulfa antibiotics; Cymbalta [duloxetine hcl]; Hydrocodone; Ivp dye [iodinated diagnostic agents]; Chymopapain; Mango flavor; and Papaya derivatives   Review of Systems Review of Systems  Constitutional: Negative for chills and fever.  HENT: Negative for ear pain and  sore throat.   Eyes: Negative for pain and visual disturbance.  Respiratory: Negative for cough and shortness of breath.   Cardiovascular: Negative for chest pain and palpitations.  Gastrointestinal: Positive for abdominal pain (abdominal muscle wall). Negative for vomiting.  Genitourinary: Negative for dysuria and hematuria.  Musculoskeletal: Positive for back pain. Negative for arthralgias.  Skin: Negative for color change and rash.  Neurological: Negative for seizures, syncope and headaches.  Psychiatric/Behavioral:  Positive for dysphoric mood. Negative for suicidal ideas.  All other systems reviewed and are negative.    Physical Exam Updated Vital Signs BP (!) 149/122   Pulse 84   Temp 98.6 F (37 C) (Oral)   Resp 20   SpO2 96%   Physical Exam  Constitutional: She is oriented to person, place, and time. She appears well-developed and well-nourished. No distress.  HENT:  Head: Normocephalic and atraumatic.  Eyes: Conjunctivae and EOM are normal. Pupils are equal, round, and reactive to light.  Neck: Normal range of motion. Neck supple.  Cardiovascular: Normal rate and regular rhythm.  Pulmonary/Chest: Effort normal and breath sounds normal. No respiratory distress.  Abdominal: Soft. There is no tenderness.  Patient states that abdominal spasm had resolved prior to exam.  Musculoskeletal: She exhibits tenderness (to right upper leg, back (SI joints, no midline tenderness)). She exhibits no edema.  Neurological: She is alert and oriented to person, place, and time.  Skin: Skin is warm and dry.  Psychiatric: She has a normal mood and affect.  Nursing note and vitals reviewed.    ED Treatments / Results  Labs (all labs ordered are listed, but only abnormal results are displayed) Labs Reviewed  COMPREHENSIVE METABOLIC PANEL - Abnormal; Notable for the following components:      Result Value   Glucose, Bld 111 (*)    BUN 22 (*)    Total Protein 6.2 (*)    All other components within normal limits  CBC - Abnormal; Notable for the following components:   RBC 3.56 (*)    Hemoglobin 11.8 (*)    HCT 34.3 (*)    All other components within normal limits  URINALYSIS, ROUTINE W REFLEX MICROSCOPIC - Abnormal; Notable for the following components:   Color, Urine STRAW (*)    Leukocytes, UA TRACE (*)    Squamous Epithelial / LPF 0-5 (*)    All other components within normal limits  LIPASE, BLOOD    EKG  EKG Interpretation None       Radiology No results  found.  Procedures Procedures (including critical care time)  Medications Ordered in ED Medications - No data to display   Initial Impression / Assessment and Plan / ED Course  I have reviewed the triage vital signs and the nursing notes.  Pertinent labs & imaging results that were available during my care of the patient were reviewed by me and considered in my medical decision making (see chart for details).     Ms. Pruitt is a 67 year old female with a past medical history significant for depression, anxiety, diabetes, hypertension, hypothyroidism, sciatica who presents for diffuse muscular pains.  The patient had previously spoken with her primary care doctor and requested a muscle relaxant.  Per medical records, her primary care doctor called in a prescription for Robaxin, but the patient was unaware of this.  Medical records reviewed and patient has presented for similar complaints multiple times.  She has had workup including MRIs and nerve conduction studies.  Patient is given Robaxin in the  emergency department.  Labs obtained including CBC, CMP, lipase, UA.  Results are significant for mild anemia at baseline, mild hyperglycemia, and mildly elevated BUN.  Patient is given single dose of gabapentin in the emergency department for her neuropathic pain.  Patient is discharged home with strict return precautions and follow-up instructions, and educational materials.  She is provided a prescription for gabapentin and encouraged to follow-up with her PCP for upward titration.  Patient encouraged to discuss gabapentin titration and possible PT referral with her PCP.  Final Clinical Impressions(s) / ED Diagnoses   Final diagnoses:  Chronic bilateral low back pain with right-sided sciatica  Neuropathic pain  Muscle spasm    ED Discharge Orders        Ordered    gabapentin (NEURONTIN) 300 MG capsule  Daily at bedtime     03/08/17 0016       Garey Hamean, [employer] E, MD 03/08/17  [phone]    Gwyneth SproutPlunkett, [place], MD 03/08/17 [phone]

## 2017-03-07 NOTE — ED Triage Notes (Signed)
[facility] for upper abd pain that has been going on for at least a month and getting worse. Pt states pain is a cramping feelng. Also c/o of pain in lower back that shoots down L leg. Some nausea, denise vomiting/ diarrhea.

## 2017-03-08 DIAGNOSIS — M5441 Lumbago with sciatica, right side: Secondary | ICD-10-CM | POA: Diagnosis not present

## 2017-03-08 LAB — CBG MONITORING, ED: GLUCOSE-CAPILLARY: 103 mg/dL — AB (ref 65–99)

## 2017-03-08 MED ORDER — GABAPENTIN 300 MG PO CAPS: 300.0000 mg | ORAL_CAPSULE | Freq: Every day | ORAL | 0 refills | Status: DC

## 2017-04-07 ENCOUNTER — Emergency Department (HOSPITAL_COMMUNITY)
Admission: EM | Admit: 2017-04-07 | Discharge: 2017-04-08 | Disposition: A | Discharge status: Home / Self Care | Payer: Medicaid - Out of State | Attending: Emergency Medicine | Admitting: Emergency Medicine

## 2017-04-07 ENCOUNTER — Encounter (HOSPITAL_COMMUNITY): Payer: Self-pay

## 2017-04-07 ENCOUNTER — Emergency Department (HOSPITAL_COMMUNITY): Payer: Medicaid - Out of State

## 2017-04-07 ENCOUNTER — Other Ambulatory Visit: Payer: Self-pay

## 2017-04-07 DIAGNOSIS — R0789 Other chest pain: Secondary | ICD-10-CM

## 2017-04-07 DIAGNOSIS — Z7984 Long term (current) use of oral hypoglycemic drugs: Secondary | ICD-10-CM | POA: Diagnosis not present

## 2017-04-07 DIAGNOSIS — E119 Type 2 diabetes mellitus without complications: Secondary | ICD-10-CM | POA: Diagnosis not present

## 2017-04-07 DIAGNOSIS — R002 Palpitations: Secondary | ICD-10-CM | POA: Diagnosis not present

## 2017-04-07 DIAGNOSIS — Z7982 Long term (current) use of aspirin: Secondary | ICD-10-CM | POA: Insufficient documentation

## 2017-04-07 DIAGNOSIS — I10 Essential (primary) hypertension: Secondary | ICD-10-CM | POA: Insufficient documentation

## 2017-04-07 DIAGNOSIS — R0602 Shortness of breath: Secondary | ICD-10-CM

## 2017-04-07 DIAGNOSIS — R42 Dizziness and giddiness: Secondary | ICD-10-CM | POA: Insufficient documentation

## 2017-04-07 DIAGNOSIS — E039 Hypothyroidism, unspecified: Secondary | ICD-10-CM | POA: Insufficient documentation

## 2017-04-07 LAB — CBC
HCT: 37.2 % (ref 36.0–46.0)
HEMOGLOBIN: 12.6 g/dL (ref 12.0–15.0)
MCH: 32.8 pg (ref 26.0–34.0)
MCHC: 33.9 g/dL (ref 30.0–36.0)
MCV: 96.9 fL (ref 78.0–100.0)
Platelets: 217 10*3/uL (ref 150–400)
RBC: 3.84 MIL/uL — AB (ref 3.87–5.11)
RDW: 12.4 % (ref 11.5–15.5)
WBC: 6.3 10*3/uL (ref 4.0–10.5)

## 2017-04-07 LAB — CBG MONITORING, ED: Glucose-Capillary: 109 mg/dL — ABNORMAL HIGH (ref 65–99)

## 2017-04-07 LAB — BASIC METABOLIC PANEL
ANION GAP: 12 (ref 5–15)
BUN: 18 mg/dL (ref 6–20)
CHLORIDE: 103 mmol/L (ref 101–111)
CO2: 22 mmol/L (ref 22–32)
Calcium: 9.1 mg/dL (ref 8.9–10.3)
Creatinine, Ser: 0.76 mg/dL (ref 0.44–1.00)
GFR calc non Af Amer: >60 mL/min (ref >60)
Glucose, Bld: 108 mg/dL — ABNORMAL HIGH (ref 65–99)
POTASSIUM: 4.1 mmol/L (ref 3.5–5.1)
Sodium: 137 mmol/L (ref 135–145)

## 2017-04-07 LAB — I-STAT TROPONIN, ED: TROPONIN I, POC: 0 ng/mL (ref 0.00–0.08)

## 2017-04-07 MED ORDER — ACETAMINOPHEN 325 MG PO TABS
650.0000 mg | ORAL_TABLET | Freq: Once | ORAL | Status: AC
  Administered 2017-04-07: 650 mg via ORAL
  Filled 2017-04-07: qty 2

## 2017-04-07 NOTE — ED Triage Notes (Signed)
Pt arrived via [facility]; per EMS pt coming frm hm with c/o CP that started approx 45-1hr ago on L side; pt stated that BP went up to 212 and started feeling nauseas; Pt has chronic pain and Hx of Fibromyalgia; pt describes pain as "pulling sensation." Pt rec'd 324 of ASA; 168/87; 82; 94; no CBG

## 2017-04-07 NOTE — ED Provider Notes (Addendum)
[facility] EMERGENCY DEPARTMENT Provider Note   CSN: [id number] Arrival date & time: 04/07/17  1924     History   Chief Complaint Chief Complaint  Patient presents with  . Chest Pain    HPI Tiffany Cardenas is a 67 y.o. female who presents with chest pain. PMH significant for HTN, anxiety, hypothyroidism, fibromyalgia, Type 2 diabetes mellitus, hx of DVT.  The patient states that she was lying in bed tonight when she had an acute onset of palpitations and feeling like her her heart was racing.  At the same time she felt the pulling sensation under her left breast and felt short of breath.  She also had some pain in her left mid back and tremors.  And was nauseous.  She checked her blood pressure and it was 212/110.  Therefore she called EMS.  They gave her 2024 of aspirin.  The episode has been coming and going.  Currently she has no symptoms.  She denies fever, chills, cough, syncope, abdominal pain, vomiting, urinary symptoms, leg swelling.  She reports a remote history of a stress test 20 years ago.  She denies cardiac or lung history.  She does not smoke.  She denies history of reflux and worsening of her chronic anxiety.  She had these symptoms several days ago she feels like they were not as severe.  PCP: Dr[facility] Reed Pandy at [facility]   HPI  Past Medical History:  Diagnosis Date  . Diabetes mellitus without complication (HCC)   . GAD (generalized anxiety disorder)   . Gout   . Hypertension   . Hypothyroidism   . Panic attacks   . Sciatica   . Thyroid disease     Patient Active Problem List   Diagnosis Date Noted  . Withdrawal symptoms, drug or narcotic (HCC) 08/31/2016  . 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 MDD (major depressive disorder) 08/27/2016    Past Surgical History:  Procedure Laterality Date  . ABDOMINAL HYSTERECTOMY    . BACK SURGERY  2016  . CHOLECYSTECTOMY    . TONSILLECTOMY      OB History     No data available       Home Medications    Prior to Admission medications   Medication Sig Start Date End Date Taking? Authorizing Provider  aspirin 81 MG chewable tablet Chew 81 mg by mouth daily.    [provider]  Calcium Carb-Cholecalciferol (CALCIUM 600 + D PO) Take 1 tablet by mouth every morning.    [provider]  gabapentin (NEURONTIN) 300 MG capsule Take 1 capsule (300 mg total) by mouth at bedtime. 03/08/17   Garey Ham, MD  L-Lysine HCl 500 MG CAPS Take 500 mg by mouth daily.    [provider]  lisinopril (PRINIVIL,ZESTRIL) 20 MG tablet Take 1 tablet (20 mg total) by mouth daily. 09/05/16   Denzil Magnuson, NP  LORazepam (ATIVAN) 1 MG tablet Take 1 tablet (1 mg total) by mouth 3 (three) times daily as needed for anxiety. Patient taking differently: Take 1 mg by mouth 3 (three) times daily.  09/26/16   Mancel Bale, MD  metFORMIN (GLUCOPHAGE) 500 MG tablet Take 1 tablet (500 mg total) by mouth 2 (two) times daily with a meal. Patient taking differently: Take 500 mg by mouth daily with breakfast.  09/04/16   Denzil Magnuson, NP  senna-docusate (SENOKOT-S) 8.6-50 MG tablet Take 1 tablet by mouth daily as needed for moderate constipation.  07/28/14   [provider]  thyroid (ARMOUR) 90 MG tablet Take 1 tablet (90 mg total) by mouth every morning. 09/04/16   Denzil Magnusonhomas, [place], NP    Family History History reviewed. No pertinent family history.  Social History Social History   Tobacco Use  . Smoking status: Never Smoker  . Smokeless tobacco: Never Used  Substance Use Topics  . Alcohol use: No  . Drug use: No     Allergies   Hydroxyzine; Penicillins; Sulfa antibiotics; Cymbalta [duloxetine hcl]; Hydrocodone; Ivp dye [iodinated diagnostic agents]; Chymopapain; Mango flavor; and Papaya derivatives   Review of Systems Review of Systems  Constitutional: Negative for chills and fever.  Respiratory: Positive for shortness of  breath. Negative for cough and wheezing.   Cardiovascular: Positive for chest pain and palpitations. Negative for leg swelling.  Gastrointestinal: Positive for nausea. Negative for abdominal pain, diarrhea and vomiting.  Genitourinary: Negative for dysuria.  Musculoskeletal: Positive for back pain.  Neurological: Positive for tremors and light-headedness. Negative for syncope and headaches.  All other systems reviewed and are negative.    Physical Exam Updated Vital Signs Temp 98.7 F (37.1 C) (Oral)   Ht 5\' 5"  (1.651 m)   Wt 86.6 kg (191 lb)   BMI 31.78 kg/m   Physical Exam  Constitutional: She is oriented to person, place, and time. She appears well-developed and well-nourished. No distress.  HENT:  Head: Normocephalic and atraumatic.  Eyes: Conjunctivae are normal. Pupils are equal, round, and reactive to light. Right eye exhibits no discharge. Left eye exhibits no discharge. No scleral icterus.  Neck: Normal range of motion.  Cardiovascular: Normal rate and regular rhythm. Exam reveals no gallop and no friction rub.  No murmur heard. Pulmonary/Chest: Effort normal and breath sounds normal. No respiratory distress. She exhibits tenderness (mild left sided chest tenderness).  Abdominal: Soft. Bowel sounds are normal. She exhibits no distension. There is no tenderness.  Musculoskeletal:  Trace bilateral ankle edema  Neurological: She is alert and oriented to person, place, and time.  Skin: Skin is warm and dry.  Psychiatric: Her behavior is normal. She exhibits a depressed mood.  Nursing note and vitals reviewed.    ED Treatments / Results  Labs (all labs ordered are listed, but only abnormal results are displayed) Labs Reviewed  BASIC METABOLIC PANEL - Abnormal; Notable for the following components:      Result Value   Glucose, Bld 108 (*)    All other components within normal limits  CBC - Abnormal; Notable for the following components:   RBC 3.84 (*)    All other  components within normal limits  CBG MONITORING, ED - Abnormal; Notable for the following components:   Glucose-Capillary 109 (*)    All other components within normal limits  I-STAT TROPONIN, ED  I-STAT TROPONIN, ED    EKG  EKG Interpretation  Date/Time:  Sunday April 07 2017 19:25:50 EST Ventricular Rate:  85 PR Interval:    QRS Duration: 93 QT Interval:  365 QTC Calculation: 434 R Axis:   -13 Text Interpretation:  Sinus rhythm Multiple ventricular premature complexes Confirmed by Cathren LaineSteinl, Kevin ([id number]) on 04/07/2017 7:48:20 PM       Radiology Dg Chest 2 View  Result Date: 04/07/2017 CLINICAL DATA:  Elevated blood pressure with chest pain EXAM: CHEST  2 VIEW COMPARISON:  11/27/2016 FINDINGS: No acute pulmonary infiltrate or effusion. Calcified nodule in the left mid lung as before. Normal heart size. No pneumothorax. Degenerative changes of the spine.  IMPRESSION: No active cardiopulmonary disease. Electronically Signed   By: Jasmine Pang M.D.   On: 04/07/2017 21:38    Procedures Procedures (including critical care time)  Medications Ordered in ED Medications  acetaminophen (TYLENOL) tablet 650 mg (650 mg Oral Given 04/07/17 2335)    Initial Impression / Assessment and Plan / ED Course  I have reviewed the triage vital signs and the nursing notes.  Pertinent labs & imaging results that were available during my care of the patient were reviewed by me and considered in my medical decision making (see chart for details).  67 year old who presents with chest pain, SOB, back pain, tremors, palpitations. Chest pain work up is reassuring. Doubt ACS, PE, pericarditis, esophageal rupture, tension pneumothorax, aortic dissection, cardiac tamponade. Symptoms are very atypical. EKG is NSR with PVCs. CXR is negative. Initial and second troponin is 0. Labs are unremarkable. No significant past or family hx of cardiac disease. Patient is non-smoker. HEART score is 1-2. Advised f/u with PCP and  Cardiology.   Final Clinical Impressions(s) / ED Diagnoses   Final diagnoses:  Atypical chest pain  SOB (shortness of breath)  Palpitations    ED Discharge Orders    None       Bethel Born, PA-C 04/08/17 0027    Bethel Born, PA-C 04/08/17 0101    Cathren Laine, MD 04/09/17 [phone]

## 2017-04-08 LAB — I-STAT TROPONIN, ED: Troponin i, poc: 0 ng/mL (ref 0.00–0.08)

## 2017-04-08 NOTE — Discharge Instructions (Signed)
Please follow up with your doctor Follow up with cardiology if symptoms continue

## 2017-04-08 NOTE — ED Notes (Signed)
Pt discharged from ED; instructions provided; Pt encouraged to return to ED if symptoms worsen and to f/u with PCP; Pt verbalized understanding of all instructions 

## 2017-04-17 ENCOUNTER — Emergency Department (HOSPITAL_BASED_OUTPATIENT_CLINIC_OR_DEPARTMENT_OTHER)
Admit: 2017-04-17 | Discharge: 2017-04-17 | Disposition: A | Discharge status: Home / Self Care | Payer: Medicaid - Out of State | Attending: Emergency Medicine | Admitting: Emergency Medicine

## 2017-04-17 ENCOUNTER — Encounter (HOSPITAL_COMMUNITY): Payer: Self-pay | Admitting: Emergency Medicine

## 2017-04-17 ENCOUNTER — Emergency Department (HOSPITAL_COMMUNITY)
Admission: EM | Admit: 2017-04-17 | Discharge: 2017-04-17 | Disposition: A | Discharge status: Home / Self Care | Payer: Medicaid - Out of State | Attending: Emergency Medicine | Admitting: Emergency Medicine

## 2017-04-17 ENCOUNTER — Emergency Department (HOSPITAL_COMMUNITY): Payer: Medicaid - Out of State

## 2017-04-17 DIAGNOSIS — Z7982 Long term (current) use of aspirin: Secondary | ICD-10-CM | POA: Diagnosis not present

## 2017-04-17 DIAGNOSIS — R1013 Epigastric pain: Secondary | ICD-10-CM

## 2017-04-17 DIAGNOSIS — M7989 Other specified soft tissue disorders: Secondary | ICD-10-CM | POA: Diagnosis not present

## 2017-04-17 DIAGNOSIS — E039 Hypothyroidism, unspecified: Secondary | ICD-10-CM | POA: Diagnosis not present

## 2017-04-17 DIAGNOSIS — I10 Essential (primary) hypertension: Secondary | ICD-10-CM | POA: Insufficient documentation

## 2017-04-17 DIAGNOSIS — Z7984 Long term (current) use of oral hypoglycemic drugs: Secondary | ICD-10-CM | POA: Diagnosis not present

## 2017-04-17 DIAGNOSIS — M79604 Pain in right leg: Secondary | ICD-10-CM | POA: Diagnosis not present

## 2017-04-17 DIAGNOSIS — R0789 Other chest pain: Secondary | ICD-10-CM

## 2017-04-17 DIAGNOSIS — E119 Type 2 diabetes mellitus without complications: Secondary | ICD-10-CM | POA: Diagnosis not present

## 2017-04-17 DIAGNOSIS — Z79899 Other long term (current) drug therapy: Secondary | ICD-10-CM | POA: Insufficient documentation

## 2017-04-17 DIAGNOSIS — R0602 Shortness of breath: Secondary | ICD-10-CM | POA: Diagnosis not present

## 2017-04-17 HISTORY — DX: Acute embolism and thrombosis of unspecified deep veins of unspecified lower extremity: I82.409

## 2017-04-17 LAB — COMPREHENSIVE METABOLIC PANEL
ALT: 27 U/L (ref 14–54)
ANION GAP: 9 (ref 5–15)
AST: 26 U/L (ref 15–41)
Albumin: 3.7 g/dL (ref 3.5–5.0)
Alkaline Phosphatase: 64 U/L (ref 38–126)
BUN: 16 mg/dL (ref 6–20)
CHLORIDE: 107 mmol/L (ref 101–111)
CO2: 23 mmol/L (ref 22–32)
Calcium: 9.3 mg/dL (ref 8.9–10.3)
Creatinine, Ser: 0.92 mg/dL (ref 0.44–1.00)
GFR calc non Af Amer: >60 mL/min (ref >60)
Glucose, Bld: 147 mg/dL — ABNORMAL HIGH (ref 65–99)
POTASSIUM: 4 mmol/L (ref 3.5–5.1)
SODIUM: 139 mmol/L (ref 135–145)
Total Bilirubin: 0.7 mg/dL (ref 0.3–1.2)
Total Protein: 6.8 g/dL (ref 6.5–8.1)

## 2017-04-17 LAB — CBC WITH DIFFERENTIAL/PLATELET
BASOS PCT: 1 %
Basophils Absolute: 0.1 10*3/uL (ref 0.0–0.1)
Eosinophils Absolute: 0.3 10*3/uL (ref 0.0–0.7)
Eosinophils Relative: 7 %
HEMATOCRIT: 35.6 % — AB (ref 36.0–46.0)
HEMOGLOBIN: 12 g/dL (ref 12.0–15.0)
LYMPHS ABS: 1.3 10*3/uL (ref 0.7–4.0)
LYMPHS PCT: 28 %
MCH: 32.5 pg (ref 26.0–34.0)
MCHC: 33.7 g/dL (ref 30.0–36.0)
MCV: 96.5 fL (ref 78.0–100.0)
MONOS PCT: 6 %
Monocytes Absolute: 0.3 10*3/uL (ref 0.1–1.0)
NEUTROS ABS: 2.7 10*3/uL (ref 1.7–7.7)
NEUTROS PCT: 58 %
Platelets: 204 10*3/uL (ref 150–400)
RBC: 3.69 MIL/uL — ABNORMAL LOW (ref 3.87–5.11)
RDW: 12.2 % (ref 11.5–15.5)
WBC: 4.6 10*3/uL (ref 4.0–10.5)

## 2017-04-17 LAB — I-STAT TROPONIN, ED
Troponin i, poc: 0 ng/mL (ref 0.00–0.08)
Troponin i, poc: 0 ng/mL (ref 0.00–0.08)

## 2017-04-17 LAB — LIPASE, BLOOD: Lipase: 43 U/L (ref 11–51)

## 2017-04-17 LAB — CBG MONITORING, ED: GLUCOSE-CAPILLARY: 112 mg/dL — AB (ref 65–99)

## 2017-04-17 MED ORDER — SODIUM CHLORIDE 0.9 % IV BOLUS (SEPSIS)
500.0000 mL | Freq: Once | INTRAVENOUS | Status: AC
  Administered 2017-04-17: 500 mL via INTRAVENOUS

## 2017-04-17 MED ORDER — PANTOPRAZOLE SODIUM 20 MG PO TBEC: 20.0000 mg | DELAYED_RELEASE_TABLET | Freq: Every day | ORAL | 1 refills | Status: DC

## 2017-04-17 MED ORDER — HYDROCORTISONE NA SUCCINATE PF 250 MG IJ SOLR
200.0000 mg | Freq: Once | INTRAMUSCULAR | Status: AC
  Administered 2017-04-17: 200 mg via INTRAVENOUS
  Filled 2017-04-17: qty 200

## 2017-04-17 MED ORDER — ONDANSETRON HCL 4 MG/2ML IJ SOLN
4.0000 mg | Freq: Once | INTRAMUSCULAR | Status: AC
  Administered 2017-04-17: 4 mg via INTRAVENOUS
  Filled 2017-04-17: qty 2

## 2017-04-17 MED ORDER — LORAZEPAM 1 MG PO TABS
1.0000 mg | ORAL_TABLET | Freq: Once | ORAL | Status: AC
  Administered 2017-04-17: 1 mg via ORAL
  Filled 2017-04-17: qty 1

## 2017-04-17 MED ORDER — IOPAMIDOL (ISOVUE-370) INJECTION 76%
INTRAVENOUS | Status: AC
  Administered 2017-04-17: 100 mL via INTRAVENOUS
  Filled 2017-04-17: qty 100

## 2017-04-17 MED ORDER — FAMOTIDINE 20 MG PO TABS: 20.0000 mg | ORAL_TABLET | Freq: Two times a day (BID) | ORAL | 0 refills | Status: DC

## 2017-04-17 MED ORDER — MORPHINE SULFATE (PF) 4 MG/ML IV SOLN
4.0000 mg | Freq: Once | INTRAVENOUS | Status: DC
  Filled 2017-04-17: qty 1

## 2017-04-17 MED ORDER — DICYCLOMINE HCL 10 MG PO CAPS
20.0000 mg | ORAL_CAPSULE | Freq: Once | ORAL | Status: AC
  Administered 2017-04-17: 20 mg via ORAL
  Filled 2017-04-17: qty 2

## 2017-04-17 MED ORDER — MORPHINE SULFATE (PF) 4 MG/ML IV SOLN
4.0000 mg | Freq: Once | INTRAVENOUS | Status: AC
  Administered 2017-04-17: 4 mg via INTRAVENOUS
  Filled 2017-04-17: qty 1

## 2017-04-17 MED ORDER — DIPHENHYDRAMINE HCL 50 MG/ML IJ SOLN: 50.0000 mg | Freq: Once | INTRAMUSCULAR | Status: AC

## 2017-04-17 MED ORDER — GI COCKTAIL ~~LOC~~
30.0000 mL | Freq: Once | ORAL | Status: AC
  Administered 2017-04-17: 30 mL via ORAL
  Filled 2017-04-17: qty 30

## 2017-04-17 MED ORDER — DIPHENHYDRAMINE HCL 25 MG PO CAPS
50.0000 mg | ORAL_CAPSULE | Freq: Once | ORAL | Status: AC
  Administered 2017-04-17: 50 mg via ORAL
  Filled 2017-04-17: qty 2

## 2017-04-17 NOTE — Progress Notes (Signed)
Right lower extremity venous duplex has been completed. Negative for DVT. Results were given to Harolyn RutherfordShawn Joy PA.  04/17/17 3:37 PM Olen CordialGreg Iyah Laguna RVT

## 2017-04-17 NOTE — ED Notes (Signed)
Requesting to eat.  Explained that we needed to get CT scan results before she can eat.  Continues to c/o abdominal cramping.  Now rating at 6/10.

## 2017-04-17 NOTE — ED Notes (Addendum)
Vascular tech at bedside. °

## 2017-04-17 NOTE — ED Notes (Signed)
In room to medicate for stomach cramping.  Reports I think its form the morphine.  Physician made aware.

## 2017-04-17 NOTE — ED Triage Notes (Addendum)
Pt arrived via [facility] c/o "chest pressure". Pt has hx of same. While awaiting ED triage on EMS stretcher, pt stated her pain changed to a "stabbing pain". Pt stated she feels "weak and feeling faint." Pt is alert and oriented x4. EMS v/s 156/64, Sp02 94%RA, 18rr, hr 78. Pt states allergy to nitro, EMS gave 324mg  ASA PTA without relief. Per EMS, pt family stated they "have taken to every area hospital and they keep saying nothing is wrong."  EMS EKG at bedside.

## 2017-04-17 NOTE — ED Notes (Signed)
Sitting up eating at this time.  Requesting ativan.  Physician made aware.

## 2017-04-17 NOTE — Discharge Instructions (Addendum)
The lab results were encouraging.  There were no acute abnormalities on imaging studies.    Acid Reflux Treatment  Diet: Please adhere to the enclosed dietary suggestions.  In general, avoid NSAIDs (i.e. ibuprofen, naproxen, etc.), caffeine, alcohol, spicy foods, fatty foods, or any other foods that seem to cause your symptoms to arise.  Protonix: Take this medication daily, 20-30 minutes prior to your first meal, for the next 8 weeks.  Continue to take this medication even if you begin to feel better.  Pepcid: Take this medication twice a day for the next 5 days.  Follow-up: Please follow-up with your primary care provider on this matter.  Return: Return to the ED for significantly worsening symptoms, persistent vomiting, persistent fever, vomiting blood, blood in the stools, dark stools, or any other major concerns.

## 2017-04-17 NOTE — ED Provider Notes (Signed)
[facility]   CSN: [id number] Arrival date & time: 04/17/17  1348     History   Chief Complaint Chief Complaint  Patient presents with  . Chest Pain    HPI Clayborn HeronMargaret D Muscatello is a 67 y.o. female.  HPI   Clayborn HeronMargaret D Leitz is a 67 y.o. female, with a history of DM, DVT, HTN, and hypothyroidism, presenting to the ED with chest pain that began around 11:30 AM. States she was sitting down eating lunch when she began to have left lower chest pain, sharp, radiating to the back, 7/10, lasted for about a minute. Pain then became an "uncomfortable pressure," same location, 5/10, persistent, nonradiating. States, "I had difficulty catching my breath."  Pain increases with deep breathing.  Patient also endorses right leg pain beginning yesterday, right calf, 6/10, aching, radiating into the right thigh.  States it feels similar to the pain she experienced with her DVT in the left leg 2 years ago.  Denies fever/chills, N/V/C/D, peripheral edema, orthopnea, cough, recent illness, diaphoresis, dizziness, hematochezia/melena, or any other complaints.    Past Medical History:  Diagnosis Date  . Diabetes mellitus without complication (HCC)   . DVT (deep venous thrombosis) (HCC) 2017  . GAD (generalized anxiety disorder)   . Gout   . Hypertension   . Hypothyroidism   . Panic attacks   . Sciatica   . Thyroid disease     Patient Active Problem List   Diagnosis Date Noted  . Withdrawal symptoms, drug or narcotic (HCC) 08/31/2016  . 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 MDD (major depressive disorder) 08/27/2016    Past Surgical History:  Procedure Laterality Date  . ABDOMINAL HYSTERECTOMY    . BACK SURGERY  2016  . CHOLECYSTECTOMY    . TONSILLECTOMY      OB History    No data available       Home Medications    Prior to Admission medications   Medication Sig Start Date End Date  Taking? Authorizing Provider  aspirin 81 MG chewable tablet Chew 81 mg by mouth daily.   Yes [provider]  Calcium Carb-Cholecalciferol (CALCIUM 600 + D PO) Take 1 tablet by mouth every morning.   Yes [provider]  gabapentin (NEURONTIN) 300 MG capsule Take 1 capsule (300 mg total) by mouth at bedtime. 03/08/17  Yes Garey Hamean, Michael E, MD  L-Lysine HCl 500 MG CAPS Take 500 mg by mouth daily.   Yes [provider]  lisinopril (PRINIVIL,ZESTRIL) 20 MG tablet Take 1 tablet (20 mg total) by mouth daily. 09/05/16  Yes Denzil Magnusonhomas, Lashunda, NP  LORazepam (ATIVAN) 1 MG tablet Take 1 tablet (1 mg total) by mouth 3 (three) times daily as needed for anxiety. Patient taking differently: Take 1 mg by mouth 3 (three) times daily.  09/26/16  Yes Mancel BaleWentz, Elliott, MD  metFORMIN (GLUCOPHAGE) 500 MG tablet Take 1 tablet (500 mg total) by mouth 2 (two) times daily with a meal. Patient taking differently: Take 500 mg by mouth daily with breakfast.  09/04/16  Yes Denzil Magnusonhomas, Lashunda, NP  methocarbamol (ROBAXIN) 500 MG tablet Take 500 mg by mouth 2 (two) times daily. 03/07/17  Yes [provider]  senna-docusate (SENOKOT-S) 8.6-50 MG tablet Take 1 tablet by mouth daily as needed for moderate constipation.  07/28/14  Yes [provider]  thyroid (ARMOUR) 90 MG tablet Take 1 tablet (90 mg total) by mouth every morning. 09/04/16  Yes Denzil Magnuson, NP  famotidine (PEPCID) 20 MG tablet Take 1 tablet (20 mg total) by mouth 2 (two) times daily for 5 days. 04/17/17 04/22/17  Naima Veldhuizen C, PA-C  pantoprazole (PROTONIX) 20 MG tablet Take 1 tablet (20 mg total) by mouth daily. 04/17/17 06/16/17  Anselm Pancoast, PA-C    Family History Family History  Problem Relation Age of Onset  . Diabetes Father   . Heart attack Father   . Heart attack Paternal Uncle   . Sudden Cardiac Death Paternal Uncle     Social History Social History   Tobacco Use  . Smoking status: Never Smoker  . Smokeless  tobacco: Never Used  Substance Use Topics  . Alcohol use: No  . Drug use: No     Allergies   Hydroxyzine; Penicillins; Sulfa antibiotics; Cymbalta [duloxetine hcl]; Hydrocodone; Ivp dye [iodinated diagnostic agents]; Chymopapain; Mango flavor; and Papaya derivatives   Review of Systems Review of Systems  Constitutional: Negative for chills, diaphoresis and fever.  Respiratory: Positive for shortness of breath.   Cardiovascular: Positive for chest pain. Negative for leg swelling.  Gastrointestinal: Positive for abdominal pain. Negative for blood in stool, constipation, diarrhea, nausea and vomiting.  Musculoskeletal: Positive for myalgias.  Neurological: Negative for dizziness, syncope, weakness and light-headedness.  All other systems reviewed and are negative.    Physical Exam Updated Vital Signs BP (!) 161/80   Pulse 77   Resp 14   SpO2 99%   Physical Exam  Constitutional: She appears well-developed and well-nourished. No distress.  HENT:  Head: Normocephalic and atraumatic.  Eyes: Conjunctivae are normal.  Neck: Neck supple.  Cardiovascular: Normal rate, regular rhythm, normal heart sounds and intact distal pulses.  Pulmonary/Chest: Effort normal and breath sounds normal. No respiratory distress.  Abdominal: Soft. Bowel sounds are normal. There is tenderness. There is no guarding.    Musculoskeletal: She exhibits no edema.  Lymphadenopathy:    She has no cervical adenopathy.  Neurological: She is alert.  Skin: Skin is warm and dry. She is not diaphoretic.  Psychiatric: She has a normal mood and affect. Her behavior is normal.  Nursing note and vitals reviewed.    ED Treatments / Results  Labs (all labs ordered are listed, but only abnormal results are displayed) Labs Reviewed  COMPREHENSIVE METABOLIC PANEL - Abnormal; Notable for the following components:      Result Value   Glucose, Bld 147 (*)    All other components within normal limits  CBC WITH  DIFFERENTIAL/PLATELET - Abnormal; Notable for the following components:   RBC 3.69 (*)    HCT 35.6 (*)    All other components within normal limits  CBG MONITORING, ED - Abnormal; Notable for the following components:   Glucose-Capillary 112 (*)    All other components within normal limits  LIPASE, BLOOD  I-STAT TROPONIN, ED  I-STAT TROPONIN, ED    EKG  EKG Interpretation  Date/Time:  Wednesday April 17 2017 13:54:56 EDT Ventricular Rate:  74 PR Interval:    QRS Duration: 90 QT Interval:  383 QTC Calculation: 425 R Axis:   -22 Text Interpretation:  Sinus rhythm Left ventricular hypertrophy No STEMI.  Confirmed by Alona Bene [phone]) on 04/17/2017 10:59:56 PM       Radiology Dg Chest 2 View  Result Date: 04/17/2017 CLINICAL DATA:  Chest pain EXAM: CHEST - 2 VIEW COMPARISON:  None. FINDINGS: Low lung volumes with atelectasis at the left lung base. No pneumonic consolidation or CHF. Heart  is top-normal in size with minimal aortic atherosclerosis. No pleural effusion or pneumothorax. Degenerative changes are noted along the dorsal spine. IMPRESSION: Low lung volumes with atelectasis at the left lung base. No active pulmonary disease. Aortic atherosclerosis. Electronically Signed   By: Tollie Eth M.D.   On: 04/17/2017 16:19   Ct Angio Chest Pe W And/or Wo Contrast  Result Date: 04/17/2017 CLINICAL DATA:  Initial evaluation for acute chest discomfort, abdominal pain. EXAM: CT ANGIOGRAPHY CHEST CT ABDOMEN AND PELVIS WITH CONTRAST TECHNIQUE: Multidetector CT imaging of the chest was performed using the standard protocol during bolus administration of intravenous contrast. Multiplanar CT image reconstructions and MIPs were obtained to evaluate the vascular anatomy. Multidetector CT imaging of the abdomen and pelvis was performed using the standard protocol during bolus administration of intravenous contrast. CONTRAST:  ISOVUE-370 IOPAMIDOL (ISOVUE-370) INJECTION 76% COMPARISON:  Prior  radiograph from earlier the same day. FINDINGS: CTA CHEST FINDINGS Cardiovascular: Intrathoracic aorta of normal caliber and appearance without aneurysm or other acute abnormality. Visualized great vessels within normal limits. Heart size at the upper limits of normal. No pericardial effusion. Pulmonary arterial tree adequately opacified for evaluation. Main pulmonary artery measures at the upper limits of normal at 3 cm in diameter. No filling defect to suggest acute pulmonary embolism. Re-formatted imaging confirms these findings. Mediastinum/Nodes: No pathologically enlarged mediastinal, hilar, or axillary lymph nodes. No mediastinal mass or hematoma. Esophagus within normal limits. Lungs/Pleura: Tracheobronchial tree intact and widely patent. Lungs well inflated bilaterally. Mild scattered atelectatic changes present within the bilateral lower lobes, right middle lobe, and lingula. No consolidative opacity to suggest acute infection or pneumonia. No pulmonary edema or pleural effusion. 8 mm calcified granuloma present within the left lower lobe. No worrisome pulmonary nodule or mass. No pneumothorax. Musculoskeletal: External soft tissues demonstrate no acute abnormality. No acute osseous abnormality. No worrisome lytic or blastic osseous lesions. Review of the MIP images confirms the above findings. CT ABDOMEN and PELVIS FINDINGS Hepatobiliary: Subcentimeter hypodensity within the inferior right hepatic lobe noted, too small the characterize, but of doubtful significance. Liver otherwise demonstrates a normal contrast enhanced appearance. Gallbladder surgically absent. Mild intrahepatic biliary dilatation like related post cholecystectomy changes. Pancreas: Pancreas within normal limits. Spleen: Few scattered granulomas noted within the spleen. Spleen otherwise unremarkable. Adrenals/Urinary Tract: Adrenal glands within normal limits. Kidneys equal size with symmetric enhancement. No nephrolithiasis,  hydronephrosis, or focal enhancing renal mass. No hydroureter. Bladder within normal limits. Stomach/Bowel: Stomach within normal limits. No evidence for bowel obstruction. Appendix is surgically absent. No acute inflammatory changes seen about the bowels. Vascular/Lymphatic: Mild aorto bi-iliac atherosclerotic disease. Normal intravascular enhancement seen throughout the intra-abdominal aorta. Mesenteric vessels are patent proximally. No pathologically enlarged intra-abdominopelvic lymph nodes. Reproductive: Uterus is absent.  Ovaries within normal limits. Other: No free air or fluid. Musculoskeletal: No acute osseous abnormality. No worrisome lytic or blastic osseous lesions. Review of the MIP images confirms the above findings. IMPRESSION: 1. No CTA evidence for acute pulmonary embolism. 2. Mild scattered bibasilar atelectatic changes. No other acute cardiopulmonary abnormality identified. 3. No acute abnormality within the abdomen and pelvis. 4. Postoperative changes from prior cholecystectomy, appendectomy, and hysterectomy. Electronically Signed   By: Rise Mu M.D.   On: 04/17/2017 20:04   Ct Abdomen Pelvis W Contrast  Result Date: 04/17/2017 CLINICAL DATA:  Initial evaluation for acute chest discomfort, abdominal pain. EXAM: CT ANGIOGRAPHY CHEST CT ABDOMEN AND PELVIS WITH CONTRAST TECHNIQUE: Multidetector CT imaging of the chest was performed using the standard  protocol during bolus administration of intravenous contrast. Multiplanar CT image reconstructions and MIPs were obtained to evaluate the vascular anatomy. Multidetector CT imaging of the abdomen and pelvis was performed using the standard protocol during bolus administration of intravenous contrast. CONTRAST:  ISOVUE-370 IOPAMIDOL (ISOVUE-370) INJECTION 76% COMPARISON:  Prior radiograph from earlier the same day. FINDINGS: CTA CHEST FINDINGS Cardiovascular: Intrathoracic aorta of normal caliber and appearance without aneurysm or  other acute abnormality. Visualized great vessels within normal limits. Heart size at the upper limits of normal. No pericardial effusion. Pulmonary arterial tree adequately opacified for evaluation. Main pulmonary artery measures at the upper limits of normal at 3 cm in diameter. No filling defect to suggest acute pulmonary embolism. Re-formatted imaging confirms these findings. Mediastinum/Nodes: No pathologically enlarged mediastinal, hilar, or axillary lymph nodes. No mediastinal mass or hematoma. Esophagus within normal limits. Lungs/Pleura: Tracheobronchial tree intact and widely patent. Lungs well inflated bilaterally. Mild scattered atelectatic changes present within the bilateral lower lobes, right middle lobe, and lingula. No consolidative opacity to suggest acute infection or pneumonia. No pulmonary edema or pleural effusion. 8 mm calcified granuloma present within the left lower lobe. No worrisome pulmonary nodule or mass. No pneumothorax. Musculoskeletal: External soft tissues demonstrate no acute abnormality. No acute osseous abnormality. No worrisome lytic or blastic osseous lesions. Review of the MIP images confirms the above findings. CT ABDOMEN and PELVIS FINDINGS Hepatobiliary: Subcentimeter hypodensity within the inferior right hepatic lobe noted, too small the characterize, but of doubtful significance. Liver otherwise demonstrates a normal contrast enhanced appearance. Gallbladder surgically absent. Mild intrahepatic biliary dilatation like related post cholecystectomy changes. Pancreas: Pancreas within normal limits. Spleen: Few scattered granulomas noted within the spleen. Spleen otherwise unremarkable. Adrenals/Urinary Tract: Adrenal glands within normal limits. Kidneys equal size with symmetric enhancement. No nephrolithiasis, hydronephrosis, or focal enhancing renal mass. No hydroureter. Bladder within normal limits. Stomach/Bowel: Stomach within normal limits. No evidence for bowel  obstruction. Appendix is surgically absent. No acute inflammatory changes seen about the bowels. Vascular/Lymphatic: Mild aorto bi-iliac atherosclerotic disease. Normal intravascular enhancement seen throughout the intra-abdominal aorta. Mesenteric vessels are patent proximally. No pathologically enlarged intra-abdominopelvic lymph nodes. Reproductive: Uterus is absent.  Ovaries within normal limits. Other: No free air or fluid. Musculoskeletal: No acute osseous abnormality. No worrisome lytic or blastic osseous lesions. Review of the MIP images confirms the above findings. IMPRESSION: 1. No CTA evidence for acute pulmonary embolism. 2. Mild scattered bibasilar atelectatic changes. No other acute cardiopulmonary abnormality identified. 3. No acute abnormality within the abdomen and pelvis. 4. Postoperative changes from prior cholecystectomy, appendectomy, and hysterectomy. Electronically Signed   By: Rise Mu M.D.   On: 04/17/2017 20:04    Procedures Procedures (including critical care time)  Medications Ordered in ED Medications  hydrocortisone sodium succinate (SOLU-CORTEF) injection 200 mg (200 mg Intravenous Given 04/17/17 1502)  diphenhydrAMINE (BENADRYL) capsule 50 mg (50 mg Oral Given 04/17/17 1747)    Or  diphenhydrAMINE (BENADRYL) injection 50 mg ( Intravenous See Alternative 04/17/17 1747)  morphine 4 MG/ML injection 4 mg (4 mg Intravenous Given 04/17/17 1445)  ondansetron (ZOFRAN) injection 4 mg (4 mg Intravenous Given 04/17/17 1445)  dicyclomine (BENTYL) capsule 20 mg (20 mg Oral Given 04/17/17 1519)  sodium chloride 0.9 % bolus 500 mL (0 mLs Intravenous Stopped 04/17/17 1719)  iopamidol (ISOVUE-370) 76 % injection (100 mLs Intravenous Contrast Given 04/17/17 1858)  gi cocktail (Maalox,Lidocaine,Donnatal) (30 mLs Oral Given 04/17/17 2030)  LORazepam (ATIVAN) tablet 1 mg (1 mg Oral Given 04/17/17 2130)  Initial Impression / Assessment and Plan / ED Course  I have reviewed the  triage vital signs and the nursing notes.  Pertinent labs & imaging results that were available during my care of the patient were reviewed by me and considered in my medical decision making (see chart for details).  Clinical Course as of Apr 19 1602  Wed Apr 17, 2017  1512 Vascular tech states no evidence of DVT in right leg.  [SJ]  1516 Patient complaining of abdominal cramping she states began after administration of morphine and hydrocortisone.  [SJ]  2200 This pulse rate was connected with the patient asking for ativan because "it is time for my ativan and I'm afraid I won't get it soon enough if I have to wait until I get home."  Patient was given a home dose of her ativan prior to discharge.  Pulse Rate: (!) 105 [SJ]    Clinical Course User Index [SJ] Markeese Boyajian C, PA-C    Patient presents with lower left chest pain.  Due to the sudden onset of pain, combined with the patient's history of DVT, and lack of anticoagulation, I do not think the patient is low risk enough to rely on d-dimer for PE rule out. Patient required pretreatment prior to the CT scans.  No acute abnormalities were noted on the scans.  Lab results encouraging.  Delta troponins negative. Patient's discomfort improved throughout ED course.  PCP follow-up. The patient was given instructions for home care as well as return precautions. Patient voices understanding of these instructions, accepts the plan, and is comfortable with discharge.   Findings and plan of care discussed with Cathi Roan, MD.   Vitals:   04/17/17 1815 04/17/17 1845 04/17/17 1915 04/17/17 1930  BP: (!) 144/75 (!) 148/74 (!) 153/70 (!) 147/70  Pulse: 87 85 95 88  Resp: (!) 22 (!) 0 (!) 23 17  SpO2: 95% 95% 93% 95%   Vitals:   04/17/17 2100 04/17/17 2115 04/17/17 2130 04/17/17 2136  BP: (!) 164/86 (!) 160/83 (!) 155/75   Pulse: 90 98 (!) 105   Resp: 17 (!) 22 19   Temp:    98.6 F (37 C)  TempSrc:    Oral  SpO2: 95% 94% 95%      Final  Clinical Impressions(s) / ED Diagnoses   Final diagnoses:  Atypical chest pain  Epigastric pain    ED Discharge Orders        Ordered    pantoprazole (PROTONIX) 20 MG tablet  Daily     04/17/17 2109    famotidine (PEPCID) 20 MG tablet  2 times daily     04/17/17 2109       Anselm Pancoast, PA-C 04/18/17 1604    LongArlyss Repress, MD 04/19/17 1004

## 2017-06-09 ENCOUNTER — Emergency Department (HOSPITAL_COMMUNITY)
Admission: EM | Admit: 2017-06-09 | Discharge: 2017-06-10 | Disposition: A | Discharge status: Home / Self Care | Payer: Medicaid - Out of State | Attending: Emergency Medicine | Admitting: Emergency Medicine

## 2017-06-09 ENCOUNTER — Emergency Department (HOSPITAL_COMMUNITY): Payer: Medicaid - Out of State

## 2017-06-09 ENCOUNTER — Encounter (HOSPITAL_COMMUNITY): Payer: Self-pay | Admitting: Emergency Medicine

## 2017-06-09 DIAGNOSIS — M25511 Pain in right shoulder: Secondary | ICD-10-CM | POA: Insufficient documentation

## 2017-06-09 DIAGNOSIS — E039 Hypothyroidism, unspecified: Secondary | ICD-10-CM | POA: Insufficient documentation

## 2017-06-09 DIAGNOSIS — Z7982 Long term (current) use of aspirin: Secondary | ICD-10-CM | POA: Diagnosis not present

## 2017-06-09 DIAGNOSIS — E119 Type 2 diabetes mellitus without complications: Secondary | ICD-10-CM | POA: Insufficient documentation

## 2017-06-09 DIAGNOSIS — R531 Weakness: Secondary | ICD-10-CM | POA: Diagnosis not present

## 2017-06-09 DIAGNOSIS — I10 Essential (primary) hypertension: Secondary | ICD-10-CM | POA: Insufficient documentation

## 2017-06-09 DIAGNOSIS — Z79899 Other long term (current) drug therapy: Secondary | ICD-10-CM | POA: Diagnosis not present

## 2017-06-09 DIAGNOSIS — M25561 Pain in right knee: Secondary | ICD-10-CM | POA: Diagnosis present

## 2017-06-09 DIAGNOSIS — Z7984 Long term (current) use of oral hypoglycemic drugs: Secondary | ICD-10-CM | POA: Insufficient documentation

## 2017-06-09 DIAGNOSIS — M79604 Pain in right leg: Secondary | ICD-10-CM

## 2017-06-09 DIAGNOSIS — M542 Cervicalgia: Secondary | ICD-10-CM | POA: Insufficient documentation

## 2017-06-09 MED ORDER — DEXAMETHASONE SODIUM PHOSPHATE 10 MG/ML IJ SOLN
10.0000 mg | Freq: Once | INTRAMUSCULAR | Status: AC
  Administered 2017-06-10: 10 mg via INTRAVENOUS
  Filled 2017-06-09: qty 1

## 2017-06-09 MED ORDER — KETOROLAC TROMETHAMINE 30 MG/ML IJ SOLN
15.0000 mg | Freq: Once | INTRAMUSCULAR | Status: AC
  Administered 2017-06-10: 15 mg via INTRAVENOUS
  Filled 2017-06-09: qty 1

## 2017-06-09 MED ORDER — METHOCARBAMOL 1000 MG/10ML IJ SOLN
1000.0000 mg | Freq: Once | INTRAVENOUS | Status: AC
  Administered 2017-06-10: 1000 mg via INTRAVENOUS
  Filled 2017-06-09: qty 10

## 2017-06-09 MED ORDER — METHOCARBAMOL 1000 MG/10ML IJ SOLN: 1000.0000 mg | Freq: Once | INTRAMUSCULAR | Status: DC

## 2017-06-09 NOTE — ED Provider Notes (Signed)
[facility] EMERGENCY DEPARTMENT Provider Note   CSN: [id number] Arrival date & time: 06/09/17  2023     History   Chief Complaint Chief Complaint  Patient presents with  . Knee Pain    HPI Tiffany Cardenas is a 67 y.o. female.  Tiffany Cardenas is a 67 y.o. Female with history of diabetes, DVT, gout, hypertension, hypothyroidism, generalized anxiety, panic attacks and depression, who presents to the emergency department for evaluation of pain that starts at the neck, goes down through the right side of her body and radiates through her leg causing pain in the hip, knee and foot.  Patient reports she has had a mild version of the symptoms for 2 weeks but over the past 48 hours pain has become increasingly more severe.  Patient reports pain started at her neck and right shoulder and she described as a pulling sensation, this pain then proceeded to radiate down through the right leg.  Patient reports today he feels like she cannot bear weight on the right leg and cannot fully support her weight on this leg.  She describes it as a heavy sensation and feeling as if it is asleep.  She reports it feels generally weak, denies any paresthesias.  Over the past few days she has had a few episodes of incontinence of urine, no history of stress incontinence, also reports she is been constipated for the past 5 days, finally took some Dulcolax and had small bowel movement.  No pain in the chest or shortness of breath, no abdominal pain or dysuria.  No fevers or chills, no history of cancer or IV drug use.  Patient does report history of prior L4-L5 laminectomy and decompression approximately 3 years ago.     Past Medical History:  Diagnosis Date  . Diabetes mellitus without complication (HCC)   . DVT (deep venous thrombosis) (HCC) 2017  . GAD (generalized anxiety disorder)   . Gout   . Hypertension   . Hypothyroidism   . Panic attacks   . Sciatica   . Thyroid disease     Patient  Active Problem List   Diagnosis Date Noted  . Withdrawal symptoms, drug or narcotic (HCC) 08/31/2016  . 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 MDD (major depressive disorder) 08/27/2016    Past Surgical History:  Procedure Laterality Date  . ABDOMINAL HYSTERECTOMY    . BACK SURGERY  2016  . CHOLECYSTECTOMY    . TONSILLECTOMY       OB History   None      Home Medications    Prior to Admission medications   Medication Sig Start Date End Date Taking? Authorizing Provider  aspirin 81 MG chewable tablet Chew 81 mg by mouth daily.    [provider]  Calcium Carb-Cholecalciferol (CALCIUM 600 + D PO) Take 1 tablet by mouth every morning.    [provider]  famotidine (PEPCID) 20 MG tablet Take 1 tablet (20 mg total) by mouth 2 (two) times daily for 5 days. 04/17/17 04/22/17  Joy, Shawn C, PA-C  gabapentin (NEURONTIN) 300 MG capsule Take 1 capsule (300 mg total) by mouth at bedtime. 03/08/17   Garey Ham, MD  L-Lysine HCl 500 MG CAPS Take 500 mg by mouth daily.    [provider]  lisinopril (PRINIVIL,ZESTRIL) 20 MG tablet Take 1 tablet (20 mg total) by mouth daily. 09/05/16   Denzil Magnuson, NP  LORazepam (ATIVAN) 1 MG tablet  Take 1 tablet (1 mg total) by mouth 3 (three) times daily as needed for anxiety. Patient taking differently: Take 1 mg by mouth 3 (three) times daily.  09/26/16   Mancel Bale, MD  metFORMIN (GLUCOPHAGE) 500 MG tablet Take 1 tablet (500 mg total) by mouth 2 (two) times daily with a meal. Patient taking differently: Take 500 mg by mouth daily with breakfast.  09/04/16   Denzil Magnuson, NP  methocarbamol (ROBAXIN) 500 MG tablet Take 500 mg by mouth 2 (two) times daily. 03/07/17   [provider]  pantoprazole (PROTONIX) 20 MG tablet Take 1 tablet (20 mg total) by mouth daily. 04/17/17 06/16/17  Joy, Shawn C, PA-C  senna-docusate (SENOKOT-S) 8.6-50 MG tablet Take 1 tablet by mouth  daily as needed for moderate constipation.  07/28/14   [provider]  thyroid (ARMOUR) 90 MG tablet Take 1 tablet (90 mg total) by mouth every morning. 09/04/16   Denzil Magnuson, NP    Family History Family History  Problem Relation Age of Onset  . Diabetes Father   . Heart attack Father   . Heart attack Paternal Uncle   . Sudden Cardiac Death Paternal Uncle     Social History Social History   Tobacco Use  . Smoking status: Never Smoker  . Smokeless tobacco: Never Used  Substance Use Topics  . Alcohol use: No  . Drug use: No     Allergies   Hydroxyzine; Penicillins; Sulfa antibiotics; Cymbalta [duloxetine hcl]; Hydrocodone; Ivp dye [iodinated diagnostic agents]; Chymopapain; Mango flavor; and Papaya derivatives   Review of Systems Review of Systems  Constitutional: Negative for chills and fever.  Eyes: Negative for visual disturbance.  Respiratory: Negative for shortness of breath.   Cardiovascular: Negative for chest pain.  Gastrointestinal: Positive for constipation. Negative for abdominal pain, nausea and vomiting.  Genitourinary: Negative for dysuria and frequency.  Musculoskeletal: Positive for arthralgias, back pain, gait problem, myalgias and neck pain. Negative for neck stiffness.  Skin: Negative for color change.  Neurological: Positive for weakness and numbness. Negative for dizziness, facial asymmetry, light-headedness and headaches.     Physical Exam Updated Vital Signs BP (!) 155/84 (BP Location: Right Arm)   Pulse 87   Temp 98.2 F (36.8 C) (Oral)   Resp 16   SpO2 98%   Physical Exam  Constitutional: She is oriented to person, place, and time. She appears well-developed and well-nourished. No distress.  HENT:  Head: Normocephalic and atraumatic.  Mouth/Throat: Oropharynx is clear and moist.  Eyes: Pupils are equal, round, and reactive to light. EOM are normal. Right eye exhibits no discharge. Left eye exhibits no discharge.  No  nystagmus  Neck: Normal range of motion. Neck supple.  Tenderness of the paraspinal muscles, primarily over the right neck, very minimal midline C-spine tenderness, range of motion intact in all directions with some discomfort  Cardiovascular: Normal rate, regular rhythm and normal heart sounds.  Pulses:      Radial pulses are 2+ on the right side, and 2+ on the left side.       Dorsalis pedis pulses are 2+ on the right side, and 2+ on the left side.       Posterior tibial pulses are 2+ on the right side, and 2+ on the left side.  Pulmonary/Chest: Effort normal and breath sounds normal. No stridor. No respiratory distress. She has no wheezes. She has no rales.  Abdominal: Soft. Bowel sounds are normal. She exhibits no distension and no mass. There is  no tenderness. There is no guarding.  Genitourinary:  Genitourinary Comments: Rectal tone intact  Musculoskeletal:  Tenderness to palpation over the right shoulder, right shoulder range of motion limited by pain, negative Spurling's maneuver.  Normal R OM of the elbow and wrist, normal grip strength bilaterally.  Right lower extremity range of motion significantly limited by pain, no erythema, warmth or appreciable swelling.  Right lower extremity is warm and well-perfused, all compartments soft.  No midline tenderness of the T spine, minimal midline tenderness of the L-spine.  Neurological: She is alert and oriented to person, place, and time. Coordination normal.  Speech is clear, able to follow commands CN III-XII intact Normal strength in upper extremities bilaterally, with 2+ reflexes. Pain with movement of the right hip and knee, 1-2+ Achilles and knee jerk reflexes bilaterally, no evidence of foot drop. Sensation normal to light and sharp touch in bilateral upper and lower extremities. Patient is hesitant to attempt to walk due to pain, unable to assess gait.  Skin: Skin is warm and dry. Capillary refill takes less than 2 seconds. She is not  diaphoretic.  Psychiatric: She has a normal mood and affect. Her behavior is normal.  Nursing note and vitals reviewed.    ED Treatments / Results  Labs (all labs ordered are listed, but only abnormal results are displayed) Labs Reviewed - No data to display  EKG None  Radiology Dg Knee Complete 4 Views Right  Result Date: 06/09/2017 CLINICAL DATA:  67 year old female with right knee pain and swelling and stiffness. No known injury. EXAM: RIGHT KNEE - COMPLETE 4+ VIEW COMPARISON:  None. FINDINGS: There is no acute fracture or dislocation. There is mild narrowing of the lateral compartment of the knee. No joint effusion. Mild diffuse subcutaneous edema. The soft tissues are otherwise unremarkable. IMPRESSION: No acute fracture or dislocation. Mild narrowing of the lateral compartment Electronically Signed   By: Elgie Collard M.D.   On: 06/09/2017 22:13    Procedures Procedures (including critical care time)  Medications Ordered in ED Medications  LORazepam (ATIVAN) injection 1 mg (has no administration in time range)  dexamethasone (DECADRON) injection 10 mg (10 mg Intravenous Given 06/10/17 0026)  ketorolac (TORADOL) 30 MG/ML injection 15 mg (15 mg Intravenous Given 06/10/17 0024)  methocarbamol (ROBAXIN) 1,000 mg in dextrose 5 % 50 mL IVPB (0 mg Intravenous Stopped 06/10/17 0130)  gabapentin (NEURONTIN) capsule 300 mg (300 mg Oral Given 06/10/17 0100)     Initial Impression / Assessment and Plan / ED Course  I have reviewed the triage vital signs and the nursing notes.  Pertinent labs & imaging results that were available during my care of the patient were reviewed by me and considered in my medical decision making (see chart for details).  Patient presents to the ED for evaluation of pain that starts at her neck and radiates down the right side of her body down her leg into her foot.  Patient also reports constipation and several episodes of urinary incontinence over the past 3 to 4  days.  She denies fevers, no history of cancer or IV drug use.  Given odd constellation of symptoms it is hard to place at one particular lesion in the spinal cord or brain.  Patient mildly hypertensive but vitals otherwise normal.  She has limited range of motion of the right shoulder and paraspinal neck tenderness on this side.  She has normal strength and sensation in bilateral upper extremities.  Right lower extremity with decreased range  of motion due to pain.  Patient appears slightly weaker on the right leg and proximal muscles, but has 5/5 dorsi and plantar flexion bilaterally.  Unable to assess gait as patient is hesitant to ambulate or bear any weight on the right leg, right leg is warm and well-perfused.  Normal rectal tone.  Patient discussed with Dr. Fayrene Fearing who saw and evaluated the patient as well, agrees that odd constellation of symptoms is difficult to place.  Will get post void residual, MRIs of the cervical and lumbar spine.  Will treat symptomatically with Decadron, Robaxin and Toradol.  Patient also requests her home dose of gabapentin she does not want to miss this.  Patient reports she requires Ativan during MRIs as she gets extremely claustrophobic, as needed order placed.  At shift change care signed out to Dr. Fayrene Fearing who will follow up on MRIs, reevaluate the patient and dispo appropriately.  Final Clinical Impressions(s) / ED Diagnoses   Final diagnoses:  Neck pain  Acute pain of right shoulder  Right leg pain  Weakness    ED Discharge Orders    None       Legrand Rams 06/10/17 Celestia Khat, MD 06/10/17 [phone]

## 2017-06-09 NOTE — ED Triage Notes (Signed)
Pt presents to the ED for assessment of right knee pain x 2 weeks, worsening the last 48 hours, with swelling, stiffness, intermittent hot patches (not hot to touch by this RN at this time), and numbness (to foot and upper mid thigh).  Patient states pain feels like it is radiating down from her neck.  Patient states hx of back surgery, hx of blood clots.

## 2017-06-09 NOTE — ED Notes (Signed)
One touch pt, see PA's assessment.

## 2017-06-09 NOTE — ED Provider Notes (Signed)
Patient seen and evaluated.  Discussed with PA.  Constellation of symptoms is hard to place at one particular lesion.  Started with some prespinal right neck and shoulder pain 5 or 6 days ago.  Now describes "pulling" in her right shoulder.  Pain moving her right shoulder and neck.  No left upper extremity symptoms.  Is not weak week with the arm.  States that it "radiates down".  She describes weakness of the leg.  States she cannot support her weight fully with the leg today.  States it is "asleep" but also states that I have normal feeling in it.  Denies paresthesias.  Feels generally weak to the right lower extremity.  Exam shows limited range of motion of the shoulder and tenderness in the paraspinal neck.  She does not have any findings with Spurling's, or lower meets maneuvers.  She has normal flexion-extension of the elbow and wrist.  Normal grip strength.  Normal AB and adduction of the fingers.  Normal sensation throughout the upper extremity normal reflexes.  She complains of pain with movement of the hip and knee.  Achilles and knee jerk reflexes are 1-2+ and symmetric bilaterally.  She does not have foot drop.  She is hesitant to attempt to ambulate because of knee and leg pain.  Leg is warm and well-perfused.  She states that she has had "some" incontinence.  Her husband actually interrupts and states "you did not tell me about that".  She states "just kind".  Was constipated for 5 days before having a bowel movement today.  Plan is a post void bladder scan, rectal exam, cervical and lumbar spine MRI.  Symptom medic treatment with steroid, muscle relaxant, anti-inflammatories.  Reevaluation after the above.   Rolland Porter, MD 06/09/17 2340

## 2017-06-10 ENCOUNTER — Encounter (HOSPITAL_COMMUNITY): Payer: Self-pay | Admitting: Radiology

## 2017-06-10 ENCOUNTER — Emergency Department (HOSPITAL_COMMUNITY): Payer: Medicaid - Out of State

## 2017-06-10 DIAGNOSIS — M542 Cervicalgia: Secondary | ICD-10-CM | POA: Diagnosis not present

## 2017-06-10 LAB — CBG MONITORING, ED: Glucose-Capillary: 146 mg/dL — ABNORMAL HIGH (ref 65–99)

## 2017-06-10 MED ORDER — METHYLPREDNISOLONE 4 MG PO TBPK: ORAL_TABLET | ORAL | 0 refills | Status: DC

## 2017-06-10 MED ORDER — METHOCARBAMOL 500 MG PO TABS: 500.0000 mg | ORAL_TABLET | Freq: Three times a day (TID) | ORAL | 0 refills | Status: DC | PRN

## 2017-06-10 MED ORDER — GABAPENTIN 300 MG PO CAPS
300.0000 mg | ORAL_CAPSULE | Freq: Once | ORAL | Status: AC
  Administered 2017-06-10: 300 mg via ORAL
  Filled 2017-06-10: qty 1

## 2017-06-10 MED ORDER — NAPROXEN 500 MG PO TABS: 500.0000 mg | ORAL_TABLET | Freq: Two times a day (BID) | ORAL | 0 refills | Status: DC

## 2017-06-10 MED ORDER — LORAZEPAM 1 MG PO TABS
1.0000 mg | ORAL_TABLET | ORAL | Status: DC | PRN
  Administered 2017-06-10: 1 mg via ORAL
  Filled 2017-06-10: qty 1

## 2017-06-10 MED ORDER — LORAZEPAM 2 MG/ML IJ SOLN
1.0000 mg | Freq: Once | INTRAMUSCULAR | Status: DC | PRN
  Filled 2017-06-10: qty 1

## 2017-06-10 MED ORDER — TRAMADOL HCL 50 MG PO TABS: 50.0000 mg | ORAL_TABLET | Freq: Four times a day (QID) | ORAL | 0 refills | Status: DC | PRN

## 2017-06-10 NOTE — ED Notes (Signed)
Pt requesting second dose of Ativan. Discussed with MD Effie Shy, will give PO PRN dose.

## 2017-06-10 NOTE — ED Notes (Signed)
MRI called about no lab values ordered/resulted for MRI w contrast. Verbal change for MRI w out obtained by MD Effie Shy.

## 2017-06-10 NOTE — ED Notes (Signed)
Patient transported to MRI 

## 2017-06-10 NOTE — ED Provider Notes (Signed)
MRI results returned, and are reassuring.  Dominant finding is degenerative changes, without evidence for acute spinal pathology or causative factors for spinal myelopathy.  Initial evaluation, and treatment plan are reasonable.  Patient informed of findings and agreeable to plan.  Results for orders placed or performed during the hospital encounter of 06/09/17  CBG monitoring, ED  Result Value Ref Range   Glucose-Capillary 146 (H) 65 - 99 mg/dL   Mr Cervical Spine Wo Contrast  Result Date: 06/10/2017 CLINICAL DATA:  67 year old female with right neck and shoulder pain beginning 5-6 days ago. No upper extremity weakness or contralateral left side symptoms. EXAM: MRI CERVICAL SPINE WITHOUT CONTRAST TECHNIQUE: Multiplanar, multisequence MR imaging of the cervical spine was performed. No intravenous contrast was administered. COMPARISON:  Brain MRI and intracranial MRA 02/06/2017. Cervical spine MRI 07/12/2014. FINDINGS: Alignment: Stable since 2016. Chronic straightening of cervical lordosis with multilevel subtle or trace anterolisthesis (C2 on C3, C5 on C6, C7 on T1). Vertebrae: No marrow edema or evidence of acute osseous abnormality. Visualized bone marrow signal is within normal limits. Cord: Spinal cord signal is within normal limits at all visualized levels. Posterior Fossa, vertebral arteries, paraspinal tissues: Cervicomedullary junction is within normal limits. New visible brain parenchyma. Preserved major vascular flow voids in the neck. Negative neck soft tissues. Negative visible thoracic inlet and lung apices. Disc levels: C2-C3: Moderate chronic left facet hypertrophy. Mild to moderate ligament flavum hypertrophy. No disc or endplate degeneration. Mild to moderate left C3 foraminal stenosis is stable since 2016. C3-C4: Moderate to severe left and mild-to-moderate right chronic facet hypertrophy. Mild ligament flavum hypertrophy. Minimal endplate spurring. No spinal stenosis. Mild to moderate  left and borderline to mild right C4 foraminal stenosis. This level is stable. C4-C5: Moderate to severe bilateral facet hypertrophy. Trace degenerative left facet joint fluid. Mild foraminal disc bulging and endplate spurring. No spinal stenosis. Mild bilateral C5 foraminal stenosis appears stable. C5-C6: Mild bilateral facet hypertrophy. Mild mostly far lateral disc bulging and endplate spurring. No stenosis. Subtle bilateral nerve root diverticula suspected (normal variant), and stable. C6-C7: Minimal disc bulge. Mild ligament flavum hypertrophy. No stenosis. Stable small chronic nerve root diverticula. C7-T1: Mild to moderate facet hypertrophy greater on the left. Mild ligament flavum hypertrophy. No stenosis. Mildly lobulated bilateral nerve root diverticula. This level is stable. No visible upper thoracic spinal stenosis. There is chronic upper thoracic facet and ligament flavum hypertrophy, moderate at the visible bilateral T2-T3 and T3-T4 levels. IMPRESSION: 1. No acute osseous abnormality or acute finding identified in the cervical spine. No cervical spinal stenosis. No progressive or significant right side neural impingement identified. 2. The dominant degenerative cervical finding is chronic facet arthropathy which occurs in the setting of chronic multilevel mild spondylolisthesis. Underlying chronic straightening of cervical lordosis. 3. There are chronic bilateral lower cervical nerve root diverticula, which are typically an inconsequential anatomic variation. Electronically Signed   By: Odessa Fleming M.D.   On: 06/10/2017 08:13   Mr Lumbar Spine Wo Contrast  Result Date: 06/10/2017 CLINICAL DATA:  She describes weakness of the leg. States she cannot support her weight fully with the leg today. States it is "asleep" but also states that I have normal feeling in it. Denies paresthesias. EXAM: MRI LUMBAR SPINE WITHOUT CONTRAST TECHNIQUE: Multiplanar, multisequence MR imaging of the lumbar spine was  performed. No intravenous contrast was administered. COMPARISON:  None. FINDINGS: Segmentation:  Standard. Alignment:  Minimal anterolisthesis of L4 on L5. Vertebrae:  No fracture, evidence of discitis, or bone  lesion. Conus medullaris and cauda equina: Conus extends to the T12 level. Conus and cauda equina appear normal. Paraspinal and other soft tissues: No acute paraspinal abnormality. Disc levels: Disc spaces: Disc desiccation throughout the lumbar spine. Mild disc height loss L4-5. T12-L1: No significant disc bulge. No evidence of neural foraminal stenosis. No central canal stenosis. L1-L2: No significant disc bulge. No evidence of neural foraminal stenosis. No central canal stenosis. L2-L3: No significant disc bulge. No evidence of neural foraminal stenosis. No central canal stenosis. L3-L4: Minimal broad-based disc bulge. Mild bilateral facet arthropathy. 4 mm right facet synovial cyst projecting into the right neural foramen versus a small perineural cyst unchanged from 07/12/2014. No evidence of neural foraminal stenosis. No central canal stenosis. L4-L5: Mild broad-based disc bulge. Small central disc protrusion. Moderate right and severe left facet arthropathy. No evidence of neural foraminal stenosis. No central canal stenosis. L5-S1: Mild broad-based disc bulge. Severe bilateral facet arthropathy. No evidence of neural foraminal stenosis. No central canal stenosis. IMPRESSION: 1. At L3-4 there is a minimal broad-based disc bulge. Mild bilateral facet arthropathy. 4 mm right facet synovial cyst projecting into the right neural foramen versus a small perineural cyst unchanged from 07/12/2014. 2. At L4-5 there is a mild broad-based disc bulge. Small central disc protrusion. Moderate right and severe left facet arthropathy. Electronically Signed   By: Elige Ko   On: 06/10/2017 09:06   Dg Knee Complete 4 Views Right  Result Date: 06/09/2017 CLINICAL DATA:  67 year old female with right knee pain and  swelling and stiffness. No known injury. EXAM: RIGHT KNEE - COMPLETE 4+ VIEW COMPARISON:  None. FINDINGS: There is no acute fracture or dislocation. There is mild narrowing of the lateral compartment of the knee. No joint effusion. Mild diffuse subcutaneous edema. The soft tissues are otherwise unremarkable. IMPRESSION: No acute fracture or dislocation. Mild narrowing of the lateral compartment Electronically Signed   By: Elgie Collard M.D.   On: 06/09/2017 22:13     Mancel Bale, MD 06/10/17 1530

## 2017-06-10 NOTE — Discharge Instructions (Signed)
Medications as prescribed. Follow up with your primary care physician.

## 2017-06-10 NOTE — ED Notes (Signed)
ED Provider at bedside. 

## 2017-06-19 ENCOUNTER — Ambulatory Visit (HOSPITAL_COMMUNITY)
Admission: EM | Admit: 2017-06-19 | Discharge: 2017-06-19 | Disposition: A | Discharge status: Home / Self Care | Payer: Medicaid - Out of State | Attending: Family Medicine | Admitting: Family Medicine

## 2017-06-19 ENCOUNTER — Emergency Department (HOSPITAL_COMMUNITY)
Admission: EM | Admit: 2017-06-19 | Discharge: 2017-06-20 | Disposition: A | Discharge status: Home / Self Care | Payer: Medicaid - Out of State | Attending: Emergency Medicine | Admitting: Emergency Medicine

## 2017-06-19 ENCOUNTER — Encounter (HOSPITAL_COMMUNITY): Payer: Self-pay

## 2017-06-19 ENCOUNTER — Other Ambulatory Visit: Payer: Self-pay

## 2017-06-19 ENCOUNTER — Encounter (HOSPITAL_COMMUNITY): Payer: Self-pay | Admitting: Emergency Medicine

## 2017-06-19 DIAGNOSIS — G43909 Migraine, unspecified, not intractable, without status migrainosus: Secondary | ICD-10-CM | POA: Insufficient documentation

## 2017-06-19 DIAGNOSIS — R202 Paresthesia of skin: Secondary | ICD-10-CM | POA: Diagnosis not present

## 2017-06-19 DIAGNOSIS — Z7982 Long term (current) use of aspirin: Secondary | ICD-10-CM | POA: Diagnosis not present

## 2017-06-19 DIAGNOSIS — E039 Hypothyroidism, unspecified: Secondary | ICD-10-CM | POA: Insufficient documentation

## 2017-06-19 DIAGNOSIS — E119 Type 2 diabetes mellitus without complications: Secondary | ICD-10-CM | POA: Diagnosis not present

## 2017-06-19 DIAGNOSIS — G43109 Migraine with aura, not intractable, without status migrainosus: Secondary | ICD-10-CM

## 2017-06-19 DIAGNOSIS — Z86718 Personal history of other venous thrombosis and embolism: Secondary | ICD-10-CM | POA: Insufficient documentation

## 2017-06-19 DIAGNOSIS — H53149 Visual discomfort, unspecified: Secondary | ICD-10-CM | POA: Diagnosis not present

## 2017-06-19 DIAGNOSIS — Z7984 Long term (current) use of oral hypoglycemic drugs: Secondary | ICD-10-CM | POA: Diagnosis not present

## 2017-06-19 DIAGNOSIS — R200 Anesthesia of skin: Secondary | ICD-10-CM | POA: Diagnosis not present

## 2017-06-19 DIAGNOSIS — Z79899 Other long term (current) drug therapy: Secondary | ICD-10-CM | POA: Insufficient documentation

## 2017-06-19 DIAGNOSIS — M79604 Pain in right leg: Secondary | ICD-10-CM

## 2017-06-19 DIAGNOSIS — R531 Weakness: Secondary | ICD-10-CM

## 2017-06-19 DIAGNOSIS — I10 Essential (primary) hypertension: Secondary | ICD-10-CM | POA: Diagnosis not present

## 2017-06-19 DIAGNOSIS — M6281 Muscle weakness (generalized): Secondary | ICD-10-CM | POA: Insufficient documentation

## 2017-06-19 DIAGNOSIS — R4182 Altered mental status, unspecified: Secondary | ICD-10-CM | POA: Diagnosis present

## 2017-06-19 MED ORDER — DICLOFENAC SODIUM 75 MG PO TBEC: 75.0000 mg | DELAYED_RELEASE_TABLET | Freq: Two times a day (BID) | ORAL | 0 refills | Status: DC

## 2017-06-19 MED ORDER — KETOROLAC TROMETHAMINE 60 MG/2ML IM SOLN
60.0000 mg | Freq: Once | INTRAMUSCULAR | Status: AC
  Administered 2017-06-19: 60 mg via INTRAMUSCULAR

## 2017-06-19 MED ORDER — LISINOPRIL 20 MG PO TABS: 20.0000 mg | ORAL_TABLET | Freq: Every day | ORAL | 0 refills | Status: DC

## 2017-06-19 MED ORDER — PREDNISONE 10 MG (21) PO TBPK: ORAL_TABLET | Freq: Every day | ORAL | 0 refills | Status: DC

## 2017-06-19 MED ORDER — KETOROLAC TROMETHAMINE 60 MG/2ML IM SOLN
INTRAMUSCULAR | Status: AC
  Filled 2017-06-19: qty 2

## 2017-06-19 MED ORDER — LORAZEPAM 1 MG PO TABS: 1.0000 mg | ORAL_TABLET | Freq: Three times a day (TID) | ORAL | 0 refills | Status: AC | PRN

## 2017-06-19 MED ORDER — LORAZEPAM 1 MG PO TABS: 1.0000 mg | ORAL_TABLET | Freq: Three times a day (TID) | ORAL | 0 refills | Status: DC | PRN

## 2017-06-19 NOTE — Discharge Instructions (Signed)
Please begin prednisone taper over the next 2 weeks.  Please take as prescribed.  Please use diclofenac twice daily as well.  Please go to emergency room if you have worsening weakness, change in vision, more frequent episodes of passing out, worsening pain, difficulty speaking.  I have refilled your Ativan.

## 2017-06-19 NOTE — ED Triage Notes (Signed)
Pt reports that for the past two weeks she has been having R sided leg tingling that she went to UC for today and told her may have had a TIA, today an hour ago she reports that she has R arm tingling, weakness and bilateral lip numbness with a headache, LSN 1030?  Weakness in R arm, no drift. Pt very anxious

## 2017-06-19 NOTE — ED Triage Notes (Signed)
The patient presented to the [facility] with a complaint of right knee and leg pain that she reported as a tingling feeling that starts in her right hip area.

## 2017-06-19 NOTE — ED Provider Notes (Signed)
[facility]    CSN: [id number] Arrival date & time: 06/19/17  1011     History   Chief Complaint Chief Complaint  Patient presents with  . Knee Pain    HPI Tiffany Cardenas is a 67 y.o. female history of thyroid disease, hypertension, previous DVT, diabetes mellitus type 2, generalized anxiety presenting today for evaluation of right-sided leg pain and weakness.  Patient states that over the past 3 to 4 weeks she has had the sensation that her right leg has been asleep.  States that her pain initially began in her upper abdomen/back and has gradually gone further down into her right leg extending all the way to her feet.  She denies numbness, but states that she always has the tingling sensation that her leg is asleep and is never woken up.  Feels like her leg is dead weight.  Also feels like her right quadricep is always contracted and never relaxes.  Has headaches on and off, occasionally will have spots in her vision, but denies any loss of vision or persistent changes in vision.  Patient was seen in the emergency room on 5/5 for similar symptoms, work-up included lumbar and cervical MRI (results below)-does show 4 mm cyst on right side near L4, otherwise normal arthritic/degenerative changes..  Patient denies any loss of bowel or bladder control.  Denies any saddle anesthesia.  Denies any chest pain or shortness of breath.  Denies any nausea or vomiting.  Tolerating oral intake like normal, eating does not worsen pain at all.  Patient does note 2 episodes of syncope, last episode was approximately 2 weeks ago.  She was sitting down and all of a sudden felt faint.  MR LUMBAR SPINE WO CONTRAST CLINICAL DATA:  She describes weakness of the leg. States she cannot support her weight fully with the leg today. States it is "asleep" but also states that I have normal feeling in it. Denies paresthesias.  EXAM: MRI LUMBAR SPINE WITHOUT CONTRAST  TECHNIQUE: Multiplanar, multisequence  MR imaging of the lumbar spine was performed. No intravenous contrast was administered.  COMPARISON:  None.  FINDINGS: Segmentation:  Standard.  Alignment:  Minimal anterolisthesis of L4 on L5.  Vertebrae:  No fracture, evidence of discitis, or bone lesion.  Conus medullaris and cauda equina: Conus extends to the T12 level. Conus and cauda equina appear normal.  Paraspinal and other soft tissues: No acute paraspinal abnormality.  Disc levels:  Disc spaces: Disc desiccation throughout the lumbar spine. Mild disc height loss L4-5.  T12-L1: No significant disc bulge. No evidence of neural foraminal stenosis. No central canal stenosis.  L1-L2: No significant disc bulge. No evidence of neural foraminal stenosis. No central canal stenosis.  L2-L3: No significant disc bulge. No evidence of neural foraminal stenosis. No central canal stenosis.  L3-L4: Minimal broad-based disc bulge. Mild bilateral facet arthropathy. 4 mm right facet synovial cyst projecting into the right neural foramen versus a small perineural cyst unchanged from 07/12/2014. No evidence of neural foraminal stenosis. No central canal stenosis.  L4-L5: Mild broad-based disc bulge. Small central disc protrusion. Moderate right and severe left facet arthropathy. No evidence of neural foraminal stenosis. No central canal stenosis.  L5-S1: Mild broad-based disc bulge. Severe bilateral facet arthropathy. No evidence of neural foraminal stenosis. No central canal stenosis.  IMPRESSION: 1. At L3-4 there is a minimal broad-based disc bulge. Mild bilateral facet arthropathy. 4 mm right facet synovial cyst projecting into the right neural foramen versus a small perineural  cyst unchanged from 07/12/2014. 2. At L4-5 there is a mild broad-based disc bulge. Small central disc protrusion. Moderate right and severe left facet arthropathy.  Electronically Signed   By: Elige Ko   On: 06/10/2017 09:06 MR Cervical  Spine Wo Contrast CLINICAL DATA:  67 year old female with right neck and shoulder pain beginning 5-6 days ago. No upper extremity weakness or contralateral left side symptoms.  EXAM: MRI CERVICAL SPINE WITHOUT CONTRAST  TECHNIQUE: Multiplanar, multisequence MR imaging of the cervical spine was performed. No intravenous contrast was administered.  COMPARISON:  Brain MRI and intracranial MRA 02/06/2017. Cervical spine MRI 07/12/2014.  FINDINGS: Alignment: Stable since 2016. Chronic straightening of cervical lordosis with multilevel subtle or trace anterolisthesis (C2 on C3, C5 on C6, C7 on T1).  Vertebrae: No marrow edema or evidence of acute osseous abnormality. Visualized bone marrow signal is within normal limits.  Cord: Spinal cord signal is within normal limits at all visualized levels.  Posterior Fossa, vertebral arteries, paraspinal tissues: Cervicomedullary junction is within normal limits. New visible brain parenchyma. Preserved major vascular flow voids in the neck. Negative neck soft tissues. Negative visible thoracic inlet and lung apices.  Disc levels:  C2-C3: Moderate chronic left facet hypertrophy. Mild to moderate ligament flavum hypertrophy. No disc or endplate degeneration. Mild to moderate left C3 foraminal stenosis is stable since 2016.  C3-C4: Moderate to severe left and mild-to-moderate right chronic facet hypertrophy. Mild ligament flavum hypertrophy. Minimal endplate spurring. No spinal stenosis. Mild to moderate left and borderline to mild right C4 foraminal stenosis. This level is stable.  C4-C5: Moderate to severe bilateral facet hypertrophy. Trace degenerative left facet joint fluid. Mild foraminal disc bulging and endplate spurring. No spinal stenosis. Mild bilateral C5 foraminal stenosis appears stable.  C5-C6: Mild bilateral facet hypertrophy. Mild mostly far lateral disc bulging and endplate spurring. No stenosis. Subtle bilateral nerve  root diverticula suspected (normal variant), and stable.  C6-C7: Minimal disc bulge. Mild ligament flavum hypertrophy. No stenosis. Stable small chronic nerve root diverticula.  C7-T1: Mild to moderate facet hypertrophy greater on the left. Mild ligament flavum hypertrophy. No stenosis. Mildly lobulated bilateral nerve root diverticula. This level is stable.  No visible upper thoracic spinal stenosis. There is chronic upper thoracic facet and ligament flavum hypertrophy, moderate at the visible bilateral T2-T3 and T3-T4 levels.  IMPRESSION: 1. No acute osseous abnormality or acute finding identified in the cervical spine. No cervical spinal stenosis. No progressive or significant right side neural impingement identified. 2. The dominant degenerative cervical finding is chronic facet arthropathy which occurs in the setting of chronic multilevel mild spondylolisthesis. Underlying chronic straightening of cervical lordosis. 3. There are chronic bilateral lower cervical nerve root diverticula, which are typically an inconsequential anatomic variation.  Electronically Signed   By: Odessa Fleming M.D.   On: 06/10/2017 08:13    HPI  Past Medical History:  Diagnosis Date  . Diabetes mellitus without complication (HCC)   . DVT (deep venous thrombosis) (HCC) 2017  . GAD (generalized anxiety disorder)   . Gout   . Hypertension   . Hypothyroidism   . Panic attacks   . Sciatica   . Thyroid disease     Patient Active Problem List   Diagnosis Date Noted  . Withdrawal symptoms, drug or narcotic (HCC) 08/31/2016  . 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 MDD (major depressive disorder) 08/27/2016    Past Surgical History:  Procedure Laterality Date  . ABDOMINAL HYSTERECTOMY    .  BACK SURGERY  2016  . CHOLECYSTECTOMY    . TONSILLECTOMY      OB History   None      Home Medications    Prior to Admission medications   Medication  Sig Start Date End Date Taking? Authorizing Provider  aspirin 81 MG chewable tablet Chew 81 mg by mouth daily.    [provider]  Calcium Carb-Cholecalciferol (CALCIUM 600 + D PO) Take 1 tablet by mouth daily.     [provider]  diclofenac (VOLTAREN) 75 MG EC tablet Take 1 tablet (75 mg total) by mouth 2 (two) times daily. 06/19/17   Tymothy Cass C, PA-C  gabapentin (NEURONTIN) 300 MG capsule Take 1 capsule (300 mg total) by mouth at bedtime. 03/08/17   Garey Ham, MD  L-Lysine HCl 500 MG CAPS Take 500 mg by mouth daily.    [provider]  lisinopril (PRINIVIL,ZESTRIL) 20 MG tablet Take 1 tablet (20 mg total) by mouth daily. 06/19/17   Selim Durden C, PA-C  LORazepam (ATIVAN) 1 MG tablet Take 1 tablet (1 mg total) by mouth every 8 (eight) hours as needed for up to 14 days for anxiety. 06/19/17 07/03/17  Lucianna Ostlund C, PA-C  metFORMIN (GLUCOPHAGE) 500 MG tablet Take 1 tablet (500 mg total) by mouth 2 (two) times daily with a meal. Patient taking differently: Take 500 mg by mouth daily with breakfast.  09/04/16   Denzil Magnuson, NP  methocarbamol (ROBAXIN) 500 MG tablet Take 1 tablet (500 mg total) by mouth 3 (three) times daily between meals as needed. 06/10/17   Rolland Porter, MD  methylPREDNISolone (MEDROL DOSEPAK) 4 MG TBPK tablet 6 po on day 1, decrease by 1 tab per day 06/10/17   Rolland Porter, MD  pantoprazole (PROTONIX) 20 MG tablet Take 1 tablet (20 mg total) by mouth daily. Patient not taking: Reported on 06/10/2017 04/17/17 06/16/17  Joy, Hillard Danker, PA-C  predniSONE (STERAPRED UNI-PAK 21 TAB) 10 MG (21) TBPK tablet Take by mouth daily. Take 6 tabs by mouth daily  for 2 days, then 5 tabs for 2 days, then 4 tabs for 2 days, then 3 tabs for 2 days, 2 tabs for 2 days, then 1 tab by mouth daily for 2 days 06/19/17   Daisja Kessinger C, PA-C  thyroid (ARMOUR) 90 MG tablet Take 1 tablet (90 mg total) by mouth every morning. Patient taking differently: Take 90 mg by mouth  daily.  09/04/16   Denzil Magnuson, NP    Family History Family History  Problem Relation Age of Onset  . Diabetes Father   . Heart attack Father   . Heart attack Paternal Uncle   . Sudden Cardiac Death Paternal Uncle     Social History Social History   Tobacco Use  . Smoking status: Never Smoker  . Smokeless tobacco: Never Used  Substance Use Topics  . Alcohol use: No  . Drug use: No     Allergies   Hydroxyzine; Penicillins; Sulfa antibiotics; Cymbalta [duloxetine hcl]; Hydrocodone; Ivp dye [iodinated diagnostic agents]; Chymopapain; Mango flavor; and Papaya derivatives   Review of Systems Review of Systems  Constitutional: Negative for activity change, appetite change, fatigue and fever.  HENT: Negative for trouble swallowing.   Eyes: Negative for pain and visual disturbance.  Respiratory: Negative for shortness of breath.   Cardiovascular: Negative for chest pain.  Gastrointestinal: Negative for abdominal pain, nausea and vomiting.  Genitourinary: Negative for difficulty urinating.       Denies incontinence  of bowel bladder  Musculoskeletal: Positive for arthralgias, back pain, gait problem and myalgias. Negative for neck pain and neck stiffness.  Skin: Negative for color change and wound.  Neurological: Positive for syncope, weakness, numbness and headaches. Negative for dizziness, seizures and light-headedness.     Physical Exam Triage Vital Signs ED Triage Vitals  Enc Vitals Group     BP 06/19/17 1029 136/72     Pulse Rate 06/19/17 1029 72     Resp 06/19/17 1029 18     Temp 06/19/17 1029 98 F (36.7 C)     Temp Source 06/19/17 1029 Oral     SpO2 06/19/17 1029 96 %     Weight --      Height --      Head Circumference --      Peak Flow --      Pain Score 06/19/17 1027 5     Pain Loc --      Pain Edu? --      Excl. in GC? --    No data found.  Updated Vital Signs BP 136/72 (BP Location: Left Arm)   Pulse 72   Temp 98 F (36.7 C) (Oral)   Resp  18   SpO2 96%   Visual Acuity Right Eye Distance:   Left Eye Distance:   Bilateral Distance:    Right Eye Near:   Left Eye Near:    Bilateral Near:     Physical Exam  Constitutional: She appears well-developed and well-nourished. No distress.  HENT:  Head: Normocephalic and atraumatic.  Eyes: Conjunctivae are normal.  Neck: Neck supple.  Cardiovascular: Normal rate and regular rhythm.  No murmur heard. Pulmonary/Chest: Effort normal and breath sounds normal. No respiratory distress.  Breathing comfortably at rest, CTABL, no wheezing, rales or other adventitious sounds auscultated   Abdominal: Soft. There is no tenderness.  Abdomen is soft, nondistended, nontender light deep palpation throughout all 4 quadrants  Musculoskeletal: She exhibits no edema.  Tenderness diffusely throughout lumbar spine and bilateral lumbar musculature, worse on right side with palpation.significant pain elicited with SLR on right.   Neurological: She is alert.  Patient A&O x3, cranial nerves II-XII grossly intact, strength at shoulders 5/5 hips 4/5 on the right compared to left, knees 5/5, equal bilaterally, patellar reflex 1+ on right side, 2+ on left. Normal Finger to nose, RAM and heel to shin.  Patient did have significant swaying with Romberg, the patient also balancing more on left leg to avoid pressure on right.  Ambulating with antalgic gait favoring the left side.   Skin: Skin is warm and dry.  Psychiatric: She has a normal mood and affect.  Nursing note and vitals reviewed.    UC Treatments / Results  Labs (all labs ordered are listed, but only abnormal results are displayed) Labs Reviewed - No data to display  EKG None  Radiology No results found.  Procedures Procedures (including critical care time)  Medications Ordered in UC Medications  ketorolac (TORADOL) injection 60 mg (60 mg Intramuscular Given 06/19/17 1151)    Initial Impression / Assessment and Plan / UC Course  I  have reviewed the triage vital signs and the nursing notes.  Pertinent labs & imaging results that were available during my care of the patient were reviewed by me and considered in my medical decision making (see chart for details).     Patient symptoms seem to line up more slightly with the radiculopathy secondary to back pain.  No red  flags.  Neurologic exam appears slightly abnormal, Romberg likely related to trying to balance on left foot and avoiding bearing weight on the right.  Recent MRI, no acute changes since then.  Will provide patient with Toradol injection clinic today, will provide prednisone taper.  Patient requested refill of Ativan as she is visiting from [place] for 2 weeks and left her medication at home as well as her lisinopril. Discussed strict return precautions. Patient verbalized understanding and is agreeable with plan.  Discussed case with Dr. Tracie Harrier.   Final Clinical Impressions(s) / UC Diagnoses   Final diagnoses:  Right leg pain  Weakness     Discharge Instructions     Please begin prednisone taper over the next 2 weeks.  Please take as prescribed.  Please use diclofenac twice daily as well.  Please go to emergency room if you have worsening weakness, change in vision, more frequent episodes of passing out, worsening pain, difficulty speaking.  I have refilled your Ativan.   ED Prescriptions    Medication Sig Dispense Auth. Provider   predniSONE (STERAPRED UNI-PAK 21 TAB) 10 MG (21) TBPK tablet Take by mouth daily. Take 6 tabs by mouth daily  for 2 days, then 5 tabs for 2 days, then 4 tabs for 2 days, then 3 tabs for 2 days, 2 tabs for 2 days, then 1 tab by mouth daily for 2 days 42 tablet Ayn Domangue C, PA-C   diclofenac (VOLTAREN) 75 MG EC tablet  (Status: Discontinued) Take 1 tablet (75 mg total) by mouth 2 (two) times daily. 30 tablet Keymani Mclean C, PA-C   LORazepam (ATIVAN) 1 MG tablet  (Status: Discontinued) Take 1 tablet (1 mg total) by  mouth every 8 (eight) hours as needed for up to 14 days for anxiety. 42 tablet Jaekwon Mcclune C, PA-C   diclofenac (VOLTAREN) 75 MG EC tablet Take 1 tablet (75 mg total) by mouth 2 (two) times daily. 30 tablet Mason Dibiasio C, PA-C   LORazepam (ATIVAN) 1 MG tablet Take 1 tablet (1 mg total) by mouth every 8 (eight) hours as needed for up to 14 days for anxiety. 42 tablet Kathelyn Gombos C, PA-C   lisinopril (PRINIVIL,ZESTRIL) 20 MG tablet Take 1 tablet (20 mg total) by mouth daily. 30 tablet Johnasia Liese, [place] C, PA-C     Controlled Substance Prescriptions [place] Controlled Substance Registry consulted? Yes, I have consulted the [place] Controlled Substances Registry for this patient, and feel the risk/benefit ratio today is favorable for proceeding with this prescription for a controlled substance.   Lew Dawes, [place] 06/19/17 [phone]

## 2017-06-20 ENCOUNTER — Emergency Department (HOSPITAL_COMMUNITY): Payer: Medicaid - Out of State

## 2017-06-20 DIAGNOSIS — R200 Anesthesia of skin: Secondary | ICD-10-CM

## 2017-06-20 DIAGNOSIS — G43909 Migraine, unspecified, not intractable, without status migrainosus: Secondary | ICD-10-CM | POA: Diagnosis not present

## 2017-06-20 LAB — COMPREHENSIVE METABOLIC PANEL
ALBUMIN: 3.8 g/dL (ref 3.5–5.0)
ALK PHOS: 72 U/L (ref 38–126)
ALT: 21 U/L (ref 14–54)
AST: 22 U/L (ref 15–41)
Anion gap: 9 (ref 5–15)
BUN: 27 mg/dL — ABNORMAL HIGH (ref 6–20)
CALCIUM: 8.9 mg/dL (ref 8.9–10.3)
CO2: 23 mmol/L (ref 22–32)
CREATININE: 1.09 mg/dL — AB (ref 0.44–1.00)
Chloride: 105 mmol/L (ref 101–111)
GFR calc Af Amer: >60 mL/min (ref >60)
GFR calc non Af Amer: 52 mL/min — ABNORMAL LOW (ref >60)
GLUCOSE: 107 mg/dL — AB (ref 65–99)
Potassium: 3.9 mmol/L (ref 3.5–5.1)
Sodium: 137 mmol/L (ref 135–145)
Total Bilirubin: 0.5 mg/dL (ref 0.3–1.2)
Total Protein: 6.8 g/dL (ref 6.5–8.1)

## 2017-06-20 LAB — CBC
HEMATOCRIT: 40.7 % (ref 36.0–46.0)
Hemoglobin: 13.9 g/dL (ref 12.0–15.0)
MCH: 32.6 pg (ref 26.0–34.0)
MCHC: 34.2 g/dL (ref 30.0–36.0)
MCV: 95.3 fL (ref 78.0–100.0)
Platelets: 194 10*3/uL (ref 150–400)
RBC: 4.27 MIL/uL (ref 3.87–5.11)
RDW: 12.1 % (ref 11.5–15.5)
WBC: 6.4 10*3/uL (ref 4.0–10.5)

## 2017-06-20 LAB — PROTIME-INR
INR: 0.97
Prothrombin Time: 12.8 seconds (ref 11.4–15.2)

## 2017-06-20 LAB — I-STAT CHEM 8, ED
BUN: 31 mg/dL — ABNORMAL HIGH (ref 6–20)
Calcium, Ion: 1.14 mmol/L — ABNORMAL LOW (ref 1.15–1.40)
Chloride: 102 mmol/L (ref 101–111)
Creatinine, Ser: 1.1 mg/dL — ABNORMAL HIGH (ref 0.44–1.00)
GLUCOSE: 101 mg/dL — AB (ref 65–99)
HCT: 35 % — ABNORMAL LOW (ref 36.0–46.0)
HEMOGLOBIN: 11.9 g/dL — AB (ref 12.0–15.0)
POTASSIUM: 4 mmol/L (ref 3.5–5.1)
Sodium: 136 mmol/L (ref 135–145)
TCO2: 24 mmol/L (ref 22–32)

## 2017-06-20 LAB — DIFFERENTIAL
Abs Immature Granulocytes: 0.1 10*3/uL (ref 0.0–0.1)
Basophils Absolute: 0.1 10*3/uL (ref 0.0–0.1)
Basophils Relative: 1 %
EOS PCT: 7 %
Eosinophils Absolute: 0.5 10*3/uL (ref 0.0–0.7)
Immature Granulocytes: 2 %
LYMPHS ABS: 2.4 10*3/uL (ref 0.7–4.0)
LYMPHS PCT: 37 %
Monocytes Absolute: 0.5 10*3/uL (ref 0.1–1.0)
Monocytes Relative: 8 %
NEUTROS ABS: 2.9 10*3/uL (ref 1.7–7.7)
Neutrophils Relative %: 45 %

## 2017-06-20 LAB — APTT: aPTT: 30 seconds (ref 24–36)

## 2017-06-20 LAB — I-STAT TROPONIN, ED: Troponin i, poc: 0.02 ng/mL (ref 0.00–0.08)

## 2017-06-20 LAB — ETHANOL

## 2017-06-20 MED ORDER — DIAZEPAM 5 MG PO TABS
10.0000 mg | ORAL_TABLET | Freq: Once | ORAL | Status: AC
  Administered 2017-06-20: 10 mg via ORAL
  Filled 2017-06-20: qty 2

## 2017-06-20 MED ORDER — GABAPENTIN 300 MG PO CAPS: 300.0000 mg | ORAL_CAPSULE | Freq: Two times a day (BID) | ORAL | 0 refills | Status: DC

## 2017-06-20 NOTE — Consult Note (Addendum)
Neurology Consultation Reason for Consult: Right-sided numbness Referring Physician: Horton, C  CC: Right-sided numbness  History is obtained from: Patient  HPI: Tiffany Cardenas is a 67 y.o. female with a history of right-sided weakness and numbness since at least October of last year.  Since then she has had multiple evaluations including MRI of her cervical and lumbar spine, MRI of her brain, EMG at [facility].  It was felt at this time, there was no clear neurological reason for her symptoms, and it was opposed that may be as rheumatological in nature(e.g. pain driven).   Since that time, she persistent having numbness on the right side of her abdomen, numbness/weakness of right leg, and numbness of her right arm.  Today, she states that she had a headache, had some spots in her vision, has photophobia, and has some numbness of her lower lip.  This numbness is bilateral, but since it was a new symptom for her she sought care in the emergency department where a code stroke was activated.  She also complains of acute onset midthoracic pain with this episode.  LKW: Unclear, she has had symptoms for months tpa given?: no, outside of window  ROS: A 14 point ROS was performed and is negative except as noted in the HPI.  Past Medical History:  Diagnosis Date  . Diabetes mellitus without complication (HCC)   . DVT (deep venous thrombosis) (HCC) 2017  . GAD (generalized anxiety disorder)   . Gout   . Hypertension   . Hypothyroidism   . Panic attacks   . Sciatica   . Thyroid disease      Family History  Problem Relation Age of Onset  . Diabetes Father   . Heart attack Father   . Heart attack Paternal Uncle   . Sudden Cardiac Death Paternal Uncle      Social History:  reports that she has never smoked. She has never used smokeless tobacco. She reports that she does not drink alcohol or use drugs.   Exam: Current vital signs: BP (!) 153/76   Pulse 68   Temp 98.2 F (36.8 C)    Resp 15   SpO2 99%  Vital signs in last 24 hours: Temp:  [98 F (36.7 C)-98.3 F (36.8 C)] 98.2 F (36.8 C) (05/16 0120) Pulse Rate:  [60-103] 68 (05/16 0130) Resp:  [12-20] 15 (05/16 0130) BP: (136-162)/(63-93) 153/76 (05/16 0130) SpO2:  [96 %-100 %] 99 % (05/16 0130)   Physical Exam  Constitutional: Appears well-developed and well-nourished.  Psych: Affect appropriate to situation Eyes: No scleral injection HENT: No OP obstrucion Head: Normocephalic.  Cardiovascular: Normal rate and regular rhythm.  Respiratory: Effort normal, non-labored breathing GI: Soft.  No distension. There is no tenderness.  Skin: WDI  Neuro: Mental Status: Patient is awake, alert, oriented to person, place, month, year, and situation. Patient is able to give a clear and coherent history. No signs of aphasia or neglect Cranial Nerves: II: Visual Fields are full. Pupils are equal, round, and reactive to light.   III,IV, VI: EOMI without ptosis or diploplia.  V: Facial sensation is symmetric to temperature VII: Facial movement is symmetric.  VIII: hearing is intact to voice X: Uvula elevates symmetrically XI: Shoulder shrug is symmetric. XII: tongue is midline without atrophy or fasciculations.  Motor: Tone is normal. Bulk is normal.  She has 4+/5 strength of the right arm and leg, but there is a Cytogeneticist.  When checking drift, she has a downward drift  without pronation. Sensory: Sensation is diminished on the right side Cerebellar: She has no clear ataxia.   I have reviewed labs in epic and the results pertinent to this consultation are: CMP-unremarkable  I have reviewed the images obtained: CT head is unremarkable  Impression: 67 year old with photophobic headache associated with perioral numbness/tingling.  This is fairly common with migraine and I suspect that her current symptoms could be due to complicated migraine.  The right sided long-standing numbness, however I am unsure  what to make of but given that she has had extensive outpatient work-up, I would defer this to her outpatient neurologist.  With the acute change in her thoracic pain, however, it is reasonable to check imaging here as well.  Recommendations: 1) MRI brain and thoracic spine 2) no further work-up if this is negative 3) consider treating as complicated migraine.   Ritta Slot, MD [facility] [phone]  If 7pm- 7am, please page neurology on call as listed in AMION.

## 2017-06-20 NOTE — ED Provider Notes (Signed)
Patient placed in Quick Look pathway, seen and evaluated   Chief Complaint: Numbness  HPI:   Tiffany Cardenas is a 67 y.o. female, presenting to the ED with onset of what she calls numbness in the right arm and circumoral.  States she was normal at 10:30 PM and thinks symptoms began around 10:45 PM.  She states her right arm feels heavy. Denies falls/head injury, dizziness, vision deficit, facial droop.  ROS: Right arm numbness (one)  Physical Exam:   Gen: No distress  Neuro: Awake and Alert  Skin: Warm    Focused Exam:   No diaphoresis.  No pallor.  Pulmonary: No increased work of breathing.  Speaks in full sentences without difficulty.  Lung sounds clear.  No tachypnea.  Cardiac: Patient slightly tachycardic. Peripheral pulses intact.  Neurologic:   Patient indicates paresthesias in the right arm and complete circumoral regions, but not to the rest of the face. No noted speech deficits. No aphasia. Patient handles oral secretions without difficulty. No noted swallowing defects. Questionable grip strength weakness on the right. No notable arm drift. Strength 5/5 and equal in the bilateral lower extremities. Cranial nerves III-XII grossly intact.  No facial droop.   MSK: No peripheral edema.   Code stroke was activated after discussing patient with attending physician, Dr. Nicanor Alcon.  Initiation of care has begun. The patient has been counseled on the process, plan, and necessity for staying for the completion/evaluation, and the remainder of the medical screening examination   Tiffany Cardenas 06/20/17 0008    Palumbo, April, MD 06/20/17 1610

## 2017-06-20 NOTE — ED Notes (Signed)
Pt. Return from MRI via stretcher. 

## 2017-06-20 NOTE — Discharge Instructions (Signed)
Follow up with primary care for further evaluation of ongoing right sided numbness.

## 2017-06-20 NOTE — ED Provider Notes (Signed)
[facility] EMERGENCY DEPARTMENT Provider Note   CSN: [id number] Arrival date & time: 06/19/17  2335     History   Chief Complaint Chief Complaint  Patient presents with  . Transient Ischemic Attack    HPI Tiffany Cardenas is a 67 y.o. female.  Patient with a history of DM, HTN, anxiety, chronic right paresthesias, presents with concern for changing symptoms of right sided numbness. He chronic symptoms include numbness of right abdomen, arm and anterior lower extremity. Chart review shows MRI/MRA brain in January of this year and patient confirms having symptoms that far back. Today she reports onset of headache and tingling around her mouth, as well as sharp mid back pain. She did not notice a facial droop. She did not have difficulty speaking. She reports photophobia with the headache. No nausea or vomiting. No fever, CP, SOB. No difficulty walking.   The history is provided by the patient. No language interpreter was used.    Past Medical History:  Diagnosis Date  . Diabetes mellitus without complication (HCC)   . DVT (deep venous thrombosis) (HCC) 2017  . GAD (generalized anxiety disorder)   . Gout   . Hypertension   . Hypothyroidism   . Panic attacks   . Sciatica   . Thyroid disease     Patient Active Problem List   Diagnosis Date Noted  . Withdrawal symptoms, drug or narcotic (HCC) 08/31/2016  . 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 MDD (major depressive disorder) 08/27/2016    Past Surgical History:  Procedure Laterality Date  . ABDOMINAL HYSTERECTOMY    . BACK SURGERY  2016  . CHOLECYSTECTOMY    . TONSILLECTOMY       OB History   None      Home Medications    Prior to Admission medications   Medication Sig Start Date End Date Taking? Authorizing Provider  aspirin 81 MG chewable tablet Chew 81 mg by mouth daily.    [provider]  Calcium Carb-Cholecalciferol (CALCIUM 600  + D PO) Take 1 tablet by mouth daily.     [provider]  diclofenac (VOLTAREN) 75 MG EC tablet Take 1 tablet (75 mg total) by mouth 2 (two) times daily. 06/19/17   Wieters, Hallie C, PA-C  gabapentin (NEURONTIN) 300 MG capsule Take 1 capsule (300 mg total) by mouth at bedtime. 03/08/17   Garey Ham, MD  L-Lysine HCl 500 MG CAPS Take 500 mg by mouth daily.    [provider]  lisinopril (PRINIVIL,ZESTRIL) 20 MG tablet Take 1 tablet (20 mg total) by mouth daily. 06/19/17   Wieters, Hallie C, PA-C  LORazepam (ATIVAN) 1 MG tablet Take 1 tablet (1 mg total) by mouth every 8 (eight) hours as needed for up to 14 days for anxiety. 06/19/17 07/03/17  Wieters, Hallie C, PA-C  metFORMIN (GLUCOPHAGE) 500 MG tablet Take 1 tablet (500 mg total) by mouth 2 (two) times daily with a meal. Patient taking differently: Take 500 mg by mouth daily with breakfast.  09/04/16   Denzil Magnuson, NP  methocarbamol (ROBAXIN) 500 MG tablet Take 1 tablet (500 mg total) by mouth 3 (three) times daily between meals as needed. 06/10/17   Rolland Porter, MD  methylPREDNISolone (MEDROL DOSEPAK) 4 MG TBPK tablet 6 po on day 1, decrease by 1 tab per day 06/10/17   Rolland Porter, MD  pantoprazole (PROTONIX) 20 MG tablet Take 1 tablet (20 mg total) by mouth daily.  Patient not taking: Reported on 06/10/2017 04/17/17 06/16/17  Joy, Hillard Danker, PA-C  predniSONE (STERAPRED UNI-PAK 21 TAB) 10 MG (21) TBPK tablet Take by mouth daily. Take 6 tabs by mouth daily  for 2 days, then 5 tabs for 2 days, then 4 tabs for 2 days, then 3 tabs for 2 days, 2 tabs for 2 days, then 1 tab by mouth daily for 2 days 06/19/17   Wieters, Hallie C, PA-C  thyroid (ARMOUR) 90 MG tablet Take 1 tablet (90 mg total) by mouth every morning. Patient taking differently: Take 90 mg by mouth daily.  09/04/16   Denzil Magnuson, NP    Family History Family History  Problem Relation Age of Onset  . Diabetes Father   . Heart attack Father   . Heart attack Paternal Uncle    . Sudden Cardiac Death Paternal Uncle     Social History Social History   Tobacco Use  . Smoking status: Never Smoker  . Smokeless tobacco: Never Used  Substance Use Topics  . Alcohol use: No  . Drug use: No     Allergies   Hydroxyzine; Penicillins; Sulfa antibiotics; Cymbalta [duloxetine hcl]; Hydrocodone; Ivp dye [iodinated diagnostic agents]; Chymopapain; Mango flavor; and Papaya derivatives   Review of Systems Review of Systems  Constitutional: Negative for chills and fever.  HENT: Negative.   Respiratory: Negative.  Negative for cough and shortness of breath.   Cardiovascular: Negative.  Negative for chest pain.  Gastrointestinal: Negative.  Negative for abdominal pain, nausea and vomiting.  Musculoskeletal: Negative.   Skin: Negative.   Neurological: Positive for weakness, numbness and headaches. Negative for facial asymmetry and speech difficulty.     Physical Exam Updated Vital Signs BP (!) 140/93   Pulse (!) 103   Temp 98.3 F (36.8 C) (Oral)   Resp 20   SpO2 100%   Physical Exam  Constitutional: She is oriented to person, place, and time. She appears well-developed and well-nourished.  HENT:  Head: Normocephalic.  Neck: Normal range of motion. Neck supple.  Cardiovascular: Normal rate and regular rhythm.  Pulmonary/Chest: Effort normal and breath sounds normal. She has no wheezes. She has no rales.  Abdominal: Soft. Bowel sounds are normal. There is no tenderness. There is no rebound and no guarding.  Musculoskeletal: Normal range of motion.  Neurological: She is alert and oriented to person, place, and time. She has normal strength and normal reflexes. She exhibits normal muscle tone. She displays a negative Romberg sign. Coordination normal. GCS eye subscore is 4. GCS verbal subscore is 5. GCS motor subscore is 6.  Skin: Skin is warm and dry. No rash noted.  Psychiatric: She has a normal mood and affect.     ED Treatments / Results  Labs (all  labs ordered are listed, but only abnormal results are displayed) Labs Reviewed  I-STAT CHEM 8, ED - Abnormal; Notable for the following components:      Result Value   BUN 31 (*)    Creatinine, Ser 1.10 (*)    Glucose, Bld 101 (*)    Calcium, Ion 1.14 (*)    Hemoglobin 11.9 (*)    HCT 35.0 (*)    All other components within normal limits  PROTIME-INR  APTT  CBC  DIFFERENTIAL  ETHANOL  COMPREHENSIVE METABOLIC PANEL  RAPID URINE DRUG SCREEN, HOSP PERFORMED  URINALYSIS, ROUTINE W REFLEX MICROSCOPIC  I-STAT TROPONIN, ED    EKG None  Radiology Ct Head Code Stroke Wo Contrast  Result Date:  06/20/2017 CLINICAL DATA:  Code stroke. 67 y/o F; 1 hour of right-sided weakness. EXAM: CT HEAD WITHOUT CONTRAST TECHNIQUE: Contiguous axial images were obtained from the base of the skull through the vertex without intravenous contrast. COMPARISON:  02/06/2017 MRI of the head and CT of the head. FINDINGS: Brain: No evidence of acute infarction, hemorrhage, hydrocephalus, extra-axial collection or mass lesion/mass effect. Stable mild chronic microvascular ischemic changes and parenchymal volume loss of the brain. Vascular: Mild calcific atherosclerosis of carotid siphons. No hyperdense vessel identified. Skull: Normal. Negative for fracture or focal lesion. Sinuses/Orbits: No acute finding. Other: None. ASPECTS [facility] Stroke Program Early CT Score) - Ganglionic level infarction (caudate, lentiform nuclei, internal capsule, insula, M1-M3 cortex): 7 - Supraganglionic infarction (M4-M6 cortex): 3 Total score (0-10 with 10 being normal): 10 IMPRESSION: 1. No acute intracranial abnormality identified. 2. ASPECTS is 10 3. Stable mild chronic microvascular ischemic changes and parenchymal volume loss of the brain. These results were communicated to Dr. Amada Jupiter at 12:27 amon 5/16/2019by text page via the [facility] messaging system. Electronically Signed   By: Mitzi Hansen M.D.   On: 06/20/2017 00:29     Procedures Procedures (including critical care time)  Medications Ordered in ED Medications - No data to display   Initial Impression / Assessment and Plan / ED Course  I have reviewed the triage vital signs and the nursing notes.  Pertinent labs & imaging results that were available during my care of the patient were reviewed by me and considered in my medical decision making (see chart for details).     She is seen at arrival by neurology, Dr. Amada Jupiter, who doubts acute stroke, favor complicated migraine given headache and perioral numbness as acute symptoms tonight. Chart reviewed. Patient had negative MRI/MRA brain in January, and subsequent MRI cervical and lumbar spine in March, 2019. MRI/MRA brain and MRI thoracic spine ordered per neurology today.   The patient is hemodynamically stable to await test results without immediate intervention.  MR studies are negative. She is felt to be stable for idscharge home. Offered treatment of headache which she declined. VSS. Discharge home with neurology follow up.  Final Clinical Impressions(s) / ED Diagnoses   Final diagnoses:  None   1. Complicated migraine 2. Paresthesias.   ED Discharge Orders    None       Elpidio Anis, PA-C 06/21/17 1610    Shon Baton, MD 06/23/17 2258

## 2017-07-16 ENCOUNTER — Emergency Department (HOSPITAL_COMMUNITY)
Admission: EM | Admit: 2017-07-16 | Discharge: 2017-07-16 | Disposition: A | Discharge status: Home / Self Care | Payer: Medicaid - Out of State | Attending: Emergency Medicine | Admitting: Emergency Medicine

## 2017-07-16 ENCOUNTER — Emergency Department (HOSPITAL_COMMUNITY): Payer: Medicaid - Out of State

## 2017-07-16 ENCOUNTER — Emergency Department (HOSPITAL_BASED_OUTPATIENT_CLINIC_OR_DEPARTMENT_OTHER)
Admit: 2017-07-16 | Discharge: 2017-07-16 | Disposition: A | Discharge status: Home / Self Care | Payer: Medicaid - Out of State | Attending: Emergency Medicine | Admitting: Emergency Medicine

## 2017-07-16 ENCOUNTER — Encounter (HOSPITAL_COMMUNITY): Payer: Self-pay | Admitting: Emergency Medicine

## 2017-07-16 DIAGNOSIS — M7989 Other specified soft tissue disorders: Secondary | ICD-10-CM

## 2017-07-16 DIAGNOSIS — E039 Hypothyroidism, unspecified: Secondary | ICD-10-CM | POA: Insufficient documentation

## 2017-07-16 DIAGNOSIS — R200 Anesthesia of skin: Secondary | ICD-10-CM | POA: Insufficient documentation

## 2017-07-16 DIAGNOSIS — M79601 Pain in right arm: Secondary | ICD-10-CM | POA: Insufficient documentation

## 2017-07-16 DIAGNOSIS — Z7984 Long term (current) use of oral hypoglycemic drugs: Secondary | ICD-10-CM | POA: Insufficient documentation

## 2017-07-16 DIAGNOSIS — E119 Type 2 diabetes mellitus without complications: Secondary | ICD-10-CM | POA: Diagnosis not present

## 2017-07-16 DIAGNOSIS — Z79899 Other long term (current) drug therapy: Secondary | ICD-10-CM | POA: Diagnosis not present

## 2017-07-16 DIAGNOSIS — M79609 Pain in unspecified limb: Secondary | ICD-10-CM

## 2017-07-16 DIAGNOSIS — I10 Essential (primary) hypertension: Secondary | ICD-10-CM | POA: Diagnosis present

## 2017-07-16 DIAGNOSIS — Z7982 Long term (current) use of aspirin: Secondary | ICD-10-CM | POA: Diagnosis not present

## 2017-07-16 LAB — BASIC METABOLIC PANEL
ANION GAP: 10 (ref 5–15)
BUN: 23 mg/dL — ABNORMAL HIGH (ref 6–20)
CHLORIDE: 103 mmol/L (ref 101–111)
CO2: 25 mmol/L (ref 22–32)
Calcium: 9.4 mg/dL (ref 8.9–10.3)
Creatinine, Ser: 1.07 mg/dL — ABNORMAL HIGH (ref 0.44–1.00)
GFR calc Af Amer: >60 mL/min (ref >60)
GFR calc non Af Amer: 54 mL/min — ABNORMAL LOW (ref >60)
GLUCOSE: 157 mg/dL — AB (ref 65–99)
POTASSIUM: 3.9 mmol/L (ref 3.5–5.1)
Sodium: 138 mmol/L (ref 135–145)

## 2017-07-16 LAB — CBG MONITORING, ED
GLUCOSE-CAPILLARY: 166 mg/dL — AB (ref 65–99)
Glucose-Capillary: 158 mg/dL — ABNORMAL HIGH (ref 65–99)

## 2017-07-16 LAB — CBC
HEMATOCRIT: 36.5 % (ref 36.0–46.0)
HEMOGLOBIN: 12 g/dL (ref 12.0–15.0)
MCH: 31.9 pg (ref 26.0–34.0)
MCHC: 32.9 g/dL (ref 30.0–36.0)
MCV: 97.1 fL (ref 78.0–100.0)
Platelets: 199 10*3/uL (ref 150–400)
RBC: 3.76 MIL/uL — AB (ref 3.87–5.11)
RDW: 12.2 % (ref 11.5–15.5)
WBC: 5.6 10*3/uL (ref 4.0–10.5)

## 2017-07-16 LAB — I-STAT TROPONIN, ED: TROPONIN I, POC: 0.01 ng/mL (ref 0.00–0.08)

## 2017-07-16 LAB — LIPASE, BLOOD: Lipase: 49 U/L (ref 11–51)

## 2017-07-16 MED ORDER — LORAZEPAM 1 MG PO TABS
1.0000 mg | ORAL_TABLET | Freq: Once | ORAL | Status: AC
  Administered 2017-07-16: 1 mg via ORAL
  Filled 2017-07-16: qty 1

## 2017-07-16 NOTE — Progress Notes (Signed)
Right lower extremity venous duplex completed. There is no evidence of a DVT or superficial thrombosis. There is an irregular shaped area with mixed echoes and fluid in the popliteal fossa. Probable Baker's cyst which has ruptured. [employer]Virginia Jay Kempe, RVS 07/16/2017 3:33

## 2017-07-16 NOTE — ED Triage Notes (Signed)
BIB EMS from home, pt reports HTN, anxiety. Pt seen here frequently same.

## 2017-07-16 NOTE — ED Notes (Signed)
Patient Alert and oriented to baseline. Stable and ambulatory to baseline. Patient verbalized understanding of the discharge instructions.  Patient belongings were taken by the patient. Patient stated she had a ride home but they cannot come get her until 10 pm, offered bus voucher, patient stated she would wait in the lobby for them and declined.

## 2017-07-16 NOTE — Discharge Instructions (Addendum)
You were evaluated in the emergency department for elevated blood pressure and right-sided numbness.  You had blood work and a CAT scan today along with a ultrasound of your right lower leg.  You have also had multiple prior work-ups for this including neurology consult and MRIs.  We did not have an obvious cause of your symptoms but it is important for you to follow-up with your primary care doctor and outpatient neurology.

## 2017-07-16 NOTE — ED Provider Notes (Signed)
[facility]   CSN: [id number] Arrival date & time: 07/16/17  0135     History   Chief Complaint Chief Complaint  Patient presents with  . Hypertension    HPI Tiffany Cardenas is a 67 y.o. female.  67 year old female with history of DVT hypertension anxiety diabetes here with worsening of her intermittent right-sided paresthesias.  She said since about midnight her blood pressure was elevated in the 200s with deepening of right arm right trunk right leg numbness.  Does not appear to be any weakness.  She denies any blurry vision double vision chest pain shortness of breath abdominal pain.  She said she is never had anything like this before but when I review her prior notes it sounds like she has had these symptoms and has had MRIs.    The history is provided by the patient.  Cerebrovascular Accident  This is a recurrent problem. The current episode started 6 to 12 hours ago. The problem occurs constantly. The problem has not changed since onset.Pertinent negatives include no chest pain, no abdominal pain, no headaches and no shortness of breath. Nothing aggravates the symptoms. Nothing relieves the symptoms. She has tried nothing for the symptoms. The treatment provided no relief.    Past Medical History:  Diagnosis Date  . Diabetes mellitus without complication (HCC)   . DVT (deep venous thrombosis) (HCC) 2017  . GAD (generalized anxiety disorder)   . Gout   . Hypertension   . Hypothyroidism   . Panic attacks   . Sciatica   . Thyroid disease     Patient Active Problem List   Diagnosis Date Noted  . Withdrawal symptoms, drug or narcotic (HCC) 08/31/2016  . 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 MDD (major depressive disorder) 08/27/2016    Past Surgical History:  Procedure Laterality Date  . ABDOMINAL HYSTERECTOMY    . BACK SURGERY  2016  . CHOLECYSTECTOMY    .  TONSILLECTOMY       OB History   None      Home Medications    Prior to Admission medications   Medication Sig Start Date End Date Taking? Authorizing Provider  aspirin 81 MG chewable tablet Chew 81 mg by mouth daily.    [provider]  Calcium Carb-Cholecalciferol (CALCIUM 600 + D PO) Take 1 tablet by mouth daily.     [provider]  diclofenac (VOLTAREN) 75 MG EC tablet Take 1 tablet (75 mg total) by mouth 2 (two) times daily. Patient not taking: Reported on 06/20/2017 06/19/17   Wieters, Hallie C, PA-C  gabapentin (NEURONTIN) 300 MG capsule Take 1 capsule (300 mg total) by mouth 2 (two) times daily. 06/20/17   Elpidio AnisUpstill, Shari, PA-C  L-Lysine HCl 500 MG CAPS Take 500 mg by mouth daily.    [provider]  lisinopril (PRINIVIL,ZESTRIL) 20 MG tablet Take 1 tablet (20 mg total) by mouth daily. 06/19/17   Wieters, Hallie C, PA-C  metFORMIN (GLUCOPHAGE) 500 MG tablet Take 1 tablet (500 mg total) by mouth 2 (two) times daily with a meal. Patient taking differently: Take 500 mg by mouth daily with breakfast.  09/04/16   Denzil Magnusonhomas, Lash[place], NP  methocarbamol (ROBAXIN) 500 MG tablet Take 1 tablet (500 mg total) by mouth 3 (three) times daily between meals as needed. Patient not taking: Reported on 06/20/2017 06/10/17   Rolland PorterJames, Mark, MD  methylPREDNISolone (MEDROL DOSEPAK) 4 MG TBPK tablet 6  po on day 1, decrease by 1 tab per day Patient not taking: Reported on 06/20/2017 06/10/17   Rolland Porter, MD  pantoprazole (PROTONIX) 20 MG tablet Take 1 tablet (20 mg total) by mouth daily. Patient not taking: Reported on 06/20/2017 04/17/17 06/20/25  Joy, Hillard Danker, PA-C  predniSONE (STERAPRED UNI-PAK 21 TAB) 10 MG (21) TBPK tablet Take by mouth daily. Take 6 tabs by mouth daily  for 2 days, then 5 tabs for 2 days, then 4 tabs for 2 days, then 3 tabs for 2 days, 2 tabs for 2 days, then 1 tab by mouth daily for 2 days 06/19/17   Wieters, Hallie C, PA-C  thyroid (ARMOUR) 90 MG tablet Take 1 tablet  (90 mg total) by mouth every morning. Patient taking differently: Take 90 mg by mouth daily.  09/04/16   Denzil Magnuson, NP    Family History Family History  Problem Relation Age of Onset  . Diabetes Father   . Heart attack Father   . Heart attack Paternal Uncle   . Sudden Cardiac Death Paternal Uncle     Social History Social History   Tobacco Use  . Smoking status: Never Smoker  . Smokeless tobacco: Never Used  Substance Use Topics  . Alcohol use: No  . Drug use: No     Allergies   Hydroxyzine; Penicillins; Sulfa antibiotics; Cymbalta [duloxetine hcl]; Hydrocodone; Ivp dye [iodinated diagnostic agents]; Chymopapain; Mango flavor; and Papaya derivatives   Review of Systems Review of Systems  Constitutional: Negative for fever.  HENT: Negative for sore throat.   Eyes: Negative for pain and visual disturbance.  Respiratory: Negative for shortness of breath.   Cardiovascular: Negative for chest pain.  Gastrointestinal: Negative for abdominal pain.  Genitourinary: Negative for dysuria.  Musculoskeletal: Negative for neck pain.  Skin: Negative for rash.  Neurological: Positive for numbness. Negative for seizures, facial asymmetry and headaches.     Physical Exam Updated Vital Signs BP 113/65   Pulse 70   Temp 97.7 F (36.5 C) (Oral)   Resp 18   SpO2 96%   Physical Exam  Constitutional: She is oriented to person, place, and time. She appears well-developed and well-nourished. No distress.  HENT:  Head: Normocephalic and atraumatic.  Eyes: Conjunctivae are normal.  Neck: Neck supple.  Cardiovascular: Normal rate and regular rhythm.  No murmur heard. Pulmonary/Chest: Effort normal and breath sounds normal. No respiratory distress.  Abdominal: Soft. There is no tenderness.  Musculoskeletal: She exhibits no edema.  Neurological: She is alert and oriented to person, place, and time. She has normal strength. A sensory deficit is present. No cranial nerve deficit.  GCS eye subscore is 4. GCS verbal subscore is 5. GCS motor subscore is 6.  Skin: Skin is warm and dry.  Psychiatric: She has a normal mood and affect.  Nursing note and vitals reviewed.    ED Treatments / Results  Labs (all labs ordered are listed, but only abnormal results are displayed) Labs Reviewed  CBC - Abnormal; Notable for the following components:      Result Value   RBC 3.76 (*)    All other components within normal limits  BASIC METABOLIC PANEL - Abnormal; Notable for the following components:   Glucose, Bld 157 (*)    BUN 23 (*)    Creatinine, Ser 1.07 (*)    GFR calc non Af Amer 54 (*)    All other components within normal limits  CBG MONITORING, ED - Abnormal; Notable for the  following components:   Glucose-Capillary 166 (*)    All other components within normal limits  LIPASE, BLOOD  I-STAT TROPONIN, ED    EKG EKG Interpretation  Date/Time:  Tuesday July 16 2017 06:59:57 EDT Ventricular Rate:  78 PR Interval:  156 QRS Duration: 88 QT Interval:  372 QTC Calculation: 424 R Axis:   -43 Text Interpretation:  Sinus rhythm with occasional Premature ventricular complexes Left axis deviation Abnormal ECG similar to prior 5/19 except now with pvc  Confirmed by Meridee Score [phone]) on 07/16/2017 11:20:21 AM   Radiology Ct Head Wo Contrast  Result Date: 07/16/2017 CLINICAL DATA:  Focal neuro deficit.  Right hand tingling. EXAM: CT HEAD WITHOUT CONTRAST TECHNIQUE: Contiguous axial images were obtained from the base of the skull through the vertex without intravenous contrast. COMPARISON:  MR brain 06/20/2017 FINDINGS: Brain: No evidence of acute infarction, hemorrhage, extra-axial collection, ventriculomegaly, or mass effect. Periventricular white matter low attenuation likely secondary to microangiopathy. Vascular: No hyperdense vessel. Skull: Negative for fracture or focal lesion. Sinuses/Orbits: Visualized portions of the orbits are unremarkable. Visualized  portions of the paranasal sinuses and mastoid air cells are unremarkable. Other: None. IMPRESSION: No acute intracranial pathology. Electronically Signed   By: Elige Ko   On: 07/16/2017 13:09    Procedures Procedures (including critical care time)  Medications Ordered in ED Medications - No data to display   Initial Impression / Assessment and Plan / ED Course  I have reviewed the triage vital signs and the nursing notes.  Pertinent labs & imaging results that were available during my care of the patient were reviewed by me and considered in my medical decision making (see chart for details).  Clinical Course as of Jul 17 1528  Tue Jul 16, 2017  1559 She with prior extensive work-up for her right sided paresthesias.  She has had MRIs and neurology consults as recent as 3 weeks ago.  I offered to her that we get a head CT to document that she did not have a head bleed in the setting of her hypertension and worsening of her neuro symptoms.   the CT scan was ultimately unremarkable.  She is very frustrated that we have not found an answer for her symptoms.  The ultrasound of the lower extremity did show a possible Baker's cyst and it could be a cause of some of her leg pain but he cannot fit it to the rest of her symptoms.  She will need to follow-up with primary care outpatient along with neurology outpatient.  She is asking for a dose of her Ativan that she takes routinely and did not get since she was here since midnight.   [MB]    Clinical Course User Index [MB] Terrilee Files, MD     Final Clinical Impressions(s) / ED Diagnoses   Final diagnoses:  Hypertension, unspecified type  Right sided numbness    ED Discharge Orders    None       Terrilee Files, MD 07/17/17 1531

## 2017-07-16 NOTE — ED Notes (Signed)
Pt reports R hand tingling, upper back pain (pulling) radiating around to RUQ with shob, pt states pain began yesterday and has continued since that time. EKG done.

## 2017-07-16 NOTE — ED Notes (Signed)
Patient wanted to know if it was possible to wrap her leg with ACE bandage, wrapped patient's leg per MD and explained to patient it may not make it better but it may ease the pain of her cyst.

## 2018-04-06 ENCOUNTER — Emergency Department (HOSPITAL_COMMUNITY)
Admission: EM | Admit: 2018-04-06 | Discharge: 2018-04-06 | Disposition: A | Discharge status: Home / Self Care | Payer: Medicare Other | Attending: Emergency Medicine | Admitting: Emergency Medicine

## 2018-04-06 ENCOUNTER — Emergency Department (HOSPITAL_COMMUNITY): Payer: Medicare Other

## 2018-04-06 ENCOUNTER — Other Ambulatory Visit: Payer: Self-pay

## 2018-04-06 ENCOUNTER — Emergency Department (HOSPITAL_BASED_OUTPATIENT_CLINIC_OR_DEPARTMENT_OTHER): Payer: Medicare Other

## 2018-04-06 ENCOUNTER — Encounter (HOSPITAL_COMMUNITY): Payer: Self-pay | Admitting: *Deleted

## 2018-04-06 DIAGNOSIS — E119 Type 2 diabetes mellitus without complications: Secondary | ICD-10-CM | POA: Insufficient documentation

## 2018-04-06 DIAGNOSIS — I10 Essential (primary) hypertension: Secondary | ICD-10-CM | POA: Insufficient documentation

## 2018-04-06 DIAGNOSIS — M898X1 Other specified disorders of bone, shoulder: Secondary | ICD-10-CM

## 2018-04-06 DIAGNOSIS — G8918 Other acute postprocedural pain: Secondary | ICD-10-CM | POA: Insufficient documentation

## 2018-04-06 DIAGNOSIS — R079 Chest pain, unspecified: Secondary | ICD-10-CM | POA: Insufficient documentation

## 2018-04-06 DIAGNOSIS — Z79899 Other long term (current) drug therapy: Secondary | ICD-10-CM | POA: Insufficient documentation

## 2018-04-06 DIAGNOSIS — E039 Hypothyroidism, unspecified: Secondary | ICD-10-CM | POA: Diagnosis not present

## 2018-04-06 DIAGNOSIS — Z7982 Long term (current) use of aspirin: Secondary | ICD-10-CM | POA: Insufficient documentation

## 2018-04-06 DIAGNOSIS — Z7984 Long term (current) use of oral hypoglycemic drugs: Secondary | ICD-10-CM | POA: Diagnosis not present

## 2018-04-06 DIAGNOSIS — M7989 Other specified soft tissue disorders: Secondary | ICD-10-CM

## 2018-04-06 DIAGNOSIS — R52 Pain, unspecified: Secondary | ICD-10-CM

## 2018-04-06 DIAGNOSIS — M79604 Pain in right leg: Secondary | ICD-10-CM

## 2018-04-06 DIAGNOSIS — M79605 Pain in left leg: Secondary | ICD-10-CM

## 2018-04-06 LAB — BASIC METABOLIC PANEL
Anion gap: 13 (ref 5–15)
BUN: 35 mg/dL — ABNORMAL HIGH (ref 8–23)
CO2: 19 mmol/L — ABNORMAL LOW (ref 22–32)
Calcium: 8.8 mg/dL — ABNORMAL LOW (ref 8.9–10.3)
Chloride: 102 mmol/L (ref 98–111)
Creatinine, Ser: 0.91 mg/dL (ref 0.44–1.00)
GFR calc Af Amer: >60 mL/min (ref >60)
GFR calc non Af Amer: >60 mL/min (ref >60)
Glucose, Bld: 115 mg/dL — ABNORMAL HIGH (ref 70–99)
POTASSIUM: 4.2 mmol/L (ref 3.5–5.1)
Sodium: 134 mmol/L — ABNORMAL LOW (ref 135–145)

## 2018-04-06 LAB — CBC
HCT: 34.3 % — ABNORMAL LOW (ref 36.0–46.0)
HEMOGLOBIN: 11.3 g/dL — AB (ref 12.0–15.0)
MCH: 32.3 pg (ref 26.0–34.0)
MCHC: 32.9 g/dL (ref 30.0–36.0)
MCV: 98 fL (ref 80.0–100.0)
NRBC: 0 % (ref 0.0–0.2)
Platelets: 212 10*3/uL (ref 150–400)
RBC: 3.5 MIL/uL — ABNORMAL LOW (ref 3.87–5.11)
RDW: 12.2 % (ref 11.5–15.5)
WBC: 8 10*3/uL (ref 4.0–10.5)

## 2018-04-06 LAB — I-STAT TROPONIN, ED
TROPONIN I, POC: 0 ng/mL (ref 0.00–0.08)
Troponin i, poc: 0 ng/mL (ref 0.00–0.08)

## 2018-04-06 LAB — TROPONIN I: Troponin I: <0.03 ng/mL (ref <0.03)

## 2018-04-06 MED ORDER — DIPHENHYDRAMINE HCL 50 MG/ML IJ SOLN: 25.0000 mg | Freq: Once | INTRAMUSCULAR | Status: DC

## 2018-04-06 MED ORDER — ACETAMINOPHEN 500 MG PO TABS
1000.0000 mg | ORAL_TABLET | Freq: Once | ORAL | Status: AC
  Administered 2018-04-06: 1000 mg via ORAL
  Filled 2018-04-06: qty 2

## 2018-04-06 MED ORDER — SODIUM CHLORIDE 0.9% FLUSH: 3.0000 mL | Freq: Once | INTRAVENOUS | Status: DC

## 2018-04-06 MED ORDER — IOHEXOL 350 MG/ML SOLN
100.0000 mL | Freq: Once | INTRAVENOUS | Status: AC | PRN
  Administered 2018-04-06: 100 mL via INTRAVENOUS

## 2018-04-06 MED ORDER — DIPHENHYDRAMINE HCL 50 MG/ML IJ SOLN
50.0000 mg | Freq: Once | INTRAMUSCULAR | Status: AC
  Administered 2018-04-06: 50 mg via INTRAVENOUS
  Filled 2018-04-06: qty 1

## 2018-04-06 MED ORDER — METHYLPREDNISOLONE SODIUM SUCC 125 MG IJ SOLR
125.0000 mg | Freq: Once | INTRAMUSCULAR | Status: AC
  Administered 2018-04-06: 125 mg via INTRAVENOUS
  Filled 2018-04-06: qty 2

## 2018-04-06 NOTE — ED Triage Notes (Signed)
The pt arrived by gems from home. ashe had rt knee surgery  Repair of a torn menicus last Thursday.  approx one hour ago  She had a sudden sharp pain in her posterior chest  She still has a heaviness in her chest  Hx of dvts

## 2018-04-06 NOTE — Progress Notes (Signed)
Bilateral lower extremity venous duplex completed. Preliminary results in Chart review CV Proc. [employer], RVS 04/06/2018, 10:57 AM

## 2018-04-06 NOTE — ED Notes (Signed)
Patient provided diet ginger ale per her request

## 2018-04-06 NOTE — ED Notes (Signed)
ED Provider at bedside. 

## 2018-04-06 NOTE — ED Notes (Signed)
Pt placed on purewick. Urine sent to the lab.

## 2018-04-06 NOTE — ED Provider Notes (Signed)
68 year old female with a prior history of DVT, recent right knee surgery 3 days ago, presents with concern for left leg pain, and pleuritic back pain and shortness of breath.  She has a history of contrast allergy, and has been weaning pretreatment and CT scan.   CT PE study completed shows no PE.  Delta troponin negative.  DVT study shows no DVT.  Discussed results. Recommend PCP and Orthopedic follow up. Patient discharged in stable condition with understanding of reasons to return.    Alvira Monday, MD 04/07/18 [phone]

## 2018-04-06 NOTE — ED Notes (Signed)
Patient transported to Ultrasound 

## 2018-04-06 NOTE — ED Notes (Signed)
Patient transported to CT 

## 2018-04-06 NOTE — ED Provider Notes (Signed)
[facility] EMERGENCY DEPARTMENT Provider Note   CSN: [id number] Arrival date & time: 04/06/18  0234    History   Chief Complaint Chief Complaint  Patient presents with  . Chest Pain    HPI Tiffany Cardenas is a 68 y.o. female.     The history is provided by the patient.  Back Pain  Pain location: right scapular pain. Quality:  Stabbing Radiates to:  Does not radiate Pain severity:  Severe Pain is:  Same all the time Onset quality:  Sudden Timing:  Constant Progression:  Unchanged Chronicity:  New Context comment:  Post knee surgery on Thursday at rest Relieved by:  Nothing Worsened by:  Nothing Ineffective treatments:  None tried Associated symptoms: no abdominal pain, no abdominal swelling, no bladder incontinence, no bowel incontinence, no chest pain, no dysuria, no fever, no headaches, no leg pain, no numbness, no paresthesias, no pelvic pain, no perianal numbness, no tingling, no weakness and no weight loss   Risk factors: recent surgery   Patient with a h/o meniscal repair done on Thursday by Dr. Eulah Pont who presents with sudden onset scapular pain at rest.  Patient called the on call orthopedic surgeon and was referred in for a further work up for PE.    Past Medical History:  Diagnosis Date  . Diabetes mellitus without complication (HCC)   . DVT (deep venous thrombosis) (HCC) 2017  . GAD (generalized anxiety disorder)   . Gout   . Hypertension   . Hypothyroidism   . Panic attacks   . Sciatica   . Thyroid disease     Patient Active Problem List   Diagnosis Date Noted  . Withdrawal symptoms, drug or narcotic (HCC) 08/31/2016  . 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 MDD (major depressive disorder) 08/27/2016    Past Surgical History:  Procedure Laterality Date  . ABDOMINAL HYSTERECTOMY    . BACK SURGERY  2016  . CHOLECYSTECTOMY    . TONSILLECTOMY       OB History   No obstetric  history on file.      Home Medications    Prior to Admission medications   Medication Sig Start Date End Date Taking? Authorizing Provider  aspirin 81 MG chewable tablet Chew 81 mg by mouth daily.    [provider]  Calcium Carb-Cholecalciferol (CALCIUM 600 + D PO) Take 1 tablet by mouth daily.     [provider]  diclofenac (VOLTAREN) 75 MG EC tablet Take 1 tablet (75 mg total) by mouth 2 (two) times daily. Patient not taking: Reported on 06/20/2017 06/19/17   Wieters, Hallie C, PA-C  gabapentin (NEURONTIN) 300 MG capsule Take 1 capsule (300 mg total) by mouth 2 (two) times daily. 06/20/17   Elpidio Anis, PA-C  L-Lysine HCl 500 MG CAPS Take 500 mg by mouth daily.    [provider]  lisinopril (PRINIVIL,ZESTRIL) 20 MG tablet Take 1 tablet (20 mg total) by mouth daily. 06/19/17   Wieters, Hallie C, PA-C  metFORMIN (GLUCOPHAGE) 500 MG tablet Take 1 tablet (500 mg total) by mouth 2 (two) times daily with a meal. Patient taking differently: Take 500 mg by mouth daily with breakfast.  09/04/16   Denzil Magnuson, NP  methocarbamol (ROBAXIN) 500 MG tablet Take 1 tablet (500 mg total) by mouth 3 (three) times daily between meals as needed. Patient not taking: Reported on 06/20/2017 06/10/17   Rolland Porter, MD  methylPREDNISolone (MEDROL DOSEPAK) 4 MG  TBPK tablet 6 po on day 1, decrease by 1 tab per day Patient not taking: Reported on 06/20/2017 06/10/17   Rolland Porter, MD  pantoprazole (PROTONIX) 20 MG tablet Take 1 tablet (20 mg total) by mouth daily. Patient not taking: Reported on 06/20/2017 04/17/17 06/20/25  Joy, Hillard Danker, PA-C  predniSONE (STERAPRED UNI-PAK 21 TAB) 10 MG (21) TBPK tablet Take by mouth daily. Take 6 tabs by mouth daily  for 2 days, then 5 tabs for 2 days, then 4 tabs for 2 days, then 3 tabs for 2 days, 2 tabs for 2 days, then 1 tab by mouth daily for 2 days 06/19/17   Wieters, Hallie C, PA-C  thyroid (ARMOUR) 90 MG tablet Take 1 tablet (90 mg total) by mouth  every morning. Patient taking differently: Take 90 mg by mouth daily.  09/04/16   Denzil Magnuson, NP    Family History Family History  Problem Relation Age of Onset  . Diabetes Father   . Heart attack Father   . Heart attack Paternal Uncle   . Sudden Cardiac Death Paternal Uncle     Social History Social History   Tobacco Use  . Smoking status: Never Smoker  . Smokeless tobacco: Never Used  Substance Use Topics  . Alcohol use: No  . Drug use: No     Allergies   Hydroxyzine; Penicillins; Sulfa antibiotics; Cymbalta [duloxetine hcl]; Hydrocodone; Ivp dye [iodinated diagnostic agents]; Chymopapain; Mango flavor; and Papaya derivatives   Review of Systems Review of Systems  Constitutional: Negative for diaphoresis, fever and weight loss.  Cardiovascular: Negative for chest pain and palpitations.  Gastrointestinal: Negative for abdominal pain and bowel incontinence.  Genitourinary: Negative for bladder incontinence, dysuria and pelvic pain.  Musculoskeletal: Positive for back pain.  Neurological: Negative for tingling, weakness, numbness, headaches and paresthesias.  All other systems reviewed and are negative.    Physical Exam Updated Vital Signs BP (!) 150/70   Pulse (!) 59   Temp 98.3 F (36.8 C) (Oral)   Resp 19   Ht 5\' 6"  (1.676 m)   Wt 99.8 kg   SpO2 96%   BMI 35.51 kg/m   Physical Exam Vitals signs and nursing note reviewed.  Constitutional:      General: She is not in acute distress. HENT:     Head: Normocephalic and atraumatic.     Nose: Nose normal.     Mouth/Throat:     Mouth: Mucous membranes are moist.     Pharynx: Oropharynx is clear.  Eyes:     Conjunctiva/sclera: Conjunctivae normal.     Pupils: Pupils are equal, round, and reactive to light.  Neck:     Musculoskeletal: Normal range of motion and neck supple.  Cardiovascular:     Rate and Rhythm: Normal rate and regular rhythm.     Pulses: Normal pulses.     Heart sounds: Normal  heart sounds.  Pulmonary:     Effort: Pulmonary effort is normal.     Breath sounds: Normal breath sounds.  Abdominal:     General: Abdomen is flat. Bowel sounds are normal.     Tenderness: There is no abdominal tenderness. There is no guarding or rebound.  Musculoskeletal:     Right lower leg: No edema.     Left lower leg: No edema.  Skin:    General: Skin is warm and dry.     Capillary Refill: Capillary refill takes less than 2 seconds.     Comments: Knee incisions CDI  Neurological:     General: No focal deficit present.     Mental Status: She is alert and oriented to person, place, and time.  Psychiatric:        Mood and Affect: Mood normal.        Behavior: Behavior normal.      ED Treatments / Results  Labs (all labs ordered are listed, but only abnormal results are displayed) Results for orders placed or performed during the hospital encounter of 04/06/18  Basic metabolic panel  Result Value Ref Range   Sodium 134 (L) 135 - 145 mmol/L   Potassium 4.2 3.5 - 5.1 mmol/L   Chloride 102 98 - 111 mmol/L   CO2 19 (L) 22 - 32 mmol/L   Glucose, Bld 115 (H) 70 - 99 mg/dL   BUN 35 (H) 8 - 23 mg/dL   Creatinine, Ser 6.96 0.44 - 1.00 mg/dL   Calcium 8.8 (L) 8.9 - 10.3 mg/dL   GFR calc non Af Amer >60 >60 mL/min   GFR calc Af Amer >60 >60 mL/min   Anion gap 13 5 - 15  CBC  Result Value Ref Range   WBC 8.0 4.0 - 10.5 K/uL   RBC 3.50 (L) 3.87 - 5.11 MIL/uL   Hemoglobin 11.3 (L) 12.0 - 15.0 g/dL   HCT 29.5 (L) 28.4 - 13.2 %   MCV 98.0 80.0 - 100.0 fL   MCH 32.3 26.0 - 34.0 pg   MCHC 32.9 30.0 - 36.0 g/dL   RDW 44.0 10.2 - 72.5 %   Platelets 212 150 - 400 K/uL   nRBC 0.0 0.0 - 0.2 %  Troponin I - ONCE - STAT  Result Value Ref Range   Troponin I <0.03 <0.03 ng/mL  I-stat troponin, ED  Result Value Ref Range   Troponin i, poc 0.00 0.00 - 0.08 ng/mL   Comment 3           Dg Chest 2 View  Result Date: 04/06/2018 CLINICAL DATA:  68 year old female with chest pain. EXAM:  CHEST - 2 VIEW COMPARISON:  Chest radiograph dated 04/17/2017 and CT dated 04/17/2017 FINDINGS: Minimal left lung base atelectatic changes. No focal consolidation, pleural effusion, or pneumothorax. Calcified left mid lung field granuloma. Stable cardiac silhouette. No acute osseous pathology. IMPRESSION: No active cardiopulmonary disease. Electronically Signed   By: Elgie Collard M.D.   On: 04/06/2018 04:16  ' EKG EKG Interpretation  Date/Time:  Sunday April 06 2018 02:41:21 EST Ventricular Rate:  63 PR Interval:    QRS Duration: 90 QT Interval:  400 QTC Calculation: 410 R Axis:   -12 Text Interpretation:  Sinus rhythm Confirmed by Daphna Lafuente ([id number]) on 04/06/2018 5:07:49 AM   Radiology Dg Chest 2 View  Result Date: 04/06/2018 CLINICAL DATA:  69 year old female with chest pain. EXAM: CHEST - 2 VIEW COMPARISON:  Chest radiograph dated 04/17/2017 and CT dated 04/17/2017 FINDINGS: Minimal left lung base atelectatic changes. No focal consolidation, pleural effusion, or pneumothorax. Calcified left mid lung field granuloma. Stable cardiac silhouette. No acute osseous pathology. IMPRESSION: No active cardiopulmonary disease. Electronically Signed   By: Elgie Collard M.D.   On: 04/06/2018 04:16    Procedures Procedures (including critical care time)  Medications Ordered in ED Medications  sodium chloride flush (NS) 0.9 % injection 3 mL (has no administration in time range)  diphenhydrAMINE (BENADRYL) injection 50 mg (has no administration in time range)  methylPREDNISolone sodium succinate (SOLU-MEDROL) 125 mg/2 mL injection 125 mg (125 mg Intravenous  Given 04/06/18 0451)     Initial Impression / Assessment and Plan / ED Course   Pre medicated for CT per radiology protocol given patient's allergy    Final Clinical Impressions(s) / ED Diagnoses   Signed out to Dr. Dalene Seltzer pending results of scans.      Bryttney Netzer, MD 04/06/18 [phone]

## 2018-04-06 NOTE — ED Notes (Signed)
Patient verbalizes understanding of discharge instructions. Opportunity for questioning and answers were provided. Armband removed by staff, pt discharged from ED home via POV.  

## 2018-08-01 ENCOUNTER — Other Ambulatory Visit (HOSPITAL_COMMUNITY): Payer: Self-pay | Admitting: Orthopedic Surgery

## 2018-08-01 ENCOUNTER — Ambulatory Visit (HOSPITAL_COMMUNITY)
Admission: RE | Admit: 2018-08-01 | Discharge: 2018-08-01 | Disposition: A | Discharge status: Home / Self Care | Payer: Medicare Other | Source: Ambulatory Visit | Attending: Orthopedic Surgery | Admitting: Orthopedic Surgery

## 2018-08-01 ENCOUNTER — Other Ambulatory Visit: Payer: Self-pay | Admitting: Orthopedic Surgery

## 2018-08-01 ENCOUNTER — Other Ambulatory Visit: Payer: Self-pay

## 2018-08-01 DIAGNOSIS — M7989 Other specified soft tissue disorders: Secondary | ICD-10-CM

## 2018-08-01 DIAGNOSIS — M79606 Pain in leg, unspecified: Secondary | ICD-10-CM | POA: Diagnosis present

## 2018-08-01 DIAGNOSIS — M545 Low back pain, unspecified: Secondary | ICD-10-CM

## 2018-08-01 NOTE — Progress Notes (Signed)
Bilateral lower extremity venous duplex completed. Refer to "CV Proc" under chart review to view preliminary results.  08/01/2018 11:03 AM Maudry Mayhew, MHA, RVT, RDCS, RDMS

## 2018-08-23 ENCOUNTER — Other Ambulatory Visit: Payer: Self-pay

## 2018-08-23 ENCOUNTER — Ambulatory Visit
Admission: RE | Admit: 2018-08-23 | Discharge: 2018-08-23 | Disposition: A | Discharge status: Home / Self Care | Payer: Medicare Other | Source: Ambulatory Visit | Attending: Orthopedic Surgery | Admitting: Orthopedic Surgery

## 2018-08-23 DIAGNOSIS — M545 Low back pain, unspecified: Secondary | ICD-10-CM

## 2019-07-24 IMAGING — MR MR LUMBAR SPINE W/O CM
4 of 5 series · 26 of 48 positions shown · non-contrast
Comparison: None.

CLINICAL DATA: She describes weakness of the leg. States she cannot
support her weight fully with the leg today. States it is "asleep"
but also states that I have normal feeling in it. Denies
paresthesias.

EXAM:
MRI LUMBAR SPINE WITHOUT CONTRAST
TECHNIQUE: Multiplanar, multisequence MR imaging of the lumbar spine was
performed. No intravenous contrast was administered.

[Series 5001: T2 · sagittal · 4.0mm · 0.73mm/px · 7 of 16 slices shown (1 of 2)]
[im 1/16]
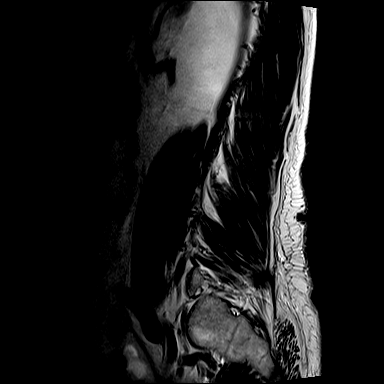
[im 3/16]
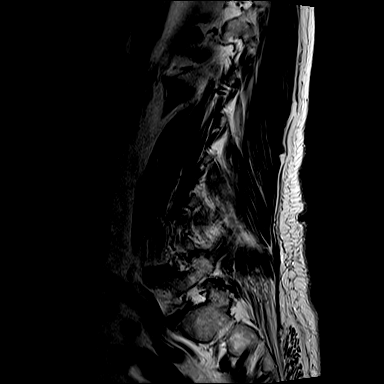
[im 6/16]
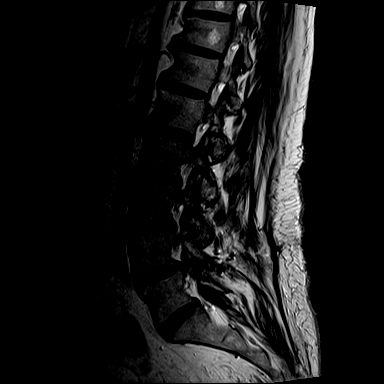
[im 8/16]
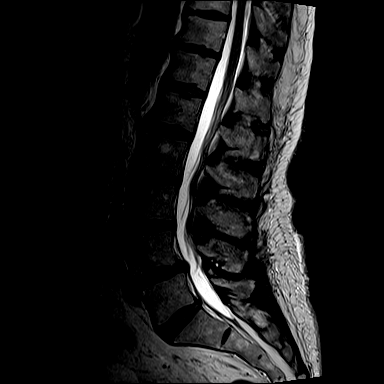
[im 11/16]
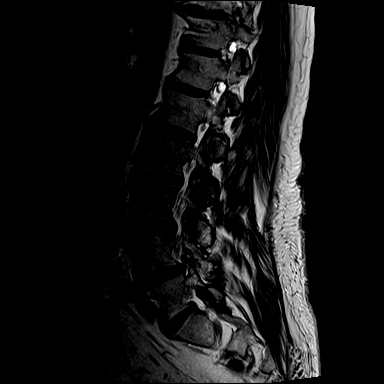
[im 13/16]
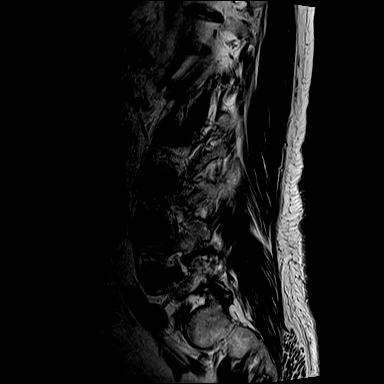
[im 16/16]
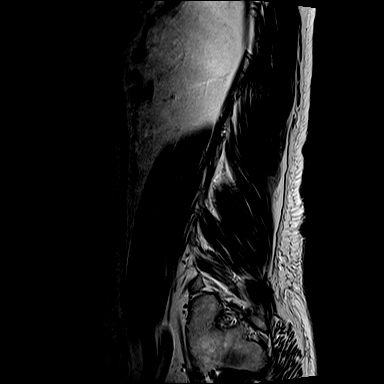

[Series 7001: T1 · sagittal · 4.0mm · 0.88mm/px · 6 of 16 slices shown (1 of 2)]
[im 1/16]
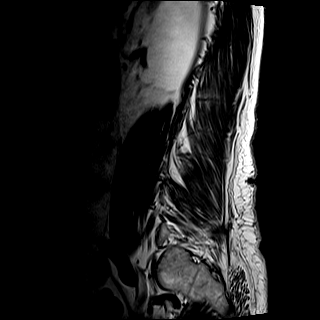
[im 4/16]
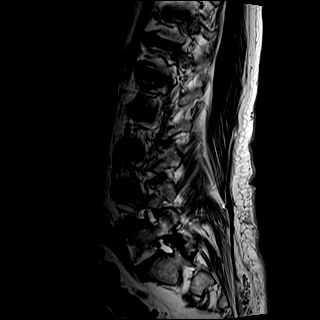
[im 7/16]
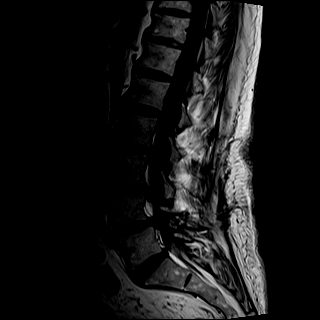
[im 10/16]
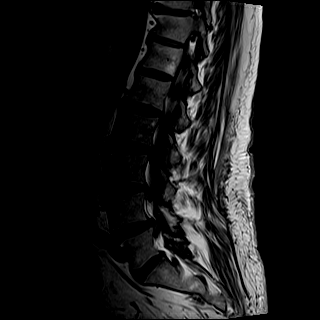
[im 13/16]
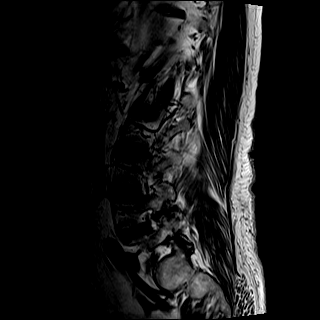
[im 16/16]
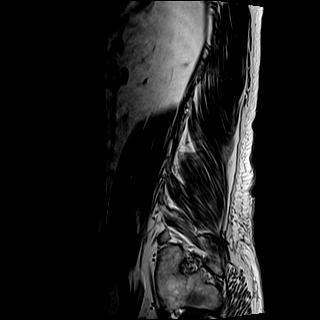

[Series 8001: T2 · axial · 4.0mm · 0.57mm/px · z∈[-158,+48]mm · 8 of 36 slices shown (2 of 2)]
[im 1/36]
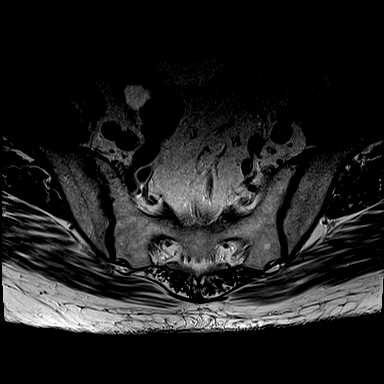
[im 6/36]
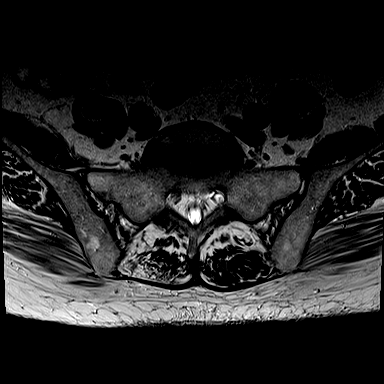
[im 11/36]
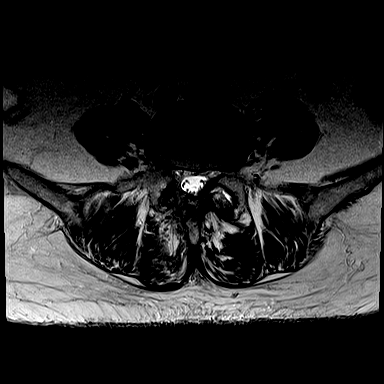
[im 17/36]
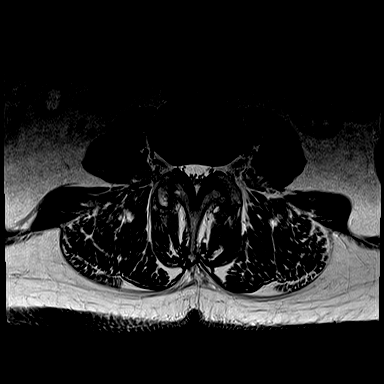
[im 19/36]
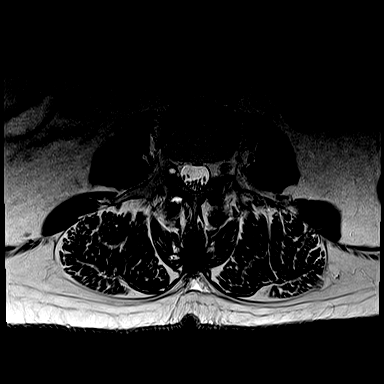
[im 25/36]
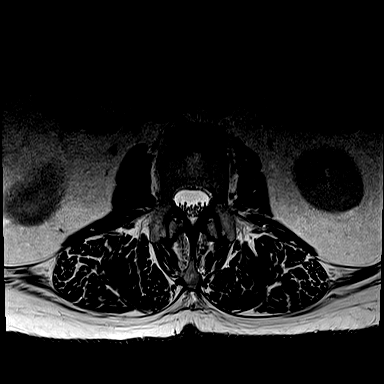
[im 30/36]
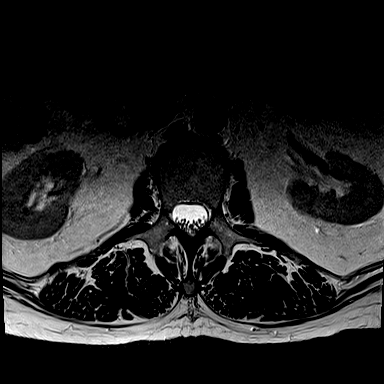
[im 36/36]
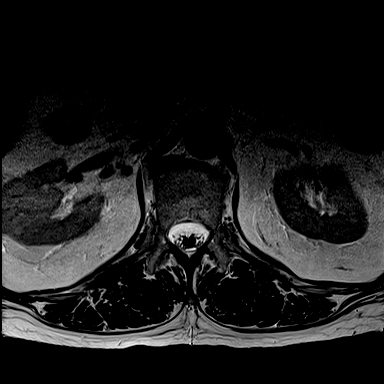

[Series 9001: T1 · axial · 4.0mm · 0.34mm/px · z∈[-158,+18]mm · 5 of 36 slices shown (2 of 2)]
[im 1/36]
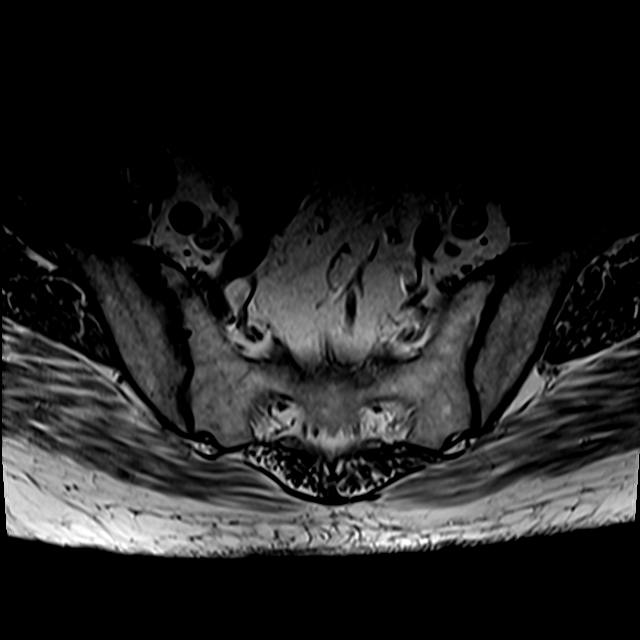
[im 6/36]
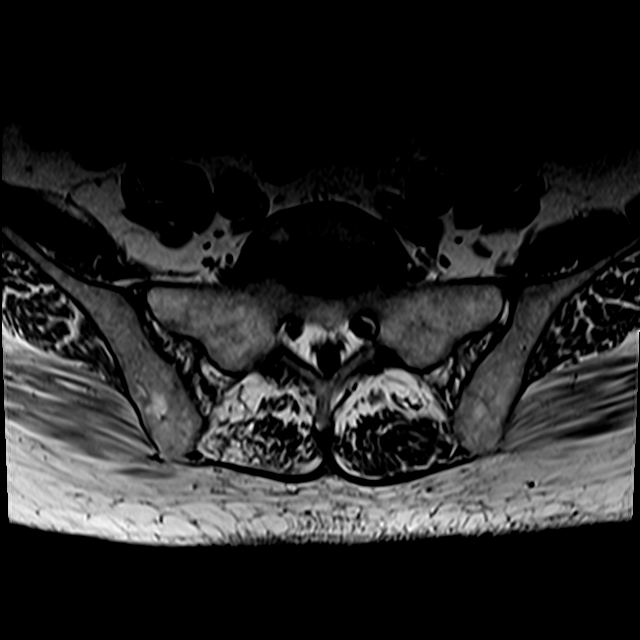
[im 11/36]
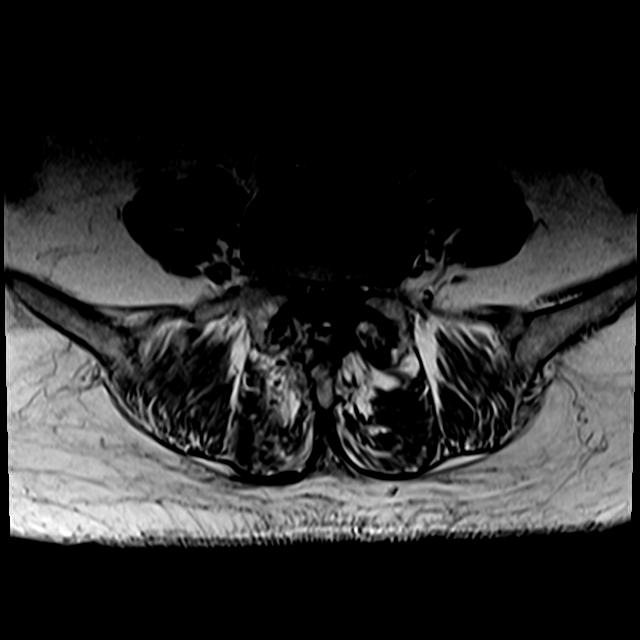
[im 19/36]
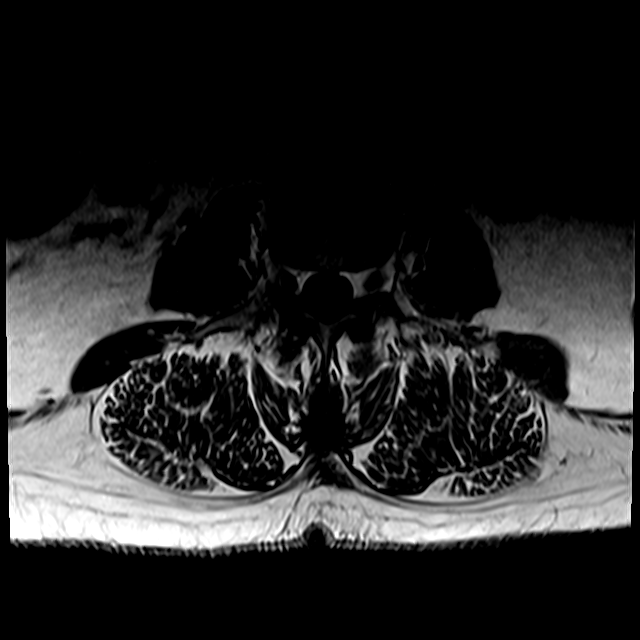
[im 30/36]
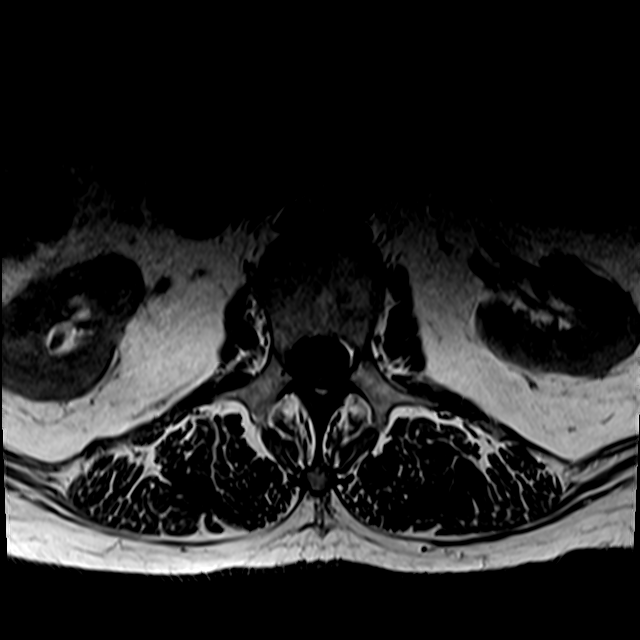

[26 of 48 positions shown; findings below may reference images not displayed]

FINDINGS: Segmentation:  Standard.

Alignment:  Minimal anterolisthesis of L4 on L5.

Vertebrae:  No fracture, evidence of discitis, or bone lesion.

Conus medullaris and cauda equina: Conus extends to the T12 level.
Conus and cauda equina appear normal.

Paraspinal and other soft tissues: No acute paraspinal abnormality.

Disc levels:

Disc spaces: Disc desiccation throughout the lumbar spine. Mild disc
height loss L4-5.

T12-L1: No significant disc bulge. No evidence of neural foraminal
stenosis. No central canal stenosis.

L1-L2: No significant disc bulge. No evidence of neural foraminal
stenosis. No central canal stenosis.

L2-L3: No significant disc bulge. No evidence of neural foraminal
stenosis. No central canal stenosis.

L3-L4: Minimal broad-based disc bulge. Mild bilateral facet
arthropathy. 4 mm right facet synovial cyst projecting into the
right neural foramen versus a small perineural cyst unchanged from
07/12/2014. No evidence of neural foraminal stenosis. No central
canal stenosis.

L4-L5: Mild broad-based disc bulge. Small central disc protrusion.
Moderate right and severe left facet arthropathy. No evidence of
neural foraminal stenosis. No central canal stenosis.

L5-S1: Mild broad-based disc bulge. Severe bilateral facet
arthropathy. No evidence of neural foraminal stenosis. No central
canal stenosis.
IMPRESSION: 1. At L3-4 there is a minimal broad-based disc bulge. Mild bilateral
facet arthropathy. 4 mm right facet synovial cyst projecting into
the right neural foramen versus a small perineural cyst unchanged
from 07/12/2014.
2. At L4-5 there is a mild broad-based disc bulge. Small central
disc protrusion. Moderate right and severe left facet arthropathy.

## 2020-02-10 DIAGNOSIS — N1831 Chronic kidney disease, stage 3a: Secondary | ICD-10-CM | POA: Insufficient documentation

## 2022-05-24 ENCOUNTER — Emergency Department (HOSPITAL_COMMUNITY): Payer: Medicare Other

## 2022-05-24 ENCOUNTER — Emergency Department (HOSPITAL_COMMUNITY)
Admission: EM | Admit: 2022-05-24 | Discharge: 2022-05-25 | Disposition: A | Discharge status: Home / Self Care | Payer: Medicare Other | Attending: Emergency Medicine | Admitting: Emergency Medicine

## 2022-05-24 ENCOUNTER — Encounter (HOSPITAL_COMMUNITY): Payer: Self-pay

## 2022-05-24 DIAGNOSIS — R109 Unspecified abdominal pain: Secondary | ICD-10-CM | POA: Diagnosis present

## 2022-05-24 DIAGNOSIS — Z7984 Long term (current) use of oral hypoglycemic drugs: Secondary | ICD-10-CM | POA: Insufficient documentation

## 2022-05-24 DIAGNOSIS — I10 Essential (primary) hypertension: Secondary | ICD-10-CM | POA: Diagnosis not present

## 2022-05-24 DIAGNOSIS — E119 Type 2 diabetes mellitus without complications: Secondary | ICD-10-CM | POA: Diagnosis not present

## 2022-05-24 DIAGNOSIS — Z79899 Other long term (current) drug therapy: Secondary | ICD-10-CM | POA: Diagnosis not present

## 2022-05-24 DIAGNOSIS — R1084 Generalized abdominal pain: Secondary | ICD-10-CM | POA: Diagnosis not present

## 2022-05-24 DIAGNOSIS — Z7901 Long term (current) use of anticoagulants: Secondary | ICD-10-CM | POA: Diagnosis not present

## 2022-05-24 DIAGNOSIS — Z7982 Long term (current) use of aspirin: Secondary | ICD-10-CM | POA: Insufficient documentation

## 2022-05-24 LAB — CBC
HCT: 34.6 % — ABNORMAL LOW (ref 36.0–46.0)
Hemoglobin: 11.5 g/dL — ABNORMAL LOW (ref 12.0–15.0)
MCH: 32.2 pg (ref 26.0–34.0)
MCHC: 33.2 g/dL (ref 30.0–36.0)
MCV: 96.9 fL (ref 80.0–100.0)
Platelets: 268 10*3/uL (ref 150–400)
RBC: 3.57 MIL/uL — ABNORMAL LOW (ref 3.87–5.11)
RDW: 12.9 % (ref 11.5–15.5)
WBC: 6.2 10*3/uL (ref 4.0–10.5)
nRBC: 0 % (ref 0.0–0.2)

## 2022-05-24 LAB — COMPREHENSIVE METABOLIC PANEL
ALT: 25 U/L (ref 0–44)
AST: 27 U/L (ref 15–41)
Albumin: 3.6 g/dL (ref 3.5–5.0)
Alkaline Phosphatase: 49 U/L (ref 38–126)
Anion gap: 12 (ref 5–15)
BUN: 18 mg/dL (ref 8–23)
CO2: 21 mmol/L — ABNORMAL LOW (ref 22–32)
Calcium: 8.9 mg/dL (ref 8.9–10.3)
Chloride: 101 mmol/L (ref 98–111)
Creatinine, Ser: 0.98 mg/dL (ref 0.44–1.00)
GFR, Estimated: >60 mL/min (ref >60)
Glucose, Bld: 132 mg/dL — ABNORMAL HIGH (ref 70–99)
Potassium: 4.6 mmol/L (ref 3.5–5.1)
Sodium: 134 mmol/L — ABNORMAL LOW (ref 135–145)
Total Bilirubin: 0.5 mg/dL (ref 0.3–1.2)
Total Protein: 7 g/dL (ref 6.5–8.1)

## 2022-05-24 MED ORDER — ONDANSETRON HCL 4 MG/2ML IJ SOLN
4.0000 mg | Freq: Once | INTRAMUSCULAR | Status: AC
  Administered 2022-05-24: 4 mg via INTRAVENOUS
  Filled 2022-05-24: qty 2

## 2022-05-24 MED ORDER — MORPHINE SULFATE (PF) 4 MG/ML IV SOLN
4.0000 mg | Freq: Once | INTRAVENOUS | Status: AC
  Administered 2022-05-24: 4 mg via INTRAVENOUS
  Filled 2022-05-24: qty 1

## 2022-05-24 NOTE — ED Triage Notes (Addendum)
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

## 2022-05-24 NOTE — ED Provider Triage Note (Signed)
Emergency Medicine Provider Triage Evaluation Note  Tiffany Cardenas , a 72 y.o. female  was evaluated in triage.  Pt complains of Severe right flank and back pain wrapping around into the right lower abdomen and groin.  Pain started suddenly today associated with nausea and 1 episode of vomiting.  Reports that she was diagnosed with UTI 1 week ago and just finished antibiotics.  She reports some urinary frequency but having very little urinary output.  Denies fever or chills.  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  Review of Systems  Positive: Flank pain, abdominal pain, nausea, vomiting Negative: Fever, hematuria  Physical Exam  BP (!) 147/60   Pulse 63   Temp 97.7 F (36.5 C)   Resp 17   SpO2 94%  Gen:   Awake, patient appears very uncomfortable Resp:  Normal effort  MSK:   Moves extremities without difficulty  Other:  Tenderness over the right flank and right lower abdomen worse with palpation  Medical Decision Making  Medically screening exam initiated at 6:21 PM.  Appropriate orders placed.  Clayborn Heron was informed that the remainder of the evaluation will be completed by another provider, this initial triage assessment does not replace that evaluation, and the importance of remaining in the ED until their evaluation is complete.  Labs, CT renal stone study and IV pain and nausea medication given   Dartha Lodge, PA-C 05/24/22 1823

## 2022-05-25 LAB — URINALYSIS, W/ REFLEX TO CULTURE (INFECTION SUSPECTED)
Bacteria, UA: NONE SEEN
Bilirubin Urine: NEGATIVE
Glucose, UA: NEGATIVE mg/dL
Hgb urine dipstick: NEGATIVE
Ketones, ur: NEGATIVE mg/dL
Leukocytes,Ua: NEGATIVE
Nitrite: NEGATIVE
Protein, ur: NEGATIVE mg/dL
Specific Gravity, Urine: 1.009 (ref 1.005–1.030)
pH: 6 (ref 5.0–8.0)

## 2022-05-25 LAB — LACTIC ACID, PLASMA: Lactic Acid, Venous: 1.7 mmol/L (ref 0.5–1.9)

## 2022-05-25 LAB — CBG MONITORING, ED: Glucose-Capillary: 129 mg/dL — ABNORMAL HIGH (ref 70–99)

## 2022-05-25 MED ORDER — ACETAMINOPHEN 325 MG PO TABS: 650.0000 mg | ORAL_TABLET | ORAL | Status: DC

## 2022-05-25 MED ORDER — ONDANSETRON 4 MG PO TBDP: 4.0000 mg | ORAL_TABLET | Freq: Three times a day (TID) | ORAL | 0 refills | Status: DC | PRN

## 2022-05-25 MED ORDER — MORPHINE SULFATE (PF) 4 MG/ML IV SOLN
4.0000 mg | Freq: Once | INTRAVENOUS | Status: AC
  Administered 2022-05-25: 4 mg via INTRAVENOUS
  Filled 2022-05-25: qty 1

## 2022-05-25 MED ORDER — KETOROLAC TROMETHAMINE 15 MG/ML IJ SOLN: 15.0000 mg | Freq: Once | INTRAMUSCULAR | Status: DC

## 2022-05-25 MED ORDER — ACETAMINOPHEN 500 MG PO TABS
1000.0000 mg | ORAL_TABLET | ORAL | Status: DC
  Filled 2022-05-25: qty 2

## 2022-05-25 MED ORDER — OXYCODONE-ACETAMINOPHEN 5-325 MG PO TABS: 1.0000 | ORAL_TABLET | Freq: Once | ORAL | Status: DC

## 2022-05-25 MED ORDER — ONDANSETRON HCL 4 MG/2ML IJ SOLN
4.0000 mg | Freq: Once | INTRAMUSCULAR | Status: AC
  Administered 2022-05-25: 4 mg via INTRAVENOUS
  Filled 2022-05-25: qty 2

## 2022-05-25 MED ORDER — ACETAMINOPHEN 500 MG PO TABS: 1000.0000 mg | ORAL_TABLET | ORAL | Status: DC

## 2022-05-25 MED ORDER — SODIUM CHLORIDE 0.9 % IV BOLUS
1000.0000 mL | Freq: Once | INTRAVENOUS | Status: AC
  Administered 2022-05-25: 1000 mL via INTRAVENOUS

## 2022-05-25 NOTE — ED Provider Notes (Cosign Needed Addendum)
[place] [facility] AT [facility] Provider Note   CSN: [id number] Arrival date & time: 05/24/22  1800     History  Chief Complaint  Patient presents with   Flank Pain    Tiffany Cardenas is a 72 y.o. female.   Flank Pain  Patient is a 72 year old female with past medical history significant for thyroid disease, gout, DM2, HTN, panic attacks, GAD, sciatica  She is present emergency room today with complaints of right flank pain that seems to radiate around her flank into her groin.  She denies any urinary frequency urgency dysuria hematuria.  No chest pain or difficulty breathing.  She endorses some nausea but no vomiting.     Home Medications Prior to Admission medications   Medication Sig Start Date End Date Taking? Authorizing Provider  ondansetron (ZOFRAN-ODT) 4 MG disintegrating tablet Take 1 tablet (4 mg total) by mouth every 8 (eight) hours as needed for nausea or vomiting. 05/25/22  Yes Alicea Wente, Rodrigo Ran, PA  ALPRAZolam (XANAX) 1 MG tablet Take 1 mg by mouth 4 (four) times daily. 03/22/18   [provider]  aspirin 81 MG chewable tablet Chew 81 mg by mouth daily.    [provider]  Calcium Carb-Cholecalciferol (CALCIUM 600 + D PO) Take 1 tablet by mouth daily.     [provider]  diclofenac (VOLTAREN) 75 MG EC tablet Take 1 tablet (75 mg total) by mouth 2 (two) times daily. Patient not taking: Reported on 06/20/2017 06/19/17   Wieters, Hallie C, PA-C  gabapentin (NEURONTIN) 300 MG capsule Take 1 capsule (300 mg total) by mouth 2 (two) times daily. 06/20/17   Elpidio Anis, PA-C  L-Lysine HCl 500 MG CAPS Take 500 mg by mouth daily.    [provider]  lisinopril (PRINIVIL,ZESTRIL) 20 MG tablet Take 1 tablet (20 mg total) by mouth daily. Patient not taking: Reported on 04/06/2018 06/19/17   Wieters, Fran Lowes C, PA-C  lisinopril-hydrochlorothiazide (PRINZIDE,ZESTORETIC) 10-12.5 MG tablet Take 1 tablet by mouth daily. 03/17/18  03/17/19  [provider]  metFORMIN (GLUCOPHAGE) 500 MG tablet Take 1 tablet (500 mg total) by mouth 2 (two) times daily with a meal. Patient not taking: Reported on 04/06/2018 09/04/16   Denzil Magnuson, NP  metFORMIN (GLUCOPHAGE-XR) 500 MG 24 hr tablet Take 1,000 mg by mouth every evening. With a meal 01/31/18 01/31/19  [provider]  methocarbamol (ROBAXIN) 500 MG tablet Take 1 tablet (500 mg total) by mouth 3 (three) times daily between meals as needed. Patient not taking: Reported on 06/20/2017 06/10/17   Rolland Porter, MD  methylPREDNISolone (MEDROL DOSEPAK) 4 MG TBPK tablet 6 po on day 1, decrease by 1 tab per day Patient not taking: Reported on 06/20/2017 06/10/17   Rolland Porter, MD  ondansetron [facility]) 4 MG tablet Take 4 mg by mouth every 8 (eight) hours as needed for nausea/vomiting. 04/03/18   [provider]  pantoprazole (PROTONIX) 20 MG tablet Take 1 tablet (20 mg total) by mouth daily. Patient not taking: Reported on 06/20/2017 04/17/17 06/20/25  Joy, Hillard Danker, PA-C  predniSONE (STERAPRED UNI-PAK 21 TAB) 10 MG (21) TBPK tablet Take by mouth daily. Take 6 tabs by mouth daily  for 2 days, then 5 tabs for 2 days, then 4 tabs for 2 days, then 3 tabs for 2 days, 2 tabs for 2 days, then 1 tab by mouth daily for 2 days Patient not taking: Reported on 04/06/2018 06/19/17   Lew Dawes, PA-C  senna-docusate (SENOKOT-S) 8.6-50 MG tablet Take 2 tablets by mouth daily as needed for constipation. 07/28/14   [provider]  thyroid (ARMOUR) 90 MG tablet Take 1 tablet (90 mg total) by mouth every morning. Patient taking differently: Take 90 mg by mouth daily.  09/04/16   Denzil Magnuson, NP  triamcinolone cream (KENALOG) 0.1 % Apply 1 application topically 2 (two) times daily. For 7 days 11/16/17   [provider]  XARELTO 10 MG TABS tablet Take 10 mg by mouth daily. Not taking yet 04/03/18   [provider]      Allergies    Hydroxyzine, Penicillins,  Sulfa antibiotics, Cymbalta [duloxetine hcl], Hydrocodone, Ivp dye [iodinated contrast media], Chymopapain, Mango flavor, and Papaya derivatives    Review of Systems   Review of Systems  Genitourinary:  Positive for flank pain.    Physical Exam Updated Vital Signs BP 138/70   Pulse 77   Temp 98.9 F (37.2 C) (Oral)   Resp 20   SpO2 95%  Physical Exam Vitals and nursing note reviewed.  Constitutional:      General: She is not in acute distress. HENT:     Head: Normocephalic and atraumatic.     Nose: Nose normal.  Eyes:     General: No scleral icterus. Cardiovascular:     Rate and Rhythm: Normal rate and regular rhythm.     Pulses: Normal pulses.     Heart sounds: Normal heart sounds.  Pulmonary:     Effort: Pulmonary effort is normal. No respiratory distress.     Breath sounds: No wheezing.  Abdominal:     Palpations: Abdomen is soft.     Tenderness: There is abdominal tenderness.     Comments: Diffuse nonfocal abdominal tenderness. No CVA tenderness.  Well-healed long remote surgical scar right upper abdomen  Musculoskeletal:     Cervical back: Normal range of motion.     Right lower leg: No edema.     Left lower leg: No edema.  Skin:    General: Skin is warm and dry.     Capillary Refill: Capillary refill takes less than 2 seconds.  Neurological:     Mental Status: She is alert. Mental status is at baseline.  Psychiatric:        Mood and Affect: Mood normal.        Behavior: Behavior normal.     ED Results / Procedures / Treatments   Labs (all labs ordered are listed, but only abnormal results are displayed) Labs Reviewed  CBC - Abnormal; Notable for the following components:      Result Value   RBC 3.57 (*)    Hemoglobin 11.5 (*)    HCT 34.6 (*)    All other components within normal limits  COMPREHENSIVE METABOLIC PANEL - Abnormal; Notable for the following components:   Sodium 134 (*)    CO2 21 (*)    Glucose, Bld 132 (*)    All other components  within normal limits  CBG MONITORING, ED - Abnormal; Notable for the following components:   Glucose-Capillary 129 (*)    All other components within normal limits  URINALYSIS, W/ REFLEX TO CULTURE (INFECTION SUSPECTED)  LACTIC ACID, PLASMA    EKG None  Radiology CT Renal Stone Study  Result Date: 05/24/2022 CLINICAL DATA:  Abdominal/flank pain, stone suspected Patient reports recent urinary tract infection diagnosis. EXAM: CT ABDOMEN AND PELVIS WITHOUT CONTRAST TECHNIQUE: Multidetector CT imaging of the abdomen and pelvis was performed following the  standard protocol without IV contrast. RADIATION DOSE REDUCTION: This exam was performed according to the departmental dose-optimization program which includes automated exposure control, adjustment of the mA and/or kV according to patient size and/or use of iterative reconstruction technique. COMPARISON:  CT 04/17/2017 FINDINGS: Lower chest: Minor atelectasis. No pleural effusion. Hepatobiliary: No focal liver abnormality on this unenhanced exam. Previous subcentimeter hypodensities not seen on the current exam. Prior cholecystectomy without biliary dilatation. Pancreas: No ductal dilatation or inflammation. Spleen: Normal in size. Scattered calcified granuloma. Adrenals/Urinary Tract: No adrenal nodule. No hydronephrosis or renal calculi. No significant perinephric edema. Mild bilateral renal parenchymal thinning. Both ureters are decompressed without ureteral stone. Partially distended urinary bladder. No bladder stone or wall thickening. Stomach/Bowel: No bowel obstruction or inflammation. Moderate formed stool within the transverse, descending and sigmoid colon. Scattered colonic diverticula without diverticulitis. Vascular/Lymphatic: Mild aortic and branch atherosclerosis. No aortic aneurysm. 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 No further imaging follow-up is needed. Shotty retroperitoneal lymph nodes, all subcentimeter  short axis, stable from 2019 exam. Reproductive: Status post hysterectomy. No adnexal masses. Other: Tiny fat containing umbilical hernia. No ascites or free air. No abdominopelvic collection. Musculoskeletal: Facet hypertrophy in the lower lumbar spine. There are no acute or suspicious osseous abnormalities. IMPRESSION: 1. No renal stones or obstructive uropathy. 2. Colonic diverticulosis without diverticulitis. Aortic Atherosclerosis (ICD10-I70.0). Electronically Signed   By: Narda Rutherford M.D.   On: 05/24/2022 19:42    Procedures Procedures    Medications Ordered in ED Medications  acetaminophen (TYLENOL) tablet 1,000 mg (1,000 mg Oral Not Given 05/25/22 0604)  morphine (PF) 4 MG/ML injection 4 mg (4 mg Intravenous Given 05/24/22 1827)  ondansetron (ZOFRAN) injection 4 mg (4 mg Intravenous Given 05/24/22 1827)  morphine (PF) 4 MG/ML injection 4 mg (4 mg Intravenous Given 05/25/22 0310)  ondansetron (ZOFRAN) injection 4 mg (4 mg Intravenous Given 05/25/22 0306)  sodium chloride 0.9 % bolus 1,000 mL (1,000 mLs Intravenous New Bag/Given 05/25/22 0308)    ED Course/ Medical Decision Making/ A&P Clinical Course as of 05/25/22 7829  Fri May 25, 2022  0243 7 days ago tx for UTI dx at PCP -   [WF]  0248 Macrobid  [WF]  0252 This afternoon at 3;15pm   [WF]    Clinical Course User Index [WF] Gailen Shelter, PA                             Medical Decision Making Amount and/or Complexity of Data Reviewed Labs: ordered.  Risk OTC drugs. Prescription drug management.   This patient presents to the ED for concern of flank pain, this involves a number of treatment options, and is a complaint that carries with it a moderate to high risk of complications and morbidity. A differential diagnosis was considered for the patient's symptoms which is discussed below:   The differential diagnosis of emergent flank pain includes, but is not limited to :Abdominal aortic aneurysm,, Renal artery  embolism,Renal vein thrombosis, Aortic dissection, Mesenteric ischemia, Pyelonephritis, Renal infarction, Renal hemorrhage, Nephrolithiasis/ Renal Colic, Bladder tumor,Cystitis, Biliary colic, Pancreatitis Perforated peptic ulcer Appendicitis ,Inguinal Hernia, Diverticulitis, Bowel obstruction (Ectopic Pregnancy,PID/TOA,Ovarian cyst, Ovarian torsion) Shingles Lower lobe pneumonia, Retroperitoneal hematoma/abscess/tumor, Epidural abscess, Epidural hematoma    Co morbidities: Discussed in HPI   Brief History:  Patient is a 73 year old female with past medical history significant for thyroid disease, gout, DM2, HTN, panic attacks, GAD, sciatica  She is  present emergency room today with complaints of right flank pain that seems to radiate around her flank into her groin.  She denies any urinary frequency urgency dysuria hematuria.  No chest pain or difficulty breathing.  She endorses some nausea but no vomiting.    EMR reviewed including pt PMHx, past surgical history and past visits to ER.   See HPI for more details   Lab Tests:   I ordered and independently interpreted labs. Labs notable for CBC without leukocytosis or anemia, CMP unremarkable, urinalysis without blood or evidence of infection, lactate within normal limits   Imaging Studies:  NAD. I personally reviewed all imaging studies and no acute abnormality found. I agree with radiology interpretation.    Cardiac Monitoring:  The patient was maintained on a cardiac monitor.  I personally viewed and interpreted the cardiac monitored which showed an underlying rhythm of: NSR NA   Medicines ordered:  I ordered medication including morphine x 2, Zofran, 1 L of crystalloid, Tylenol for pain Reevaluation of the patient after these medicines showed that the patient improved I have reviewed the patients home medicines and have made adjustments as needed   Critical Interventions:     Consults/Attending  Physician   I discussed this case with my attending physician who cosigned this note including patient's presenting symptoms, physical exam, and planned diagnostics and interventions. Attending physician stated agreement with plan or made changes to plan which were implemented.   Attending physician assessed patient at bedside.    Reevaluation:  After the interventions noted above I re-evaluated patient and found that they have :improved   Social Determinants of Health:      Problem List / ED Course:  Nausea vomiting and abdominal pain.  Patient with CT renal stone study that was normal, normal labs here.  Doubt renal artery infarct given that she is on Xarelto and has a normal lactate.  Doubt UTI given normal urine and I have low suspicion for PE as her symptoms are reproducible on palpation and seem to be exclusively abdominal rather than thoracic, also she is on Xarelto already for history of VTE and his symptoms are nonpleuritic she is not hypoxic cardiac  Vital signs normal at time of discharge, tolerating p.o.   Dispostion:  After consideration of the diagnostic results and the patients response to treatment, I feel that the patent would benefit from discharge with outpatient follow-up.   Final Clinical Impression(s) / ED Diagnoses Final diagnoses:  Flank pain    Rx / DC Orders ED Discharge Orders          Ordered    ondansetron (ZOFRAN-ODT) 4 MG disintegrating tablet  Every 8 hours PRN        05/25/22 0618               Gailen Shelter, PA 05/25/22 1610    Zadie Rhine, MD 05/26/22 [phone]

## 2022-05-25 NOTE — Discharge Instructions (Addendum)
Your workup today was reassuring however you will need to follow-up with your primary care doctor.  Drink plenty of water, take Tylenol and ibuprofen for pain.  Continue taking your gabapentin and other prescribed medications.  You were on multiple medications that can make you very drowsy including Robaxin, Xanax, gabapentin and I think it is likely best to hold off on any additional sedating medications especially since you are on a blood thinner.

## 2022-11-23 ENCOUNTER — Emergency Department (HOSPITAL_COMMUNITY): Payer: Medicare Other

## 2022-11-23 ENCOUNTER — Emergency Department (HOSPITAL_BASED_OUTPATIENT_CLINIC_OR_DEPARTMENT_OTHER): Payer: Medicare Other

## 2022-11-23 ENCOUNTER — Emergency Department (HOSPITAL_COMMUNITY)
Admission: EM | Admit: 2022-11-23 | Discharge: 2022-11-23 | Disposition: A | Discharge status: Home / Self Care | Payer: Medicare Other | Attending: Emergency Medicine | Admitting: Emergency Medicine

## 2022-11-23 ENCOUNTER — Encounter (HOSPITAL_COMMUNITY): Payer: Self-pay

## 2022-11-23 ENCOUNTER — Encounter (HOSPITAL_COMMUNITY): Payer: Medicare Other

## 2022-11-23 ENCOUNTER — Other Ambulatory Visit: Payer: Self-pay

## 2022-11-23 DIAGNOSIS — R109 Unspecified abdominal pain: Secondary | ICD-10-CM | POA: Insufficient documentation

## 2022-11-23 DIAGNOSIS — M7121 Synovial cyst of popliteal space [Baker], right knee: Secondary | ICD-10-CM | POA: Diagnosis not present

## 2022-11-23 DIAGNOSIS — M79661 Pain in right lower leg: Secondary | ICD-10-CM | POA: Diagnosis not present

## 2022-11-23 DIAGNOSIS — Z79899 Other long term (current) drug therapy: Secondary | ICD-10-CM | POA: Diagnosis not present

## 2022-11-23 DIAGNOSIS — G43109 Migraine with aura, not intractable, without status migrainosus: Secondary | ICD-10-CM

## 2022-11-23 DIAGNOSIS — Z7984 Long term (current) use of oral hypoglycemic drugs: Secondary | ICD-10-CM | POA: Diagnosis not present

## 2022-11-23 DIAGNOSIS — G43809 Other migraine, not intractable, without status migrainosus: Secondary | ICD-10-CM | POA: Diagnosis not present

## 2022-11-23 DIAGNOSIS — R519 Headache, unspecified: Secondary | ICD-10-CM | POA: Diagnosis present

## 2022-11-23 DIAGNOSIS — E119 Type 2 diabetes mellitus without complications: Secondary | ICD-10-CM | POA: Diagnosis not present

## 2022-11-23 DIAGNOSIS — Z7901 Long term (current) use of anticoagulants: Secondary | ICD-10-CM | POA: Insufficient documentation

## 2022-11-23 DIAGNOSIS — I10 Essential (primary) hypertension: Secondary | ICD-10-CM | POA: Diagnosis not present

## 2022-11-23 DIAGNOSIS — E039 Hypothyroidism, unspecified: Secondary | ICD-10-CM | POA: Insufficient documentation

## 2022-11-23 DIAGNOSIS — Z7982 Long term (current) use of aspirin: Secondary | ICD-10-CM | POA: Diagnosis not present

## 2022-11-23 LAB — COMPREHENSIVE METABOLIC PANEL
ALT: 22 U/L (ref 0–44)
AST: 21 U/L (ref 15–41)
Albumin: 3.3 g/dL — ABNORMAL LOW (ref 3.5–5.0)
Alkaline Phosphatase: 46 U/L (ref 38–126)
Anion gap: 11 (ref 5–15)
BUN: 20 mg/dL (ref 8–23)
CO2: 23 mmol/L (ref 22–32)
Calcium: 9 mg/dL (ref 8.9–10.3)
Chloride: 102 mmol/L (ref 98–111)
Creatinine, Ser: 1 mg/dL (ref 0.44–1.00)
GFR, Estimated: >60 mL/min (ref >60)
Glucose, Bld: 144 mg/dL — ABNORMAL HIGH (ref 70–99)
Potassium: 4.1 mmol/L (ref 3.5–5.1)
Sodium: 136 mmol/L (ref 135–145)
Total Bilirubin: 0.5 mg/dL (ref 0.3–1.2)
Total Protein: 6.7 g/dL (ref 6.5–8.1)

## 2022-11-23 LAB — URINALYSIS, ROUTINE W REFLEX MICROSCOPIC
Bilirubin Urine: NEGATIVE
Glucose, UA: NEGATIVE mg/dL
Hgb urine dipstick: NEGATIVE
Ketones, ur: NEGATIVE mg/dL
Nitrite: NEGATIVE
Protein, ur: NEGATIVE mg/dL
Specific Gravity, Urine: 1.005 (ref 1.005–1.030)
pH: 6 (ref 5.0–8.0)

## 2022-11-23 LAB — CBC WITH DIFFERENTIAL/PLATELET
Abs Immature Granulocytes: 0.02 10*3/uL (ref 0.00–0.07)
Basophils Absolute: 0.1 10*3/uL (ref 0.0–0.1)
Basophils Relative: 1 %
Eosinophils Absolute: 0.3 10*3/uL (ref 0.0–0.5)
Eosinophils Relative: 4 %
HCT: 31.8 % — ABNORMAL LOW (ref 36.0–46.0)
Hemoglobin: 10.6 g/dL — ABNORMAL LOW (ref 12.0–15.0)
Immature Granulocytes: 0 %
Lymphocytes Relative: 27 %
Lymphs Abs: 1.8 10*3/uL (ref 0.7–4.0)
MCH: 31.4 pg (ref 26.0–34.0)
MCHC: 33.3 g/dL (ref 30.0–36.0)
MCV: 94.1 fL (ref 80.0–100.0)
Monocytes Absolute: 0.6 10*3/uL (ref 0.1–1.0)
Monocytes Relative: 9 %
Neutro Abs: 3.9 10*3/uL (ref 1.7–7.7)
Neutrophils Relative %: 59 %
Platelets: 222 10*3/uL (ref 150–400)
RBC: 3.38 MIL/uL — ABNORMAL LOW (ref 3.87–5.11)
RDW: 13 % (ref 11.5–15.5)
WBC: 6.6 10*3/uL (ref 4.0–10.5)
nRBC: 0 % (ref 0.0–0.2)

## 2022-11-23 LAB — LIPASE, BLOOD: Lipase: 40 U/L (ref 11–51)

## 2022-11-23 LAB — RAPID URINE DRUG SCREEN, HOSP PERFORMED
Amphetamines: NOT DETECTED
Barbiturates: NOT DETECTED
Benzodiazepines: POSITIVE — AB
Cocaine: NOT DETECTED
Opiates: NOT DETECTED
Tetrahydrocannabinol: NOT DETECTED

## 2022-11-23 LAB — ETHANOL: Alcohol, Ethyl (B): <10 mg/dL (ref <10)

## 2022-11-23 MED ORDER — PROCHLORPERAZINE EDISYLATE 10 MG/2ML IJ SOLN
5.0000 mg | Freq: Once | INTRAMUSCULAR | Status: DC
  Filled 2022-11-23: qty 2

## 2022-11-23 MED ORDER — PROCHLORPERAZINE MALEATE 5 MG PO TABS: 5.0000 mg | ORAL_TABLET | Freq: Two times a day (BID) | ORAL | 0 refills | Status: DC | PRN

## 2022-11-23 MED ORDER — LORAZEPAM 1 MG PO TABS
2.0000 mg | ORAL_TABLET | Freq: Once | ORAL | Status: AC
  Administered 2022-11-23: 2 mg via ORAL
  Filled 2022-11-23: qty 2

## 2022-11-23 MED ORDER — DIPHENHYDRAMINE HCL 50 MG/ML IJ SOLN
25.0000 mg | Freq: Once | INTRAMUSCULAR | Status: DC
  Filled 2022-11-23: qty 1

## 2022-11-23 NOTE — Discharge Instructions (Signed)
Please follow up with your orthopedist  Follow up with neurology in regards to headaches  It was a pleasure caring for you today in the emergency department.  Please return to the emergency department for any worsening or worrisome symptoms.

## 2022-11-23 NOTE — ED Notes (Signed)
Pt states she woke up confused. She is A&O x 4 at time of triage.

## 2022-11-23 NOTE — ED Triage Notes (Signed)
Pt BIB [facility] from home.  Pt reports she went to bed around 0300 & woke at 0430 with right leg pain behind her knee moving towards her right thigh/hip.  Pt reports some nausea.  EMS gave 4mg  Zofran via 18g in LAC. Pt reports hx of blood clots but isn't on medication to treat them.   CBG 142 BP 151/71 HR 60 94% RA

## 2022-11-23 NOTE — ED Notes (Signed)
Patient returned from CT, stretcher locked in lowest position, pt in position of comfort with call bell in reach.  Family at bedside.

## 2022-11-23 NOTE — ED Notes (Signed)
Patient transported to CT 

## 2022-11-23 NOTE — Progress Notes (Signed)
RLE venous duplex has been completed.  Preliminary findings given to Dr. Wallace Cullens.   Results can be found under chart review under CV PROC. 11/23/2022 11:52 AM Vence Lalor RVT, RDMS

## 2022-11-23 NOTE — ED Notes (Signed)
Walked patient to the bathroom patient did well patient is now back in bed on the monitor with call bell in reach and family at bedside 

## 2022-11-23 NOTE — ED Notes (Signed)
Patient transported to Ultrasound 

## 2022-11-23 NOTE — ED Provider Notes (Signed)
[facility] AT [facility] Provider Note  CSN: [id number] Arrival date & time: 11/23/22 2952  Chief Complaint(s) Leg Pain  HPI Tiffany Cardenas is a 72 y.o. female with past medical history as below, significant for DVT, DM, GAD, hypertension, hypothyroid, MDD who presents to the ED with complaint of myriad of complaints  Patient here with multiple complaints for reports she goes to bed around 3 AM, woke up around 4 AM with a headache, felt her lower right lip was numb and tingly, tingling went down to her right shoulder and right elbow area.  Some numbness to the area as well.  She took Tylenol and the headache greatly improved and the tingling improved.  She then been having abdominal pain, generalized, felt like "something is moving around inside of my stomach."  She had some nausea and was given Zofran by EMS which improved her symptoms.  Also having pain behind her right knee, concerned that she has a blood clot.  No respiratory complaints.  Her numbness and tingling and headache have all improved but are still present at a low level, improved from onset.  Reports she is having some difficulty concentrating and focusing but no dysarthria  Past Medical History Past Medical History:  Diagnosis Date   Diabetes mellitus without complication (HCC)    DVT (deep venous thrombosis) (HCC) 2017   GAD (generalized anxiety disorder)    Gout    Hypertension    Hypothyroidism    Panic attacks    Sciatica    Thyroid disease    Patient Active Problem List   Diagnosis Date Noted   Withdrawal symptoms, drug or narcotic (HCC) 08/31/2016   MDD (major depressive disorder), recurrent, severe, with psychosis (HCC) 08/28/2016   MDD (major depressive disorder) 08/27/2016   Home Medication(s) Prior to Admission medications   Medication Sig Start Date End Date Taking? Authorizing Provider  prochlorperazine (COMPAZINE) 5 MG tablet Take 1 tablet (5 mg total) by mouth 2  (two) times daily as needed for nausea or vomiting. 11/23/22  Yes Sloan Leiter, DO  ALPRAZolam Prudy Feeler) 1 MG tablet Take 1 mg by mouth 4 (four) times daily. 03/22/18   [provider]  aspirin 81 MG chewable tablet Chew 81 mg by mouth daily.    [provider]  Calcium Carb-Cholecalciferol (CALCIUM 600 + D PO) Take 1 tablet by mouth daily.     [provider]  diclofenac (VOLTAREN) 75 MG EC tablet Take 1 tablet (75 mg total) by mouth 2 (two) times daily. Patient not taking: Reported on 06/20/2017 06/19/17   Wieters, Hallie C, PA-C  gabapentin (NEURONTIN) 300 MG capsule Take 1 capsule (300 mg total) by mouth 2 (two) times daily. 06/20/17   Elpidio Anis, PA-C  L-Lysine HCl 500 MG CAPS Take 500 mg by mouth daily.    [provider]  lisinopril (PRINIVIL,ZESTRIL) 20 MG tablet Take 1 tablet (20 mg total) by mouth daily. Patient not taking: Reported on 04/06/2018 06/19/17   Wieters, Fran Lowes C, PA-C  lisinopril-hydrochlorothiazide (PRINZIDE,ZESTORETIC) 10-12.5 MG tablet Take 1 tablet by mouth daily. 03/17/18 03/17/19  [provider]  metFORMIN (GLUCOPHAGE) 500 MG tablet Take 1 tablet (500 mg total) by mouth 2 (two) times daily with a meal. Patient not taking: Reported on 04/06/2018 09/04/16   Denzil Magnuson, NP  metFORMIN (GLUCOPHAGE-XR) 500 MG 24 hr tablet Take 1,000 mg by mouth every evening. With a meal 01/31/18 01/31/19  [provider]  methocarbamol (ROBAXIN) 500 MG  tablet Take 1 tablet (500 mg total) by mouth 3 (three) times daily between meals as needed. Patient not taking: Reported on 06/20/2017 06/10/17   Rolland Porter, MD  methylPREDNISolone (MEDROL DOSEPAK) 4 MG TBPK tablet 6 po on day 1, decrease by 1 tab per day Patient not taking: Reported on 06/20/2017 06/10/17   Rolland Porter, MD  ondansetron [facility]) 4 MG tablet Take 4 mg by mouth every 8 (eight) hours as needed for nausea/vomiting. 04/03/18   [provider]  ondansetron (ZOFRAN-ODT) 4 MG  disintegrating tablet Take 1 tablet (4 mg total) by mouth every 8 (eight) hours as needed for nausea or vomiting. 05/25/22   Gailen Shelter, PA  pantoprazole (PROTONIX) 20 MG tablet Take 1 tablet (20 mg total) by mouth daily. Patient not taking: Reported on 06/20/2017 04/17/17 06/20/25  Joy, Hillard Danker, PA-C  predniSONE (STERAPRED UNI-PAK 21 TAB) 10 MG (21) TBPK tablet Take by mouth daily. Take 6 tabs by mouth daily  for 2 days, then 5 tabs for 2 days, then 4 tabs for 2 days, then 3 tabs for 2 days, 2 tabs for 2 days, then 1 tab by mouth daily for 2 days Patient not taking: Reported on 04/06/2018 06/19/17   Wieters, Hallie C, PA-C  senna-docusate (SENOKOT-S) 8.6-50 MG tablet Take 2 tablets by mouth daily as needed for constipation. 07/28/14   [provider]  thyroid (ARMOUR) 90 MG tablet Take 1 tablet (90 mg total) by mouth every morning. Patient taking differently: Take 90 mg by mouth daily.  09/04/16   Denzil Magnuson, NP  triamcinolone cream (KENALOG) 0.1 % Apply 1 application topically 2 (two) times daily. For 7 days 11/16/17   [provider]  XARELTO 10 MG TABS tablet Take 10 mg by mouth daily. Not taking yet 04/03/18   [provider]                                                                                                                                    Past Surgical History Past Surgical History:  Procedure Laterality Date   ABDOMINAL HYSTERECTOMY     BACK SURGERY  2016   CHOLECYSTECTOMY     TONSILLECTOMY     Family History Family History  Problem Relation Age of Onset   Diabetes Father    Heart attack Father    Heart attack Paternal Uncle    Sudden Cardiac Death Paternal Uncle     Social History Social History   Tobacco Use   Smoking status: Never   Smokeless tobacco: Never  Vaping Use   Vaping status: Never Used  Substance Use Topics   Alcohol use: No   Drug use: No   Allergies Hydroxyzine, Penicillins, Sulfa antibiotics, Cymbalta  [duloxetine hcl], Hydrocodone, Ivp dye [iodinated contrast media], Chymopapain, Mango flavor, and Papaya derivatives  Review of Systems Review of Systems  Constitutional:  Negative for chills and fever.  Respiratory:  Negative for chest tightness and shortness of breath.   Cardiovascular:  Negative for chest pain.  Gastrointestinal:  Positive for abdominal pain, nausea and vomiting. Negative for anal bleeding and diarrhea.  Genitourinary:  Negative for dysuria.  Musculoskeletal:  Positive for arthralgias.  Neurological:  Positive for numbness and headaches.  Psychiatric/Behavioral:  Negative for agitation.   All other systems reviewed and are negative.   Physical Exam Vital Signs  I have reviewed the triage vital signs BP (!) 143/63   Pulse (!) 54   Temp 98 F (36.7 C) (Oral)   Resp 13   Ht 5\' 5"  (1.651 m)   Wt 90.7 kg   SpO2 100%   BMI 33.28 kg/m  Physical Exam Vitals and nursing note reviewed.  Constitutional:      General: She is not in acute distress.    Appearance: Normal appearance. She is obese.  HENT:     Head: Normocephalic and atraumatic.     Right Ear: External ear normal.     Left Ear: External ear normal.     Nose: Nose normal.     Mouth/Throat:     Mouth: Mucous membranes are moist.  Eyes:     General: No visual field deficit or scleral icterus.       Right eye: No discharge.        Left eye: No discharge.     Extraocular Movements: Extraocular movements intact.     Pupils: Pupils are equal, round, and reactive to light.  Cardiovascular:     Rate and Rhythm: Normal rate and regular rhythm.     Pulses: Normal pulses.     Heart sounds: Normal heart sounds.  Pulmonary:     Effort: Pulmonary effort is normal. No respiratory distress.     Breath sounds: Normal breath sounds. No stridor.  Abdominal:     General: Abdomen is flat. There is no distension.     Palpations: Abdomen is soft.     Tenderness: There is abdominal tenderness. There is no guarding  or rebound.    Musculoskeletal:     Cervical back: No rigidity.     Right lower leg: No edema.     Left lower leg: No edema.  Skin:    General: Skin is warm and dry.     Capillary Refill: Capillary refill takes less than 2 seconds.  Neurological:     Mental Status: She is alert and oriented to person, place, and time.     GCS: GCS eye subscore is 4. GCS verbal subscore is 5. GCS motor subscore is 6.     Cranial Nerves: Cranial nerves 2-12 are intact. No dysarthria or facial asymmetry.     Sensory: Sensory deficit present.     Motor: Motor function is intact. No weakness.     Coordination: Coordination is intact. Finger-Nose-Finger Test normal.     Comments: Reduced 2-point discrimination to entire right side of face, right upper arm and right lower extremity    Gait testing deferred secondary to patient safety.  Strength 5/5 bilateral upper and lower extremities  Psychiatric:        Mood and Affect: Mood normal.        Behavior: Behavior normal. Behavior is cooperative.     ED Results and Treatments Labs (all labs ordered are listed, but only abnormal results are displayed) Labs Reviewed  CBC WITH DIFFERENTIAL/PLATELET - Abnormal; Notable for the following components:      Result Value   RBC 3.38 (*)  Hemoglobin 10.6 (*)    HCT 31.8 (*)    All other components within normal limits  COMPREHENSIVE METABOLIC PANEL - Abnormal; Notable for the following components:   Glucose, Bld 144 (*)    Albumin 3.3 (*)    All other components within normal limits  URINALYSIS, ROUTINE W REFLEX MICROSCOPIC - Abnormal; Notable for the following components:   Color, Urine STRAW (*)    Leukocytes,Ua MODERATE (*)    Bacteria, UA RARE (*)    All other components within normal limits  RAPID URINE DRUG SCREEN, HOSP PERFORMED - Abnormal; Notable for the following components:   Benzodiazepines POSITIVE (*)    All other components within normal limits  LIPASE, BLOOD  ETHANOL                                                                                                                           Radiology No results found.  Pertinent labs & imaging results that were available during my care of the patient were reviewed by me and considered in my medical decision making (see MDM for details).  Medications Ordered in ED Medications  LORazepam (ATIVAN) tablet 2 mg (2 mg Oral Given 11/23/22 1031)                                                                                                                                     Procedures Procedures  (including critical care time)  Medical Decision Making / ED Course    Medical Decision Making:    LARICA YOGI is a 72 y.o. female with past medical history as below, significant for DVT, DM, GAD, hypertension, hypothyroid, MDD who presents to the ED with complaint of myriad of complaints. The complaint involves an extensive differential diagnosis and also carries with it a high risk of complications and morbidity.  Serious etiology was considered. Ddx includes but is not limited to: Differential diagnosis includes but is not exclusive to subarachnoid hemorrhage, meningitis, encephalitis, previous head trauma, cavernous venous thrombosis, muscle tension headache, glaucoma, temporal arteritis, migraine or migraine equivalent, etc. Differential diagnosis includes but is not exclusive to acute cholecystitis, intrathoracic causes for epigastric abdominal pain, gastritis, duodenitis, pancreatitis, small bowel or large bowel obstruction, abdominal aortic aneurysm, hernia, gastritis, etc.   Complete initial physical exam performed, notably the patient  was no acute distress, HDS.    Reviewed and confirmed nursing  documentation for past medical history, family history, social history.  Vital signs reviewed.    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 Headache has resolved along with arm/facial anesthesia/paresthesia. Will d/w Neuro, possibly complex migraine  [SG]    Clinical Course User Index [SG] Sloan Leiter, DO     Patient here with a myriad of complaints.  She is not candidate for TNK or thrombectomy given improvement to her symptoms from the onset.  Will get screening labs, CT imaging of abdomen, MRI of the brain.  Contrast allergy noted.  Symptoms greatly improved on recheck, she does not want migraine cocktail in the ER and will take medication when she gets home.  Discussed with Dr. Jerrell Belfast in regards to neurologic findings, recommends MRI which was completed and is unremarkable.  Outpatient follow-up with neurology for possible complicated migraine.  Baker cyst on [place], she f/w ortho, encouraged f/u.   Lab workup stable.  Imaging workup stable.  Outpatient follow-up was encouraged  The patient improved significantly and was discharged in stable condition. Detailed discussions were had with the patient regarding current findings, and need for close f/u with PCP or on call doctor. The patient has been instructed to return immediately if the symptoms worsen in any way for re-evaluation. Patient verbalized understanding and is in agreement with current care plan. All questions answered prior to discharge.                 Additional history obtained: -Additional history obtained from spouse -External records from outside source obtained and reviewed including: Chart review including previous notes, labs, imaging, consultation notes including  Primary care documentation, prior labs and imaging, home medications   Lab Tests: -I ordered, reviewed, and interpreted labs.   The pertinent results include:   Labs Reviewed  CBC WITH DIFFERENTIAL/PLATELET - Abnormal; Notable for the following components:      Result Value   RBC 3.38 (*)    Hemoglobin 10.6 (*)    HCT  31.8 (*)    All other components within normal limits  COMPREHENSIVE METABOLIC PANEL - Abnormal; Notable for the following components:   Glucose, Bld 144 (*)    Albumin 3.3 (*)    All other components within normal limits  URINALYSIS, ROUTINE W REFLEX MICROSCOPIC - Abnormal; Notable for the following components:   Color, Urine STRAW (*)    Leukocytes,Ua MODERATE (*)    Bacteria, UA RARE (*)    All other components within normal limits  RAPID URINE DRUG SCREEN, HOSP PERFORMED - Abnormal; Notable for the following components:   Benzodiazepines POSITIVE (*)    All other components within normal limits  LIPASE, BLOOD  ETHANOL    Notable for stable as above  EKG   EKG Interpretation Date/Time:  Friday November 23 2022 11:18:01 EDT Ventricular Rate:  55 PR Interval:  159 QRS Duration:  91 QT Interval:  433 QTC Calculation: 415 R Axis:   -8  Text Interpretation: Sinus bradycardia Nonspecific T abnrm, anterolateral leads Confirmed by Ernie Avena ([id number]) on 11/24/2022 1:23:59 PM         Imaging Studies ordered: I ordered imaging studies including MRI brain MRA head, duplex, CTAP w/o IVCON CTH w/o I independently visualized the following imaging with scope of interpretation limited to determining acute life threatening conditions related to  emergency care; findings noted above I independently visualized and interpreted imaging. I agree with the radiologist interpretation   Medicines ordered and prescription drug management: Meds ordered this encounter  Medications   LORazepam (ATIVAN) tablet 2 mg   DISCONTD: prochlorperazine (COMPAZINE) injection 5 mg   DISCONTD: diphenhydrAMINE (BENADRYL) injection 25 mg   prochlorperazine (COMPAZINE) 5 MG tablet    Sig: Take 1 tablet (5 mg total) by mouth 2 (two) times daily as needed for nausea or vomiting.    Dispense:  10 tablet    Refill:  0    -I have reviewed the patients home medicines and have made adjustments as  needed   Consultations Obtained: I requested consultation with the neuro,  and discussed lab and imaging findings as well as pertinent plan - they recommend: MRI, f/u   Cardiac Monitoring: The patient was maintained on a cardiac monitor.  I personally viewed and interpreted the cardiac monitored which showed an underlying rhythm of: NSR  Social Determinants of Health:  Diagnosis or treatment significantly limited by social determinants of health: obesity   Reevaluation: After the interventions noted above, I reevaluated the patient and found that they have improved  Co morbidities that complicate the patient evaluation  Past Medical History:  Diagnosis Date   Diabetes mellitus without complication (HCC)    DVT (deep venous thrombosis) (HCC) 2017   GAD (generalized anxiety disorder)    Gout    Hypertension    Hypothyroidism    Panic attacks    Sciatica    Thyroid disease       Dispostion: Disposition decision including need for hospitalization was considered, and patient discharged from emergency department.    Final Clinical Impression(s) / ED Diagnoses Final diagnoses:  Nonintractable headache, unspecified chronicity pattern, unspecified headache type  Synovial cyst of right popliteal space  Complicated migraine        Sloan Leiter, DO 11/25/22 0730

## 2022-11-26 NOTE — Plan of Care (Signed)
[facility] Tonsillectomy/Adenoidectomy, Postoperative PEDS care plan entered in error.

## 2022-12-19 ENCOUNTER — Emergency Department (HOSPITAL_COMMUNITY): Payer: Medicare Other

## 2022-12-19 ENCOUNTER — Other Ambulatory Visit: Payer: Self-pay

## 2022-12-19 ENCOUNTER — Emergency Department (HOSPITAL_COMMUNITY)
Admission: EM | Admit: 2022-12-19 | Discharge: 2022-12-19 | Disposition: A | Discharge status: Home / Self Care | Payer: Medicare Other | Attending: Emergency Medicine | Admitting: Emergency Medicine

## 2022-12-19 DIAGNOSIS — R109 Unspecified abdominal pain: Secondary | ICD-10-CM | POA: Diagnosis present

## 2022-12-19 DIAGNOSIS — N189 Chronic kidney disease, unspecified: Secondary | ICD-10-CM | POA: Insufficient documentation

## 2022-12-19 DIAGNOSIS — I129 Hypertensive chronic kidney disease with stage 1 through stage 4 chronic kidney disease, or unspecified chronic kidney disease: Secondary | ICD-10-CM | POA: Insufficient documentation

## 2022-12-19 DIAGNOSIS — M79604 Pain in right leg: Secondary | ICD-10-CM | POA: Diagnosis not present

## 2022-12-19 DIAGNOSIS — E119 Type 2 diabetes mellitus without complications: Secondary | ICD-10-CM | POA: Insufficient documentation

## 2022-12-19 DIAGNOSIS — R52 Pain, unspecified: Secondary | ICD-10-CM

## 2022-12-19 DIAGNOSIS — R202 Paresthesia of skin: Secondary | ICD-10-CM | POA: Diagnosis not present

## 2022-12-19 LAB — COMPREHENSIVE METABOLIC PANEL
ALT: 22 U/L (ref 0–44)
AST: 17 U/L (ref 15–41)
Albumin: 3.4 g/dL — ABNORMAL LOW (ref 3.5–5.0)
Alkaline Phosphatase: 53 U/L (ref 38–126)
Anion gap: 8 (ref 5–15)
BUN: 24 mg/dL — ABNORMAL HIGH (ref 8–23)
CO2: 22 mmol/L (ref 22–32)
Calcium: 8.9 mg/dL (ref 8.9–10.3)
Chloride: 104 mmol/L (ref 98–111)
Creatinine, Ser: 1.16 mg/dL — ABNORMAL HIGH (ref 0.44–1.00)
GFR, Estimated: 51 mL/min — ABNORMAL LOW (ref >60)
Glucose, Bld: 150 mg/dL — ABNORMAL HIGH (ref 70–99)
Potassium: 4.6 mmol/L (ref 3.5–5.1)
Sodium: 134 mmol/L — ABNORMAL LOW (ref 135–145)
Total Bilirubin: 0.7 mg/dL (ref <1.2)
Total Protein: 6.7 g/dL (ref 6.5–8.1)

## 2022-12-19 LAB — CBC
HCT: 35.6 % — ABNORMAL LOW (ref 36.0–46.0)
Hemoglobin: 12 g/dL (ref 12.0–15.0)
MCH: 32.3 pg (ref 26.0–34.0)
MCHC: 33.7 g/dL (ref 30.0–36.0)
MCV: 95.7 fL (ref 80.0–100.0)
Platelets: 301 10*3/uL (ref 150–400)
RBC: 3.72 MIL/uL — ABNORMAL LOW (ref 3.87–5.11)
RDW: 12.7 % (ref 11.5–15.5)
WBC: 9.8 10*3/uL (ref 4.0–10.5)
nRBC: 0 % (ref 0.0–0.2)

## 2022-12-19 LAB — LIPASE, BLOOD: Lipase: 137 U/L — ABNORMAL HIGH (ref 11–51)

## 2022-12-19 MED ORDER — KETOROLAC TROMETHAMINE 15 MG/ML IJ SOLN
15.0000 mg | Freq: Once | INTRAMUSCULAR | Status: AC
  Administered 2022-12-19: 15 mg via INTRAVENOUS
  Filled 2022-12-19: qty 1

## 2022-12-19 MED ORDER — ONDANSETRON HCL 4 MG/2ML IJ SOLN
4.0000 mg | Freq: Once | INTRAMUSCULAR | Status: AC
  Administered 2022-12-19: 4 mg via INTRAVENOUS
  Filled 2022-12-19: qty 2

## 2022-12-19 MED ORDER — ACETAMINOPHEN 500 MG PO TABS: 1000.0000 mg | ORAL_TABLET | Freq: Once | ORAL | Status: DC

## 2022-12-19 NOTE — ED Triage Notes (Signed)
Pt arrives to ED c/o spasm/cramping to right abdominal region and radiates to right leg. Pt states that she was sleeping and woke up form pain and tingling sensation, "It feels like something is moving around in me" Pt endorses nausea

## 2022-12-19 NOTE — ED Provider Notes (Signed)
[place] EMERGENCY DEPARTMENT AT [facility] Provider Note   CSN: [id number] Arrival date & time: 12/19/22  4098     History Chief Complaint  Patient presents with   Leg Pain   Abdominal Cramping    HPI LOTTI ANDLER is a 72 y.o. female presenting for abdominal pain leg pain. Endorses right sided tingling and pain and intermittent pain.  Both upper and lower extremity shooting pain since she awoke.  LKW 9PM. Nausea and LH.  Medical history of DVT, diabetes, hypertension, TIA, CKD, depression, substance use disorder per prior chart. Patient's recorded medical, surgical, social, medication list and allergies were reviewed in the Snapshot window as part of the initial history.   Review of Systems   Review of Systems  Constitutional:  Positive for fatigue. Negative for chills and fever.  HENT:  Negative for ear pain and sore throat.   Eyes:  Negative for pain and visual disturbance.  Respiratory:  Positive for shortness of breath. Negative for cough.   Cardiovascular:  Positive for palpitations. Negative for chest pain.  Gastrointestinal:  Positive for abdominal pain, constipation and nausea. Negative for vomiting.  Genitourinary:  Negative for dysuria and hematuria.  Musculoskeletal:  Negative for arthralgias and back pain.  Skin:  Negative for color change and rash.  Neurological:  Positive for numbness. Negative for seizures and syncope.  All other systems reviewed and are negative.   Physical Exam Updated Vital Signs BP (!) 145/57   Pulse (!) 59   Temp 98 F (36.7 C) (Oral)   Resp 16   Ht 5\' 5"  (1.651 m)   Wt 88.5 kg   SpO2 99%   BMI 32.45 kg/m  Physical Exam Vitals and nursing note reviewed.  Constitutional:      General: She is not in acute distress.    Appearance: She is well-developed.  HENT:     Head: Normocephalic and atraumatic.  Eyes:     Conjunctiva/sclera: Conjunctivae normal.  Cardiovascular:     Rate and Rhythm: Normal rate and  regular rhythm.     Heart sounds: No murmur heard. Pulmonary:     Effort: Pulmonary effort is normal. No respiratory distress.     Breath sounds: Normal breath sounds.  Abdominal:     General: There is no distension.     Palpations: Abdomen is soft.     Tenderness: There is no abdominal tenderness. There is no right CVA tenderness or left CVA tenderness.  Musculoskeletal:        General: No swelling or tenderness. Normal range of motion.     Cervical back: Neck supple.  Skin:    General: Skin is warm and dry.  Neurological:     General: No focal deficit present.     Mental Status: She is alert and oriented to person, place, and time. Mental status is at baseline.     Cranial Nerves: No cranial nerve deficit.      ED Course/ Medical Decision Making/ A&P    Procedures Procedures   Medications Ordered in ED Medications  ketorolac (TORADOL) 15 MG/ML injection 15 mg (15 mg Intravenous Given 12/19/22 0820)  ondansetron (ZOFRAN) injection 4 mg (4 mg Intravenous Given 12/19/22 1191)   Medical Decision Making:   CHELSEAANN SKOW is a 72 y.o. female who presented to the ED today with intermittent neurologic symptoms detailed above.    Patient placed on continuous vitals and telemetry monitoring while in ED which was reviewed periodically.  Complete initial physical  exam performed, notably the patient  was in no acute distress.    Reviewed and confirmed nursing documentation for past medical history, family history, social history.    Initial Assessment:   With the patient's presentation of altered mental status, most likely diagnosis is delerium 2/2 infectious etiology (UTI/CAP/URI) vs metabolic abnormality (Na/K/Mg/Ca) vs nonspecific etiology. Other diagnoses were considered including (but not limited to) CVA, ICH, intracranial mass, critical dehydration, heptatic dysfunction, uremia, hypercarbia, intoxication, endrocrine abnormality, toxidrome. These are considered less likely due to  history of present illness and physical exam findings.   This is most consistent with an acute life/limb threatening illness complicated by underlying chronic conditions.  Initial Plan:  CTH to evaluate for intracranial etiology of patient's symptoms  Screening labs including CBC and Metabolic panel to evaluate for infectious or metabolic etiology of disease.  X-ray abdomen to evaluate for structural/infectious intra-abdominal pathology.  X-ray right leg given reported pain at this location Lipase to rule out pancreatitis given intermittent abdominal nature Objective evaluation as below reviewed   Initial Study Results:   Laboratory  All laboratory results reviewed without evidence of clinically relevant pathology.    Radiology:  All images reviewed independently. Agree with radiology report at this time.   CT Head Wo Contrast  Result Date: 12/19/2022 CLINICAL DATA:  Headache, increasing frequency or severity EXAM: CT HEAD WITHOUT CONTRAST TECHNIQUE: Contiguous axial images were obtained from the base of the skull through the vertex without intravenous contrast. RADIATION DOSE REDUCTION: This exam was performed according to the departmental dose-optimization program which includes automated exposure control, adjustment of the mA and/or kV according to patient size and/or use of iterative reconstruction technique. COMPARISON:  Brain MR 11/23/22 FINDINGS: Brain: No hemorrhage. No hydrocephalus. No extra-axial fluid collection. There is no CT evidence of an acute cortical infarct. Mild overall chronic microvascular ischemic change. Enlarged and partially empty sella. Vascular: No hyperdense vessel or unexpected calcification. Skull: Normal. Negative for fracture or focal lesion. Sinuses/Orbits: No middle ear effusion. There is a trace right mastoid effusion. Mild mucosal thickening in the left maxillary sinus. Orbits are unremarkable. Other: None. IMPRESSION: No specific CT etiology for headaches  identified. Electronically Signed   By: Lorenza Cambridge M.D.   On: 12/19/2022 09:38   DG Abdomen 1 View  Result Date: 12/19/2022 CLINICAL DATA:  [id number]. Encounter for pain. Possible cyst on the right knee. Encounter for hip pain. EXAM: ABDOMEN - 1 VIEW; RIGHT FEMUR 2 VIEWS COMPARISON:  CT without contrast 11/23/2022, right knee series 06/09/2017. FINDINGS: Flat plate abdomen study in 2 films, 6:50 a.m.: The bowel gas pattern is nonobstructive with mild-to-moderate fecal stasis. No radio-opaque calculi or other significant radiographic abnormality are seen. 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 Abdominal aortic atherosclerosis. There is no supine evidence of free air. Degenerative change lower lumbar spine. There is partial joint space loss both superior hips, slightly greater on the right, without reactive osteophytes. AP and lateral right femoral series, total 4 films: The no fracture, dislocation or other focal bone lesions are noted of the right femur. A bone island is again seen in the right acetabulum. There is mild arthrosis of the right hip and right knee. No suprapatellar bursal effusion is evident. There is mild osteopenia. Soft tissues show no focal radiographic abnormality. IMPRESSION: 1. Nonobstructive bowel gas pattern with mild-to-moderate fecal stasis. 2. Aortic atherosclerosis. 3. 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 4. Mild arthrosis of the right hip and right knee. 5.  No acute radiographic findings of the right femur. Mild osteopenia. Electronically Signed   By: Almira Bar M.D.   On: 12/19/2022 07:07   DG FEMUR, MIN 2 VIEWS RIGHT  Result Date: 12/19/2022 CLINICAL DATA:  [id number]. Encounter for pain. Possible cyst on the right knee. Encounter for hip pain. EXAM: ABDOMEN - 1 VIEW; RIGHT FEMUR 2 VIEWS COMPARISON:  CT without contrast 11/23/2022, right knee series 06/09/2017. FINDINGS: Flat plate abdomen study in 2 films, 6:50 a.m.: The bowel gas pattern is  nonobstructive with mild-to-moderate fecal stasis. No radio-opaque calculi or other significant radiographic abnormality are seen. 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 Abdominal aortic atherosclerosis. There is no supine evidence of free air. Degenerative change lower lumbar spine. There is partial joint space loss both superior hips, slightly greater on the right, without reactive osteophytes. AP and lateral right femoral series, total 4 films: The no fracture, dislocation or other focal bone lesions are noted of the right femur. A bone island is again seen in the right acetabulum. There is mild arthrosis of the right hip and right knee. No suprapatellar bursal effusion is evident. There is mild osteopenia. Soft tissues show no focal radiographic abnormality. IMPRESSION: 1. Nonobstructive bowel gas pattern with mild-to-moderate fecal stasis. 2. Aortic atherosclerosis. 3. 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 4. Mild arthrosis of the right hip and right knee. 5. No acute radiographic findings of the right femur. Mild osteopenia. Electronically Signed   By: Almira Bar M.D.   On: 12/19/2022 07:07   MR BRAIN WO CONTRAST  Result Date: 11/23/2022 CLINICAL DATA:  Headache, neuro deficit EXAM: MRI HEAD WITHOUT CONTRAST MRA HEAD WITHOUT CONTRAST TECHNIQUE: Multiplanar, multi-echo pulse sequences of the brain and surrounding structures were acquired without intravenous contrast. Angiographic images of the Circle of Willis were acquired using MRA technique without intravenous contrast. COMPARISON:  Same-day CT head, MRA head 02/06/2017 FINDINGS: MRI HEAD FINDINGS Brain: There is no acute intracranial hemorrhage, extra-axial fluid collection, or acute infarct. Parenchymal volume is normal for age. The ventricles are normal in size. There is mild FLAIR signal abnormality in the subcortical and periventricular white matter likely reflecting sequela of mild chronic small-vessel ischemic  change, not significantly changed since 2019. The pituitary and suprasellar region are normal. There is no solid mass lesion. There is no mass effect or midline shift. Vascular: Normal flow voids. Skull and upper cervical spine: Normal marrow signal. Sinuses/Orbits: The paranasal sinuses are clear. The globes and orbits are unremarkable. Other: There are bilateral mastoid effusions, nonspecific. No obstructing nasopharyngeal mass is seen. MRA HEAD FINDINGS Anterior circulation: The intracranial ICAs are patent. The bilateral MCAs are patent, without proximal stenosis or occlusion. The bilateral ACAS are patent, without proximal stenosis or occlusion. There is no aneurysm or AVM. Posterior circulation: The bilateral V4 segments are patent. The basilar artery is patent. The major cerebellar arteries appear patent. The bilateral PCAs are patent, without proximal stenosis or occlusion. There is a fetal origin of the left PCA as well as a diminutive right posterior communicating artery. There is no aneurysm or AVM. Anatomic variants: As above. IMPRESSION: 1. Essentially normal for age brain MRI with mild background chronic small-vessel ischemic change but no acute intracranial pathology. 2.  Normal intracranial MRA. Electronically Signed   By: Lesia Hausen M.D.   On: 11/23/2022 12:30   MR ANGIO HEAD WO CONTRAST  Result Date: 11/23/2022 CLINICAL DATA:  Headache, neuro deficit EXAM: MRI HEAD WITHOUT CONTRAST MRA HEAD  WITHOUT CONTRAST TECHNIQUE: Multiplanar, multi-echo pulse sequences of the brain and surrounding structures were acquired without intravenous contrast. Angiographic images of the Circle of Willis were acquired using MRA technique without intravenous contrast. COMPARISON:  Same-day CT head, MRA head 02/06/2017 FINDINGS: MRI HEAD FINDINGS Brain: There is no acute intracranial hemorrhage, extra-axial fluid collection, or acute infarct. Parenchymal volume is normal for age. The ventricles are normal in size.  There is mild FLAIR signal abnormality in the subcortical and periventricular white matter likely reflecting sequela of mild chronic small-vessel ischemic change, not significantly changed since 2019. The pituitary and suprasellar region are normal. There is no solid mass lesion. There is no mass effect or midline shift. Vascular: Normal flow voids. Skull and upper cervical spine: Normal marrow signal. Sinuses/Orbits: The paranasal sinuses are clear. The globes and orbits are unremarkable. Other: There are bilateral mastoid effusions, nonspecific. No obstructing nasopharyngeal mass is seen. MRA HEAD FINDINGS Anterior circulation: The intracranial ICAs are patent. The bilateral MCAs are patent, without proximal stenosis or occlusion. The bilateral ACAS are patent, without proximal stenosis or occlusion. There is no aneurysm or AVM. Posterior circulation: The bilateral V4 segments are patent. The basilar artery is patent. The major cerebellar arteries appear patent. The bilateral PCAs are patent, without proximal stenosis or occlusion. There is a fetal origin of the left PCA as well as a diminutive right posterior communicating artery. There is no aneurysm or AVM. Anatomic variants: As above. IMPRESSION: 1. Essentially normal for age brain MRI with mild background chronic small-vessel ischemic change but no acute intracranial pathology. 2.  Normal intracranial MRA. Electronically Signed   By: Lesia Hausen M.D.   On: 11/23/2022 12:30   VAS [place] LOWER EXTREMITY VENOUS (DVT) (7a-7p)  Result Date: 11/23/2022  Lower Venous DVT Study Patient Name:  DEIONA HYMON  Date of Exam:   11/23/2022 Medical Rec #: [id number]        Accession #:    [id number] Date of Birth: [birth year]       Patient Gender: F Patient Age:   28 years Exam Location:  [facility] Procedure:      VAS [place] LOWER EXTREMITY VENOUS (DVT) Referring Phys: Tanda Rockers --------------------------------------------------------------------------------   Indications: Pain.  Risk Factors: DVT LLE (2016). Comparison Study: Previous exam on 08/01/2018 was negative for DVT Performing Technologist: Ernestene Mention RVT, RDMS  Examination Guidelines: A complete evaluation includes B-mode imaging, spectral Doppler, color Doppler, and power Doppler as needed of all accessible portions of each vessel. Bilateral testing is considered an integral part of a complete examination. Limited examinations for reoccurring indications may be performed as noted. The reflux portion of the exam is performed with the patient in reverse Trendelenburg.  +---------+---------------+---------+-----------+----------+--------------+ RIGHT    CompressibilityPhasicitySpontaneityPropertiesThrombus Aging +---------+---------------+---------+-----------+----------+--------------+ CFV      Full           Yes      Yes                                 +---------+---------------+---------+-----------+----------+--------------+ SFJ      Full                                                        +---------+---------------+---------+-----------+----------+--------------+ FV Prox  Full  Yes      Yes                                 +---------+---------------+---------+-----------+----------+--------------+ FV Mid   Full           Yes      Yes                                 +---------+---------------+---------+-----------+----------+--------------+ FV DistalFull           Yes      Yes                                 +---------+---------------+---------+-----------+----------+--------------+ PFV      Full                                                        +---------+---------------+---------+-----------+----------+--------------+ POP      Full           Yes      Yes                                 +---------+---------------+---------+-----------+----------+--------------+ PTV      Full                                                         +---------+---------------+---------+-----------+----------+--------------+ PERO     Full                                                        +---------+---------------+---------+-----------+----------+--------------+   +----+---------------+---------+-----------+----------+--------------+ LEFTCompressibilityPhasicitySpontaneityPropertiesThrombus Aging +----+---------------+---------+-----------+----------+--------------+ CFV Full           Yes      Yes                                 +----+---------------+---------+-----------+----------+--------------+     Summary: RIGHT: - There is no evidence of deep vein thrombosis in the lower extremity.  - Cystic structures found in the popliteal fossa largest measuring (3.6 x 1.6 x 2.3 cm)..  LEFT: - No evidence of common femoral vein obstruction.   *See table(s) above for measurements and observations. Electronically signed by Sherald Hess MD on 11/23/2022 at 12:12:02 PM.    Final    CT ABDOMEN PELVIS WO CONTRAST  Result Date: 11/23/2022 CLINICAL DATA:  Abdominal pain, acute, nonlocalized difuse pain EXAM: CT ABDOMEN AND PELVIS WITHOUT CONTRAST TECHNIQUE: Multidetector CT imaging of the abdomen and pelvis was performed following the standard protocol without IV contrast. RADIATION DOSE REDUCTION: This exam was performed according to the departmental dose-optimization program which includes automated exposure control, adjustment of the mA and/or kV according to patient size and/or use of iterative reconstruction  technique. COMPARISON:  CT abdomen/pelvis dated May 24, 2022. CT abdomen/pelvis dated April 17, 2017. FINDINGS: Lower chest: Calcified left lower lobe granuloma. No acute abnormality. Hepatobiliary: No focal liver abnormality is seen. Status post cholecystectomy. No biliary dilatation. Pancreas: Unremarkable. No pancreatic ductal dilatation or surrounding inflammatory changes. Spleen: The spleen is enlarged measuring up to 12.7 cm  in craniocaudal dimension. Punctate calcified granulomas. Adrenals/Urinary Tract: Adrenal glands are unremarkable. Kidneys are normal, without renal calculi, suspicious focal lesion, or hydronephrosis. Bladder is unremarkable. Stomach/Bowel: Stomach is within normal limits. Appendix is not visualized may be surgically absent. No evidence of bowel wall thickening, distention, or inflammatory changes. There are a few scattered sigmoid colonic diverticula without evidence of acute diverticulitis. Vascular/Lymphatic: Aortic atherosclerosis. Stable calcified 7 mm right renal artery aneurysm, for which no further imaging follow-up is needed. Essentially unchanged scattered prominent retroperitoneal lymph nodes, not enlarged by size criteria and similar compared to the prior exam in 2019. Reproductive: Status post hysterectomy. No adnexal masses. Other: Small fat containing umbilical hernia. No abdominopelvic ascites. Musculoskeletal: No acute osseous abnormality. No suspicious osseous lesion. Multilevel degenerative changes of the thoracolumbar spine. Degenerative changes of the bilateral hips. IMPRESSION: 1. No acute localizing findings in the abdomen or pelvis. 2. Mild splenomegaly. 3.  Aortic Atherosclerosis (ICD10-I70.0). Electronically Signed   By: Hart Robinsons M.D.   On: 11/23/2022 10:41   CT Head Wo Contrast  Result Date: 11/23/2022 CLINICAL DATA:  Headache, increasing frequency or severity complex migraine. EXAM: CT HEAD WITHOUT CONTRAST TECHNIQUE: Contiguous axial images were obtained from the base of the skull through the vertex without intravenous contrast. RADIATION DOSE REDUCTION: This exam was performed according to the departmental dose-optimization program which includes automated exposure control, adjustment of the mA and/or kV according to patient size and/or use of iterative reconstruction technique. COMPARISON:  Head CT 07/16/2017. FINDINGS: Brain: No acute hemorrhage. Cortical gray-white  differentiation is preserved. Patchy hypoattenuation of the periventricular white matter, most consistent with mild chronic small-vessel disease. Prominence of the ventricles and sulci within normal limits for age. No extra-axial collection. Basilar cisterns are patent. Vascular: No hyperdense vessel or unexpected calcification. Skull: No calvarial fracture or suspicious bone lesion. Skull base is unremarkable. Sinuses/Orbits: No acute finding. Other: None. IMPRESSION: 1. No acute intracranial abnormality. 2. Mild chronic small-vessel disease. Electronically Signed   By: Orvan Falconer M.D.   On: 11/23/2022 09:28      Final Assessment and Plan:   Lab work all grossly at baseline.  Patient observed for 4 hours in the emergency department remained at her mental status baseline with no further neurologic symptoms.  Stroke very unlikely given the intermittent nature of the symptoms as well as the localization. No acute distress, serial reassessment stable for outpatient care likely nonspecific muscular etiology. Disposition:  I have considered need for hospitalization, however, considering all of the above, I believe this patient is stable for discharge at this time.  Patient/family educated about specific return precautions for given chief complaint and symptoms.  Patient/family educated about follow-up with PC.     Patient/family expressed understanding of return precautions and need for follow-up. Patient spoken to regarding all imaging and laboratory results and appropriate follow up for these results. All education provided in verbal form with additional information in written form. Time was allowed for answering of patient questions. Patient discharged.    Emergency Department Medication Summary:   Medications  ketorolac (TORADOL) 15 MG/ML injection 15 mg (15 mg Intravenous Given 12/19/22 0820)  ondansetron (ZOFRAN)  injection 4 mg (4 mg Intravenous Given 12/19/22 0821)           Clinical  Impression:  1. Pain      Discharge   Final Clinical Impression(s) / ED Diagnoses Final diagnoses:  Pain    Rx / DC Orders ED Discharge Orders     None         Glyn Ade, MD 12/19/22 1017

## 2023-02-26 ENCOUNTER — Other Ambulatory Visit: Payer: Self-pay

## 2023-02-26 ENCOUNTER — Emergency Department (HOSPITAL_COMMUNITY): Payer: Medicare Other

## 2023-02-26 ENCOUNTER — Emergency Department (HOSPITAL_COMMUNITY)
Admission: EM | Admit: 2023-02-26 | Discharge: 2023-02-26 | Disposition: A | Discharge status: Home / Self Care | Payer: Medicare Other | Attending: Emergency Medicine | Admitting: Emergency Medicine

## 2023-02-26 ENCOUNTER — Emergency Department (HOSPITAL_BASED_OUTPATIENT_CLINIC_OR_DEPARTMENT_OTHER): Payer: Medicare Other

## 2023-02-26 DIAGNOSIS — E039 Hypothyroidism, unspecified: Secondary | ICD-10-CM | POA: Insufficient documentation

## 2023-02-26 DIAGNOSIS — M7989 Other specified soft tissue disorders: Secondary | ICD-10-CM | POA: Diagnosis not present

## 2023-02-26 DIAGNOSIS — I10 Essential (primary) hypertension: Secondary | ICD-10-CM | POA: Insufficient documentation

## 2023-02-26 DIAGNOSIS — M79605 Pain in left leg: Secondary | ICD-10-CM | POA: Diagnosis present

## 2023-02-26 DIAGNOSIS — E119 Type 2 diabetes mellitus without complications: Secondary | ICD-10-CM | POA: Diagnosis not present

## 2023-02-26 DIAGNOSIS — Z7982 Long term (current) use of aspirin: Secondary | ICD-10-CM | POA: Insufficient documentation

## 2023-02-26 LAB — COMPREHENSIVE METABOLIC PANEL
ALT: 48 U/L — ABNORMAL HIGH (ref 0–44)
AST: 44 U/L — ABNORMAL HIGH (ref 15–41)
Albumin: 3.6 g/dL (ref 3.5–5.0)
Alkaline Phosphatase: 55 U/L (ref 38–126)
Anion gap: 9 (ref 5–15)
BUN: 21 mg/dL (ref 8–23)
CO2: 25 mmol/L (ref 22–32)
Calcium: 9.4 mg/dL (ref 8.9–10.3)
Chloride: 104 mmol/L (ref 98–111)
Creatinine, Ser: 1.1 mg/dL — ABNORMAL HIGH (ref 0.44–1.00)
GFR, Estimated: 54 mL/min — ABNORMAL LOW (ref >60)
Glucose, Bld: 122 mg/dL — ABNORMAL HIGH (ref 70–99)
Potassium: 4.1 mmol/L (ref 3.5–5.1)
Sodium: 138 mmol/L (ref 135–145)
Total Bilirubin: 0.9 mg/dL (ref 0.0–1.2)
Total Protein: 7.2 g/dL (ref 6.5–8.1)

## 2023-02-26 LAB — CBC WITH DIFFERENTIAL/PLATELET
Abs Immature Granulocytes: 0.02 10*3/uL (ref 0.00–0.07)
Basophils Absolute: 0.1 10*3/uL (ref 0.0–0.1)
Basophils Relative: 1 %
Eosinophils Absolute: 0.3 10*3/uL (ref 0.0–0.5)
Eosinophils Relative: 5 %
HCT: 34.3 % — ABNORMAL LOW (ref 36.0–46.0)
Hemoglobin: 11.5 g/dL — ABNORMAL LOW (ref 12.0–15.0)
Immature Granulocytes: 0 %
Lymphocytes Relative: 22 %
Lymphs Abs: 1.4 10*3/uL (ref 0.7–4.0)
MCH: 33 pg (ref 26.0–34.0)
MCHC: 33.5 g/dL (ref 30.0–36.0)
MCV: 98.6 fL (ref 80.0–100.0)
Monocytes Absolute: 0.5 10*3/uL (ref 0.1–1.0)
Monocytes Relative: 7 %
Neutro Abs: 4.3 10*3/uL (ref 1.7–7.7)
Neutrophils Relative %: 65 %
Platelets: 260 10*3/uL (ref 150–400)
RBC: 3.48 MIL/uL — ABNORMAL LOW (ref 3.87–5.11)
RDW: 13.4 % (ref 11.5–15.5)
WBC: 6.6 10*3/uL (ref 4.0–10.5)
nRBC: 0 % (ref 0.0–0.2)

## 2023-02-26 LAB — TROPONIN I (HIGH SENSITIVITY)
Troponin I (High Sensitivity): 5 ng/L (ref <18)
Troponin I (High Sensitivity): 4 ng/L (ref <18)

## 2023-02-26 MED ORDER — DIPHENHYDRAMINE HCL 50 MG/ML IJ SOLN: 50.0000 mg | Freq: Once | INTRAMUSCULAR | Status: AC

## 2023-02-26 MED ORDER — ONDANSETRON HCL 4 MG/2ML IJ SOLN
4.0000 mg | Freq: Once | INTRAMUSCULAR | Status: AC
  Administered 2023-02-26: 4 mg via INTRAVENOUS
  Filled 2023-02-26: qty 2

## 2023-02-26 MED ORDER — MORPHINE SULFATE (PF) 4 MG/ML IV SOLN
4.0000 mg | Freq: Once | INTRAVENOUS | Status: AC
  Administered 2023-02-26: 4 mg via INTRAVENOUS
  Filled 2023-02-26: qty 1

## 2023-02-26 MED ORDER — OXYCODONE-ACETAMINOPHEN 5-325 MG PO TABS: 1.0000 | ORAL_TABLET | Freq: Once | ORAL | Status: DC

## 2023-02-26 MED ORDER — DIPHENHYDRAMINE HCL 25 MG PO CAPS
50.0000 mg | ORAL_CAPSULE | Freq: Once | ORAL | Status: AC
  Administered 2023-02-26: 50 mg via ORAL
  Filled 2023-02-26: qty 2

## 2023-02-26 MED ORDER — IOHEXOL 350 MG/ML SOLN
75.0000 mL | Freq: Once | INTRAVENOUS | Status: AC | PRN
  Administered 2023-02-26: 75 mL via INTRAVENOUS

## 2023-02-26 MED ORDER — METHYLPREDNISOLONE SODIUM SUCC 40 MG IJ SOLR
40.0000 mg | Freq: Once | INTRAMUSCULAR | Status: AC
  Administered 2023-02-26: 40 mg via INTRAVENOUS
  Filled 2023-02-26: qty 1

## 2023-02-26 NOTE — Discharge Instructions (Signed)
Make sure to follow-up with the orthopedist  Return for new or worsening symptoms

## 2023-02-26 NOTE — ED Provider Notes (Signed)
Tiffany Cardenas EMERGENCY DEPARTMENT AT [facility] Provider Note   CSN: [id number] Arrival date & time: 02/26/23  0740     History  Chief Complaint  Patient presents with   Leg Pain   Knee Pain   Shortness of Breath    Tiffany Cardenas is a 73 y.o. female.  73 year old female presents today for concern of left leg pain and swelling.  She states she did have a recent fall a few weeks ago.  Also endorses some shortness of breath.  Does have history of DVT.  Denies active pleuritic chest pain.  The history is provided by the patient. No language interpreter was used.       Home Medications Prior to Admission medications   Medication Sig Start Date End Date Taking? Authorizing Provider  acetaminophen (TYLENOL) 500 MG tablet Take 500 mg by mouth every 6 (six) hours as needed for mild pain (pain score 1-3) or moderate pain (pain score 4-6).   Yes [provider]  ALPRAZolam Prudy Feeler) 1 MG tablet Take 1 mg by mouth 4 (four) times daily. 03/22/18  Yes [provider]  ARMOUR THYROID 60 MG tablet Take 90 mg by mouth daily before breakfast. 08/30/17  Yes [provider]  aspirin EC 81 MG tablet Take 81 mg by mouth daily. 06/18/14 09/11/23 Yes [provider]  atorvastatin (LIPITOR) 10 MG tablet Take 10 mg by mouth daily. 07/14/14  Yes [provider]  carvedilol (COREG) 6.25 MG tablet Take 6.25 mg by mouth 2 (two) times daily with a meal. 04/03/21  Yes [provider]  L-Lysine HCl 500 MG CAPS Take 500 mg by mouth daily.   Yes [provider]  losartan (COZAAR) 50 MG tablet Take 50 mg by mouth daily. 07/05/22  Yes [provider]  metFORMIN (GLUCOPHAGE-XR) 750 MG 24 hr tablet Take 750 mg by mouth 2 (two) times daily. 01/07/22  Yes [provider]  senna-docusate (SENOKOT-S) 8.6-50 MG tablet Take 2 tablets by mouth daily as needed for constipation. 07/28/14  Yes [provider]      Allergies     Hydroxyzine, Mango flavoring agent (non-screening), Penicillins, Sulfa antibiotics, Sulfacetamide sodium, Amlodipine, Duloxetine hcl, Ioxaglate, Ivp dye [iodinated contrast media], Chymopapain, Hydrocodone, Papaya, and Papaya derivatives    Review of Systems   Review of Systems  Constitutional:  Negative for chills and fever.  Respiratory:  Positive for shortness of breath.   Cardiovascular:  Negative for chest pain.  Musculoskeletal:  Positive for arthralgias.  Neurological:  Negative for light-headedness.  All other systems reviewed and are negative.   Physical Exam Updated Vital Signs BP (!) 171/91   Pulse 86   Temp 98.2 F (36.8 C) (Oral)   Resp 17   Ht 5\' 5"  (1.651 m)   Wt 93 kg   SpO2 98%   BMI 34.11 kg/m  Physical Exam Vitals and nursing note reviewed.  Constitutional:      General: She is not in acute distress.    Appearance: Normal appearance. She is not ill-appearing.  HENT:     Head: Normocephalic and atraumatic.     Nose: Nose normal.  Eyes:     Conjunctiva/sclera: Conjunctivae normal.  Cardiovascular:     Rate and Rhythm: Normal rate and regular rhythm.  Pulmonary:     Effort: Pulmonary effort is normal. No respiratory distress.  Abdominal:     Palpations: Abdomen is soft.  Musculoskeletal:        General: No deformity.  Normal range of motion.     Cervical back: Normal range of motion.     Right lower leg: No edema.     Left lower leg: Edema present.  Skin:    Findings: No rash.  Neurological:     Mental Status: She is alert.     ED Results / Procedures / Treatments   Labs (all labs ordered are listed, but only abnormal results are displayed) Labs Reviewed  CBC WITH DIFFERENTIAL/PLATELET - Abnormal; Notable for the following components:      Result Value   RBC 3.48 (*)    Hemoglobin 11.5 (*)    HCT 34.3 (*)    All other components within normal limits  COMPREHENSIVE METABOLIC PANEL - Abnormal; Notable for the following components:    Glucose, Bld 122 (*)    Creatinine, Ser 1.10 (*)    AST 44 (*)    ALT 48 (*)    GFR, Estimated 54 (*)    All other components within normal limits  TROPONIN I (HIGH SENSITIVITY)  TROPONIN I (HIGH SENSITIVITY)    EKG None  Radiology CT Angio Chest PE W and/or Wo Contrast Result Date: 02/26/2023 CLINICAL DATA:  Pulmonary embolism (PE) suspected, high prob EXAM: CT ANGIOGRAPHY CHEST WITH CONTRAST TECHNIQUE: Multidetector CT imaging of the chest was performed using the standard protocol during bolus administration of intravenous contrast. Multiplanar CT image reconstructions and MIPs were obtained to evaluate the vascular anatomy. RADIATION DOSE REDUCTION: This exam was performed according to the departmental dose-optimization program which includes automated exposure control, adjustment of the mA and/or kV according to patient size and/or use of iterative reconstruction technique. CONTRAST:  75mL OMNIPAQUE IOHEXOL 350 MG/ML SOLN COMPARISON:  CTA chest dated April 06, 2018. FINDINGS: Cardiovascular: Satisfactory opacification of the pulmonary arteries to the segmental level. No evidence of pulmonary embolism. Normal heart size. No pericardial effusion. Atherosclerotic calcification of the thoracic aorta and arch branch vessels. Mediastinum/Nodes: No enlarged mediastinal, hilar, or axillary lymph nodes. Trachea and esophagus demonstrate no significant findings. Lungs/Pleura: Mild bibasilar atelectasis. Large calcified granuloma again noted in the left lower lobe. No focal consolidation, pleural effusion, or pneumothorax. No suspicious pulmonary nodule. Upper Abdomen: No acute abnormality. Musculoskeletal: No chest wall abnormality. No acute osseous abnormality. No suspicious osseous lesion. Multilevel degenerative changes of the thoracic spine. Review of the MIP images confirms the above findings. IMPRESSION: 1. No evidence of pulmonary embolism. 2. No acute intrathoracic findings. 3.  Aortic  Atherosclerosis (ICD10-I70.0). Electronically Signed   By: Hart Robinsons M.D.   On: 02/26/2023 14:27   DG Knee Complete 4 Views Left Result Date: 02/26/2023 CLINICAL DATA:  Fall approximately 3 weeks ago. EXAM: LEFT KNEE - COMPLETE 4+ VIEW; LEFT TIBIA AND FIBULA - 2 VIEW COMPARISON:  None Available. FINDINGS: No evidence of fracture, dislocation, or significant joint effusion. Mild quadriceps enthesopathy at the superior patellar pole. Mild degenerative changes of the knee. Soft tissues are unremarkable. IMPRESSION: 1. No acute osseous abnormality. 2. Mild osteoarthritis. Electronically Signed   By: Hart Robinsons M.D.   On: 02/26/2023 10:02   DG Tibia/Fibula Left Result Date: 02/26/2023 CLINICAL DATA:  Fall approximately 3 weeks ago. EXAM: LEFT KNEE - COMPLETE 4+ VIEW; LEFT TIBIA AND FIBULA - 2 VIEW COMPARISON:  None Available. FINDINGS: No evidence of fracture, dislocation, or significant joint effusion. Mild quadriceps enthesopathy at the superior patellar pole. Mild degenerative changes of the knee. Soft tissues are unremarkable. IMPRESSION: 1. No acute osseous abnormality. 2. Mild osteoarthritis. Electronically Signed  By: Hart Robinsons M.D.   On: 02/26/2023 10:02   DG Chest 1 View Result Date: 02/26/2023 CLINICAL DATA:  Chest pain. EXAM: CHEST  1 VIEW COMPARISON:  Chest radiograph dated April 06, 2018. FINDINGS: The heart size and mediastinal contours are within normal limits. Mild left basilar linear scarring/atelectasis. A calcified granuloma is again noted in the left lower lobe. No focal consolidation, sizeable pleural effusion, or pneumothorax. No acute osseous abnormality. IMPRESSION: Mild left basilar linear scarring/atelectasis. Otherwise, no acute cardiopulmonary findings. Electronically Signed   By: Hart Robinsons M.D.   On: 02/26/2023 09:09    Procedures Procedures    Medications Ordered in ED Medications  methylPREDNISolone sodium succinate (SOLU-MEDROL) 40 mg/mL  injection 40 mg (40 mg Intravenous Given 02/26/23 0907)  diphenhydrAMINE (BENADRYL) capsule 50 mg (50 mg Oral Given 02/26/23 1201)    Or  diphenhydrAMINE (BENADRYL) injection 50 mg ( Intravenous See Alternative 02/26/23 1201)  iohexol (OMNIPAQUE) 350 MG/ML injection 75 mL (75 mLs Intravenous Contrast Given 02/26/23 1355)  morphine (PF) 4 MG/ML injection 4 mg (4 mg Intravenous Given 02/26/23 1526)  ondansetron (ZOFRAN) injection 4 mg (4 mg Intravenous Given 02/26/23 1522)    ED Course/ Medical Decision Making/ A&P Clinical Course as of 02/26/23 1552  Tue Feb 26, 2023  1544 LLE pain, pending DVT US. Dc with thinners if uncompl DVT. No contraindications for DOAC. If neg can see Dr.Murphy with Ortho.  [BH]    Clinical Course User Index [BH] Henderly, Britni A, PA-C                                 Medical Decision Making Amount and/or Complexity of Data Reviewed Radiology: ordered.  Risk Prescription drug management.   Medical Decision Making / ED Course   This patient presents to the ED for concern of left leg pain, this involves an extensive number of treatment options, and is a complaint that carries with it a high risk of complications and morbidity.  The differential diagnosis includes DVT, fracture, sprain, contusion  MDM: 73 year old female presents today for concern of left leg pain and swelling.  History of DVT.  Concern for DVT again.  Does have some shortness of breath.  PE study negative.  X-rays without acute concern.  DVT study pending at the end of my shift.  Signed out to oncoming provider.  If this is negative patient to follow-up with orthopedist.  She is aware of this plan.   Lab Tests: -I ordered, reviewed, and interpreted labs.   The pertinent results include:   Labs Reviewed  CBC WITH DIFFERENTIAL/PLATELET - Abnormal; Notable for the following components:      Result Value   RBC 3.48 (*)    Hemoglobin 11.5 (*)    HCT 34.3 (*)    All other components  within normal limits  COMPREHENSIVE METABOLIC PANEL - Abnormal; Notable for the following components:   Glucose, Bld 122 (*)    Creatinine, Ser 1.10 (*)    AST 44 (*)    ALT 48 (*)    GFR, Estimated 54 (*)    All other components within normal limits  TROPONIN I (HIGH SENSITIVITY)  TROPONIN I (HIGH SENSITIVITY)      EKG  EKG Interpretation Date/Time:    Ventricular Rate:    PR Interval:    QRS Duration:    QT Interval:    QTC Calculation:   R Axis:  Text Interpretation:           Imaging Studies ordered: I ordered imaging studies including CT angio chest PE study, left knee x-ray, left tib-fib x-ray, DVT study and left lower extremity I independently visualized and interpreted imaging. I agree with the radiologist interpretation   Medicines ordered and prescription drug management: Meds ordered this encounter  Medications   methylPREDNISolone sodium succinate (SOLU-MEDROL) 40 mg/mL injection 40 mg    IV methylprednisolone will be converted to either a q12h or q24h frequency with the same total daily dose (TDD).  Ordered Dose: 1 to 125 mg TDD; convert to: TDD q24h.  Ordered Dose: 126 to 250 mg TDD; convert to: TDD div q12h.  Ordered Dose: >250 mg TDD; DAW.   OR Linked Order Group    diphenhydrAMINE (BENADRYL) capsule 50 mg    diphenhydrAMINE (BENADRYL) injection 50 mg   iohexol (OMNIPAQUE) 350 MG/ML injection 75 mL   morphine (PF) 4 MG/ML injection 4 mg   ondansetron (ZOFRAN) injection 4 mg    -I have reviewed the patients home medicines and have made adjustments as needed   Reevaluation: After the interventions noted above, I reevaluated the patient and found that they have :improved  Co morbidities that complicate the patient evaluation  Past Medical History:  Diagnosis Date   Diabetes mellitus without complication (HCC)    DVT (deep venous thrombosis) (HCC) 2017   GAD (generalized anxiety disorder)    Gout    Hypertension    Hypothyroidism     Panic attacks    Sciatica    Thyroid disease       Dispostion: Signed out to oncoming provider to follow-up on DVT study.  Final Clinical Impression(s) / ED Diagnoses Final diagnoses:  Left leg pain    Rx / DC Orders ED Discharge Orders     None         Marita Kansas, PA-C 02/26/23 1555    Ernie Avena, MD 02/26/23 [phone]

## 2023-02-26 NOTE — ED Notes (Signed)
Patient to xray at this time

## 2023-02-26 NOTE — ED Triage Notes (Addendum)
Pt. Stated, I think I have a blood clot again in my left leg and calf pain. I fell about 3 weeks ago due to light being off in the bathroom.  Yesterday I couldn't even walk or bend my leg. I get SOB just when Im getting up

## 2023-02-26 NOTE — ED Notes (Signed)
CT made aware that patient has been pre-medicated and will be ready for CT in approximately 1 hour.

## 2023-02-26 NOTE — Progress Notes (Signed)
LLE venous duplex has been completed.  Preliminary results given to Britni Henderly, PA-C.   Results can be found under chart review under CV PROC. 02/26/2023 4:21 PM Martice Doty RVT, RDMS

## 2023-02-26 NOTE — ED Provider Notes (Signed)
Patient seen by previous provider.  See note for full HPI.  Examination left lower extremity pain over the last few weeks.  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  She denies any chest pain, shortness of breath. Provider provider had ordered labs, imaging.  She had CTA which does not show any significant pathology.  Labs without significant finding.  Patient was handed off pending ultrasound DVT.  If negative DC with close follow-up outpatient  She denies any fever, nausea, vomiting, back pain, numbness, weakness.  No change in color to legs.  She is ambulatory.    Physical Exam  BP (!) 166/85   Pulse 87   Temp 98.2 F (36.8 C) (Oral)   Resp 20   Ht 5\' 5"  (1.651 m)   Wt 93 kg   SpO2 94%   BMI 34.11 kg/m   Physical Exam Vitals and nursing note reviewed.  Constitutional:      General: She is not in acute distress.    Appearance: She is well-developed. She is not ill-appearing.  HENT:     Head: Atraumatic.  Eyes:     Pupils: Pupils are equal, round, and reactive to light.  Cardiovascular:     Rate and Rhythm: Normal rate.     Pulses: Normal pulses.          Radial pulses are 2+ on the right side and 2+ on the left side.       Dorsalis pedis pulses are 2+ on the right side and 2+ on the left side.  Pulmonary:     Effort: No respiratory distress.  Abdominal:     General: Bowel sounds are normal. There is no distension.     Palpations: Abdomen is soft.     Tenderness: There is no abdominal tenderness.     Comments: Soft, nontender, no midline pulsatile abdominal mass  Musculoskeletal:        General: Normal range of motion.     Cervical back: Normal range of motion.     Comments: Possible slight soft tissue swelling difference left leg greater than right.  No erythema, warmth.  Her compartments are soft.  She has no bony tenderness, she has full range of motion.  Skin:    General: Skin is warm and dry.     Capillary Refill:  Capillary refill takes less than 2 seconds.     Comments: No rashes, lesions  Neurological:     General: No focal deficit present.     Mental Status: She is alert.     Sensory: Sensation is intact.     Motor: Motor function is intact.     Comments: intact sensation Equal strength  Psychiatric:        Mood and Affect: Mood normal.     Procedures  Procedures Labs Reviewed  CBC WITH DIFFERENTIAL/PLATELET - Abnormal; Notable for the following components:      Result Value   RBC 3.48 (*)    Hemoglobin 11.5 (*)    HCT 34.3 (*)    All other components within normal limits  COMPREHENSIVE METABOLIC PANEL - Abnormal; Notable for the following components:   Glucose, Bld 122 (*)    Creatinine, Ser 1.10 (*)    AST 44 (*)    ALT 48 (*)    GFR, Estimated 54 (*)    All other components within normal limits  TROPONIN I (HIGH SENSITIVITY)  TROPONIN I (HIGH SENSITIVITY)   VAS [place]  LOWER EXTREMITY VENOUS (DVT) (7a-7p) Result Date: 02/26/2023  Lower Venous DVT Study Patient Name:  Tiffany Cardenas  Date of Exam:   02/26/2023 Medical Rec #: [id number]        Accession #:    [id number] Date of Birth: May 11, 1950       Patient Gender: F Patient Age:   73 years Exam Location:  [facility] Procedure:      VAS [place] LOWER EXTREMITY VENOUS (DVT) Referring Phys: AMJAD ALI --------------------------------------------------------------------------------  Indications: Pain.  Risk Factors: DVT LLE (2016). Limitations: Poor ultrasound/tissue interface. Comparison Study: Previous LLEV exam on 08/01/18 was negative for DVT. Performing Technologist: Ernestene Mention RVT, RDMS  Examination Guidelines: A complete evaluation includes B-mode imaging, spectral Doppler, color Doppler, and power Doppler as needed of all accessible portions of each vessel. Bilateral testing is considered an integral part of a complete examination. Limited examinations for reoccurring indications may be performed as noted. The reflux portion of the exam  is performed with the patient in reverse Trendelenburg.  +-----+---------------+---------+-----------+----------+--------------+ RIGHTCompressibilityPhasicitySpontaneityPropertiesThrombus Aging +-----+---------------+---------+-----------+----------+--------------+ CFV  Full           Yes      Yes                                 +-----+---------------+---------+-----------+----------+--------------+   +--------+---------------+---------+-----------+----------+--------------------+ LEFT    CompressibilityPhasicitySpontaneityPropertiesThrombus Aging       +--------+---------------+---------+-----------+----------+--------------------+ CFV     Full           Yes      Yes                                       +--------+---------------+---------+-----------+----------+--------------------+ SFJ     Full                                                              +--------+---------------+---------+-----------+----------+--------------------+ FV Prox Full           Yes      Yes                                       +--------+---------------+---------+-----------+----------+--------------------+ FV Mid  Full           Yes      Yes                                       +--------+---------------+---------+-----------+----------+--------------------+ FV      Full           Yes      Yes                                       Distal                                                                    +--------+---------------+---------+-----------+----------+--------------------+  PFV                    Yes      Yes                  patent by                                                                 color/doppler        +--------+---------------+---------+-----------+----------+--------------------+ POP     Full           Yes      Yes                                        +--------+---------------+---------+-----------+----------+--------------------+ PTV     Full                                                              +--------+---------------+---------+-----------+----------+--------------------+ PERO    Full                                                              +--------+---------------+---------+-----------+----------+--------------------+    Summary: RIGHT: - No evidence of common femoral vein obstruction.   LEFT: - There is no evidence of deep vein thrombosis in the lower extremity.  - No cystic structure found in the popliteal fossa.  *See table(s) above for measurements and observations.    Preliminary    CT Angio Chest PE W and/or Wo Contrast Result Date: 02/26/2023 CLINICAL DATA:  Pulmonary embolism (PE) suspected, high prob EXAM: CT ANGIOGRAPHY CHEST WITH CONTRAST TECHNIQUE: Multidetector CT imaging of the chest was performed using the standard protocol during bolus administration of intravenous contrast. Multiplanar CT image reconstructions and MIPs were obtained to evaluate the vascular anatomy. RADIATION DOSE REDUCTION: This exam was performed according to the departmental dose-optimization program which includes automated exposure control, adjustment of the mA and/or kV according to patient size and/or use of iterative reconstruction technique. CONTRAST:  75mL OMNIPAQUE IOHEXOL 350 MG/ML SOLN COMPARISON:  CTA chest dated April 06, 2018. FINDINGS: Cardiovascular: Satisfactory opacification of the pulmonary arteries to the segmental level. No evidence of pulmonary embolism. Normal heart size. No pericardial effusion. Atherosclerotic calcification of the thoracic aorta and arch branch vessels. Mediastinum/Nodes: No enlarged mediastinal, hilar, or axillary lymph nodes. Trachea and esophagus demonstrate no significant findings. Lungs/Pleura: Mild bibasilar atelectasis. Large calcified granuloma again noted in the left lower lobe. No focal  consolidation, pleural effusion, or pneumothorax. No suspicious pulmonary nodule. Upper Abdomen: No acute abnormality. Musculoskeletal: No chest wall abnormality. No acute osseous abnormality. No suspicious osseous lesion. Multilevel degenerative changes of the thoracic spine. Review of the MIP images confirms the above findings. IMPRESSION: 1. No evidence of pulmonary embolism. 2. No acute intrathoracic findings.  3.  Aortic Atherosclerosis (ICD10-I70.0). Electronically Signed   By: Hart Robinsons M.D.   On: 02/26/2023 14:27   DG Knee Complete 4 Views Left Result Date: 02/26/2023 CLINICAL DATA:  Fall approximately 3 weeks ago. EXAM: LEFT KNEE - COMPLETE 4+ VIEW; LEFT TIBIA AND FIBULA - 2 VIEW COMPARISON:  None Available. FINDINGS: No evidence of fracture, dislocation, or significant joint effusion. Mild quadriceps enthesopathy at the superior patellar pole. Mild degenerative changes of the knee. Soft tissues are unremarkable. IMPRESSION: 1. No acute osseous abnormality. 2. Mild osteoarthritis. Electronically Signed   By: Hart Robinsons M.D.   On: 02/26/2023 10:02   DG Tibia/Fibula Left Result Date: 02/26/2023 CLINICAL DATA:  Fall approximately 3 weeks ago. EXAM: LEFT KNEE - COMPLETE 4+ VIEW; LEFT TIBIA AND FIBULA - 2 VIEW COMPARISON:  None Available. FINDINGS: No evidence of fracture, dislocation, or significant joint effusion. Mild quadriceps enthesopathy at the superior patellar pole. Mild degenerative changes of the knee. Soft tissues are unremarkable. IMPRESSION: 1. No acute osseous abnormality. 2. Mild osteoarthritis. Electronically Signed   By: Hart Robinsons M.D.   On: 02/26/2023 10:02   DG Chest 1 View Result Date: 02/26/2023 CLINICAL DATA:  Chest pain. EXAM: CHEST  1 VIEW COMPARISON:  Chest radiograph dated April 06, 2018. FINDINGS: The heart size and mediastinal contours are within normal limits. Mild left basilar linear scarring/atelectasis. A calcified granuloma is again noted in the left  lower lobe. No focal consolidation, sizeable pleural effusion, or pneumothorax. No acute osseous abnormality. IMPRESSION: Mild left basilar linear scarring/atelectasis. Otherwise, no acute cardiopulmonary findings. Electronically Signed   By: Hart Robinsons M.D.   On: 02/26/2023 09:09    ED Course / MDM   Clinical Course as of 02/26/23 1643  Tue Feb 26, 2023  1544 LLE pain, pending DVT US. Dc with thinners if uncompl DVT. No contraindications for DOAC. If neg can see Dr.Murphy with Ortho.  [BH]    Clinical Course User Index [BH] Dosia Yodice A, PA-C   Patient seen by previous provider.  See note for full HPI.  Examination left lower extremity pain over the last few weeks.  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  She denies any chest pain, shortness of breath. Provider provider had ordered labs, imaging.  She had CTA which does not show any significant pathology.  Labs without significant finding.  Patient was handed off pending ultrasound DVT.  If negative DC with close follow-up outpatient  She denies any fever, nausea, vomiting, back pain, numbness, weakness.  No change in color to legs.  She is ambulatory.   Labs and imaging personally viewed interpreted  Ultrasound negative for DVT  Discussed results with patient.  She is neurovascularly intact.  At this time I low suspicion for acute VTE, ischemia, occult fracture, dislocation, septic joint, necrotizing infection, cellulitis, abscess, radicular symptoms from lower back pain, AAA, dissection.  Follow-up outpatient, return for new or worsening symptoms.  Ambulatory here   Medical Decision Making Amount and/or Complexity of Data Reviewed External Data Reviewed: labs, radiology, ECG and notes. Labs: ordered. Decision-making details documented in ED Course. Radiology: ordered and independent interpretation performed. Decision-making details documented in ED Course. ECG/medicine  tests: ordered and independent interpretation performed. Decision-making details documented in ED Course.  Risk OTC drugs. Prescription drug management. Decision regarding hospitalization. Diagnosis or treatment significantly limited by social determinants of health.          Nijel Flink A, PA-C  02/26/23 1644    Rondel Baton, MD 02/27/23 1336

## 2023-02-26 NOTE — ED Notes (Signed)
Patient to vascular at this time.
